# Patient Record
Sex: Female | Born: 1935
Health system: Southern US, Community
[De-identification: ages and names within clinical notes are randomized; demographics above are authoritative.]

## PROBLEM LIST (undated history)

## (undated) DIAGNOSIS — Z803 Family history of malignant neoplasm of breast: Secondary | ICD-10-CM

## (undated) DIAGNOSIS — R0600 Dyspnea, unspecified: Secondary | ICD-10-CM

## (undated) DIAGNOSIS — N182 Chronic kidney disease, stage 2 (mild): Secondary | ICD-10-CM

## (undated) DIAGNOSIS — I10 Essential (primary) hypertension: Secondary | ICD-10-CM

## (undated) DIAGNOSIS — M8580 Other specified disorders of bone density and structure, unspecified site: Secondary | ICD-10-CM

## (undated) DIAGNOSIS — I6529 Occlusion and stenosis of unspecified carotid artery: Secondary | ICD-10-CM

## (undated) DIAGNOSIS — Z8042 Family history of malignant neoplasm of prostate: Secondary | ICD-10-CM

## (undated) DIAGNOSIS — N318 Other neuromuscular dysfunction of bladder: Secondary | ICD-10-CM

## (undated) DIAGNOSIS — M19049 Primary osteoarthritis, unspecified hand: Secondary | ICD-10-CM

## (undated) DIAGNOSIS — I4892 Unspecified atrial flutter: Secondary | ICD-10-CM

## (undated) DIAGNOSIS — G4700 Insomnia, unspecified: Secondary | ICD-10-CM

## (undated) DIAGNOSIS — E7800 Pure hypercholesterolemia, unspecified: Secondary | ICD-10-CM

## (undated) DIAGNOSIS — N3281 Overactive bladder: Secondary | ICD-10-CM

## (undated) DIAGNOSIS — Z7901 Long term (current) use of anticoagulants: Secondary | ICD-10-CM

## (undated) DIAGNOSIS — K5730 Diverticulosis of large intestine without perforation or abscess without bleeding: Secondary | ICD-10-CM

## (undated) DIAGNOSIS — F419 Anxiety disorder, unspecified: Secondary | ICD-10-CM

## (undated) DIAGNOSIS — I340 Nonrheumatic mitral (valve) insufficiency: Secondary | ICD-10-CM

## (undated) HISTORY — DX: Occlusion and stenosis of unspecified carotid artery: I65.29

## (undated) HISTORY — DX: Nonrheumatic mitral (valve) insufficiency: I34.0

## (undated) HISTORY — DX: Long term (current) use of anticoagulants: Z79.01

## (undated) HISTORY — DX: Anxiety disorder, unspecified: F41.9

## (undated) HISTORY — DX: Family history of malignant neoplasm of breast: Z80.3

## (undated) HISTORY — PX: BREAST BIOPSY: SHX20

## (undated) HISTORY — DX: Essential (primary) hypertension: I10

## (undated) HISTORY — DX: Unspecified atrial flutter: I48.92

## (undated) HISTORY — DX: Overactive bladder: N32.81

## (undated) HISTORY — DX: Chronic kidney disease, stage 2 (mild): N18.2

## (undated) HISTORY — DX: Pure hypercholesterolemia, unspecified: E78.00

## (undated) HISTORY — DX: Other specified disorders of bone density and structure, unspecified site: M85.80

## (undated) HISTORY — PX: BREAST LUMPECTOMY: SHX2

## (undated) HISTORY — DX: Insomnia, unspecified: G47.00

## (undated) HISTORY — DX: Family history of malignant neoplasm of prostate: Z80.42

## (undated) HISTORY — DX: Primary osteoarthritis, unspecified hand: M19.049

## (undated) HISTORY — PX: CATARACT EXTRACTION W/ INTRAOCULAR LENS  IMPLANT, BILATERAL: SHX1307

## (undated) HISTORY — DX: Other neuromuscular dysfunction of bladder: N31.8

## (undated) HISTORY — DX: Diverticulosis of large intestine without perforation or abscess without bleeding: K57.30

---

## 1999-02-02 HISTORY — PX: COLONOSCOPY: SHX174

## 1999-05-07 ENCOUNTER — Encounter: Admission: RE | Admit: 1999-05-07 | Discharge: 1999-05-07 | Payer: Self-pay | Admitting: Family Medicine

## 1999-05-07 ENCOUNTER — Encounter: Payer: Self-pay | Admitting: Family Medicine

## 2000-05-12 ENCOUNTER — Encounter: Admission: RE | Admit: 2000-05-12 | Discharge: 2000-05-12 | Payer: Self-pay | Admitting: Family Medicine

## 2000-05-12 ENCOUNTER — Encounter: Payer: Self-pay | Admitting: Family Medicine

## 2000-08-11 ENCOUNTER — Ambulatory Visit (HOSPITAL_COMMUNITY): Admission: RE | Admit: 2000-08-11 | Discharge: 2000-08-11 | Payer: Self-pay | Admitting: Gastroenterology

## 2001-04-11 ENCOUNTER — Other Ambulatory Visit: Admission: RE | Admit: 2001-04-11 | Discharge: 2001-04-11 | Payer: Self-pay | Admitting: Family Medicine

## 2001-05-15 ENCOUNTER — Encounter: Admission: RE | Admit: 2001-05-15 | Discharge: 2001-05-15 | Payer: Self-pay | Admitting: Family Medicine

## 2001-05-15 ENCOUNTER — Encounter: Payer: Self-pay | Admitting: Family Medicine

## 2002-06-20 ENCOUNTER — Other Ambulatory Visit: Admission: RE | Admit: 2002-06-20 | Discharge: 2002-06-20 | Payer: Self-pay | Admitting: Family Medicine

## 2002-07-03 ENCOUNTER — Encounter: Payer: Self-pay | Admitting: Family Medicine

## 2002-07-03 ENCOUNTER — Encounter: Admission: RE | Admit: 2002-07-03 | Discharge: 2002-07-03 | Payer: Self-pay | Admitting: Family Medicine

## 2003-07-19 ENCOUNTER — Encounter: Admission: RE | Admit: 2003-07-19 | Discharge: 2003-07-19 | Payer: Self-pay | Admitting: Family Medicine

## 2004-08-13 ENCOUNTER — Encounter: Admission: RE | Admit: 2004-08-13 | Discharge: 2004-08-13 | Payer: Self-pay | Admitting: Family Medicine

## 2005-08-30 ENCOUNTER — Other Ambulatory Visit: Admission: RE | Admit: 2005-08-30 | Discharge: 2005-08-30 | Payer: Self-pay | Admitting: Family Medicine

## 2005-09-15 ENCOUNTER — Encounter: Admission: RE | Admit: 2005-09-15 | Discharge: 2005-09-15 | Payer: Self-pay | Admitting: Family Medicine

## 2006-09-21 ENCOUNTER — Encounter: Admission: RE | Admit: 2006-09-21 | Discharge: 2006-09-21 | Payer: Self-pay | Admitting: Family Medicine

## 2007-02-03 ENCOUNTER — Emergency Department (HOSPITAL_COMMUNITY): Admission: EM | Admit: 2007-02-03 | Discharge: 2007-02-03 | Payer: Self-pay | Admitting: Emergency Medicine

## 2007-10-04 ENCOUNTER — Encounter: Admission: RE | Admit: 2007-10-04 | Discharge: 2007-10-04 | Payer: Self-pay | Admitting: Family Medicine

## 2008-10-16 ENCOUNTER — Encounter: Admission: RE | Admit: 2008-10-16 | Discharge: 2008-10-16 | Payer: Self-pay | Admitting: Family Medicine

## 2009-10-30 ENCOUNTER — Encounter: Admission: RE | Admit: 2009-10-30 | Discharge: 2009-10-30 | Payer: Self-pay | Admitting: Family Medicine

## 2010-06-19 NOTE — Procedures (Signed)
[place]. [facility]  Patient:    Martha Edwards, Martha Edwards                         MRN: [id number] Proc. Date: 08/11/00 Adm. Date:  [id number] Attending:  Rich Brave CC:         Jethro Bastos, M.D.   Procedure Report  PROCEDURE PERFORMED:  Colonoscopy.  ENDOSCOPIST:  Florencia Reasons, M.D.  INDICATIONS FOR PROCEDURE:  The patient is a 77 year old female with transiently hemoccult positive stool.  FINDINGS:  Normal to the cecum except severe sigmoid diverticulosis.  DESCRIPTION OF PROCEDURE:  The nature, purpose and risks of the procedure had been discussed with the patient, who provided written consent.  The Olympus adjustable tension pediatric video colonoscope was advanced to the cecum without significant difficulty and pull-back was then performed.  The quality of the prep was very good and it is felt that all areas were well seen.  There was severe sigmoid diverticulosis and perhaps some scattered diverticular change elsewhere in the colon.  No polyps, cancer, colitis, vascular malformations were observed and retroflexion in the rectum was normal.  No biopsies were obtained.  The patient tolerated the procedure well and there were no apparent complications.  IMPRESSION:  Normal colonoscopy except for severe sigmoid diverticulosis.  No source of transient hemoccult positivity endoscopically evident.  Note that the patient had possibly been on Advil around the time of her home Hemoccults, one of which was positive.  PLAN:  No further evaluation needed.  Screening flexible sigmoidoscopy in five years. DD:  08/11/00 TD:  08/11/00 Job: [id number]

## 2010-10-21 ENCOUNTER — Other Ambulatory Visit: Payer: Self-pay | Admitting: Family Medicine

## 2010-10-21 DIAGNOSIS — Z1231 Encounter for screening mammogram for malignant neoplasm of breast: Secondary | ICD-10-CM

## 2010-11-12 ENCOUNTER — Ambulatory Visit
Admission: RE | Admit: 2010-11-12 | Discharge: 2010-11-12 | Disposition: A | Discharge status: Home / Self Care | Payer: Medicare Other | Source: Ambulatory Visit | Attending: Family Medicine | Admitting: Family Medicine

## 2010-11-12 DIAGNOSIS — Z1231 Encounter for screening mammogram for malignant neoplasm of breast: Secondary | ICD-10-CM

## 2011-02-15 DIAGNOSIS — E785 Hyperlipidemia, unspecified: Secondary | ICD-10-CM | POA: Diagnosis not present

## 2011-02-15 DIAGNOSIS — Z79899 Other long term (current) drug therapy: Secondary | ICD-10-CM | POA: Diagnosis not present

## 2011-02-15 DIAGNOSIS — N318 Other neuromuscular dysfunction of bladder: Secondary | ICD-10-CM | POA: Diagnosis not present

## 2011-02-15 DIAGNOSIS — Z0000 Encounter for general adult medical examination without abnormal findings: Secondary | ICD-10-CM | POA: Diagnosis not present

## 2011-02-15 DIAGNOSIS — E559 Vitamin D deficiency, unspecified: Secondary | ICD-10-CM | POA: Diagnosis not present

## 2011-02-15 DIAGNOSIS — B370 Candidal stomatitis: Secondary | ICD-10-CM | POA: Diagnosis not present

## 2011-02-15 DIAGNOSIS — M810 Age-related osteoporosis without current pathological fracture: Secondary | ICD-10-CM | POA: Diagnosis not present

## 2011-02-15 DIAGNOSIS — I10 Essential (primary) hypertension: Secondary | ICD-10-CM | POA: Diagnosis not present

## 2011-03-04 DIAGNOSIS — H40019 Open angle with borderline findings, low risk, unspecified eye: Secondary | ICD-10-CM | POA: Diagnosis not present

## 2011-03-18 DIAGNOSIS — M79609 Pain in unspecified limb: Secondary | ICD-10-CM | POA: Diagnosis not present

## 2011-03-18 DIAGNOSIS — IMO0001 Reserved for inherently not codable concepts without codable children: Secondary | ICD-10-CM | POA: Diagnosis not present

## 2011-03-19 ENCOUNTER — Ambulatory Visit
Admission: RE | Admit: 2011-03-19 | Discharge: 2011-03-19 | Disposition: A | Discharge status: Home / Self Care | Payer: Medicare Other | Source: Ambulatory Visit | Attending: Family Medicine | Admitting: Family Medicine

## 2011-03-19 ENCOUNTER — Other Ambulatory Visit: Payer: Self-pay | Admitting: Family Medicine

## 2011-03-19 DIAGNOSIS — M169 Osteoarthritis of hip, unspecified: Secondary | ICD-10-CM | POA: Diagnosis not present

## 2011-03-19 DIAGNOSIS — M79609 Pain in unspecified limb: Secondary | ICD-10-CM

## 2011-03-19 DIAGNOSIS — M25569 Pain in unspecified knee: Secondary | ICD-10-CM | POA: Diagnosis not present

## 2011-04-02 DIAGNOSIS — H5210 Myopia, unspecified eye: Secondary | ICD-10-CM | POA: Diagnosis not present

## 2011-04-02 DIAGNOSIS — H35349 Macular cyst, hole, or pseudohole, unspecified eye: Secondary | ICD-10-CM | POA: Diagnosis not present

## 2011-04-02 DIAGNOSIS — H40059 Ocular hypertension, unspecified eye: Secondary | ICD-10-CM | POA: Diagnosis not present

## 2011-04-02 DIAGNOSIS — H25019 Cortical age-related cataract, unspecified eye: Secondary | ICD-10-CM | POA: Diagnosis not present

## 2011-04-05 DIAGNOSIS — M129 Arthropathy, unspecified: Secondary | ICD-10-CM | POA: Diagnosis not present

## 2011-04-13 DIAGNOSIS — H2589 Other age-related cataract: Secondary | ICD-10-CM | POA: Diagnosis not present

## 2011-04-13 DIAGNOSIS — H25019 Cortical age-related cataract, unspecified eye: Secondary | ICD-10-CM | POA: Diagnosis not present

## 2011-04-13 DIAGNOSIS — H25049 Posterior subcapsular polar age-related cataract, unspecified eye: Secondary | ICD-10-CM | POA: Diagnosis not present

## 2011-04-13 DIAGNOSIS — H2510 Age-related nuclear cataract, unspecified eye: Secondary | ICD-10-CM | POA: Diagnosis not present

## 2011-06-01 DIAGNOSIS — Z1211 Encounter for screening for malignant neoplasm of colon: Secondary | ICD-10-CM | POA: Diagnosis not present

## 2011-06-01 DIAGNOSIS — D126 Benign neoplasm of colon, unspecified: Secondary | ICD-10-CM | POA: Diagnosis not present

## 2011-06-02 DIAGNOSIS — I4892 Unspecified atrial flutter: Secondary | ICD-10-CM | POA: Diagnosis not present

## 2011-06-03 DIAGNOSIS — I4892 Unspecified atrial flutter: Secondary | ICD-10-CM | POA: Diagnosis not present

## 2011-06-09 DIAGNOSIS — I10 Essential (primary) hypertension: Secondary | ICD-10-CM | POA: Diagnosis not present

## 2011-06-09 DIAGNOSIS — I4892 Unspecified atrial flutter: Secondary | ICD-10-CM | POA: Diagnosis not present

## 2011-06-09 DIAGNOSIS — E785 Hyperlipidemia, unspecified: Secondary | ICD-10-CM | POA: Diagnosis not present

## 2011-06-09 DIAGNOSIS — Z7901 Long term (current) use of anticoagulants: Secondary | ICD-10-CM | POA: Diagnosis not present

## 2011-06-10 DIAGNOSIS — I4892 Unspecified atrial flutter: Secondary | ICD-10-CM | POA: Diagnosis not present

## 2011-06-14 DIAGNOSIS — Z7901 Long term (current) use of anticoagulants: Secondary | ICD-10-CM | POA: Diagnosis not present

## 2011-06-14 DIAGNOSIS — I4892 Unspecified atrial flutter: Secondary | ICD-10-CM | POA: Diagnosis not present

## 2011-06-15 DIAGNOSIS — IMO0002 Reserved for concepts with insufficient information to code with codable children: Secondary | ICD-10-CM | POA: Diagnosis not present

## 2011-06-15 DIAGNOSIS — H2510 Age-related nuclear cataract, unspecified eye: Secondary | ICD-10-CM | POA: Diagnosis not present

## 2011-06-15 DIAGNOSIS — H25019 Cortical age-related cataract, unspecified eye: Secondary | ICD-10-CM | POA: Diagnosis not present

## 2011-06-18 DIAGNOSIS — Z7901 Long term (current) use of anticoagulants: Secondary | ICD-10-CM | POA: Diagnosis not present

## 2011-06-18 DIAGNOSIS — I4892 Unspecified atrial flutter: Secondary | ICD-10-CM | POA: Diagnosis not present

## 2011-06-22 DIAGNOSIS — I10 Essential (primary) hypertension: Secondary | ICD-10-CM | POA: Diagnosis not present

## 2011-06-22 DIAGNOSIS — E785 Hyperlipidemia, unspecified: Secondary | ICD-10-CM | POA: Diagnosis not present

## 2011-06-22 DIAGNOSIS — F411 Generalized anxiety disorder: Secondary | ICD-10-CM | POA: Diagnosis not present

## 2011-06-22 DIAGNOSIS — Z7901 Long term (current) use of anticoagulants: Secondary | ICD-10-CM | POA: Diagnosis not present

## 2011-06-22 DIAGNOSIS — I059 Rheumatic mitral valve disease, unspecified: Secondary | ICD-10-CM | POA: Diagnosis not present

## 2011-06-22 DIAGNOSIS — I4892 Unspecified atrial flutter: Secondary | ICD-10-CM | POA: Diagnosis not present

## 2011-06-30 DIAGNOSIS — I4892 Unspecified atrial flutter: Secondary | ICD-10-CM | POA: Diagnosis not present

## 2011-06-30 DIAGNOSIS — Z7901 Long term (current) use of anticoagulants: Secondary | ICD-10-CM | POA: Diagnosis not present

## 2011-07-15 DIAGNOSIS — Z7901 Long term (current) use of anticoagulants: Secondary | ICD-10-CM | POA: Diagnosis not present

## 2011-07-15 DIAGNOSIS — I4892 Unspecified atrial flutter: Secondary | ICD-10-CM | POA: Diagnosis not present

## 2011-08-11 DIAGNOSIS — Z7901 Long term (current) use of anticoagulants: Secondary | ICD-10-CM | POA: Diagnosis not present

## 2011-08-11 DIAGNOSIS — I4892 Unspecified atrial flutter: Secondary | ICD-10-CM | POA: Diagnosis not present

## 2011-08-16 DIAGNOSIS — E785 Hyperlipidemia, unspecified: Secondary | ICD-10-CM | POA: Diagnosis not present

## 2011-08-16 DIAGNOSIS — M19049 Primary osteoarthritis, unspecified hand: Secondary | ICD-10-CM | POA: Diagnosis not present

## 2011-08-16 DIAGNOSIS — I10 Essential (primary) hypertension: Secondary | ICD-10-CM | POA: Diagnosis not present

## 2011-09-15 DIAGNOSIS — I4892 Unspecified atrial flutter: Secondary | ICD-10-CM | POA: Diagnosis not present

## 2011-09-15 DIAGNOSIS — Z7901 Long term (current) use of anticoagulants: Secondary | ICD-10-CM | POA: Diagnosis not present

## 2011-09-29 DIAGNOSIS — Z7901 Long term (current) use of anticoagulants: Secondary | ICD-10-CM | POA: Diagnosis not present

## 2011-09-29 DIAGNOSIS — I4892 Unspecified atrial flutter: Secondary | ICD-10-CM | POA: Diagnosis not present

## 2011-10-13 DIAGNOSIS — E7800 Pure hypercholesterolemia, unspecified: Secondary | ICD-10-CM | POA: Diagnosis not present

## 2011-10-13 DIAGNOSIS — E785 Hyperlipidemia, unspecified: Secondary | ICD-10-CM | POA: Diagnosis not present

## 2011-10-13 DIAGNOSIS — Z79899 Other long term (current) drug therapy: Secondary | ICD-10-CM | POA: Diagnosis not present

## 2011-10-19 DIAGNOSIS — Z7901 Long term (current) use of anticoagulants: Secondary | ICD-10-CM | POA: Diagnosis not present

## 2011-10-19 DIAGNOSIS — I10 Essential (primary) hypertension: Secondary | ICD-10-CM | POA: Diagnosis not present

## 2011-10-19 DIAGNOSIS — I059 Rheumatic mitral valve disease, unspecified: Secondary | ICD-10-CM | POA: Diagnosis not present

## 2011-10-19 DIAGNOSIS — I495 Sick sinus syndrome: Secondary | ICD-10-CM | POA: Diagnosis not present

## 2011-10-19 DIAGNOSIS — I4892 Unspecified atrial flutter: Secondary | ICD-10-CM | POA: Diagnosis not present

## 2011-11-16 DIAGNOSIS — Z7901 Long term (current) use of anticoagulants: Secondary | ICD-10-CM | POA: Diagnosis not present

## 2011-11-16 DIAGNOSIS — I4892 Unspecified atrial flutter: Secondary | ICD-10-CM | POA: Diagnosis not present

## 2011-11-22 ENCOUNTER — Other Ambulatory Visit: Payer: Self-pay | Admitting: Family Medicine

## 2011-11-22 DIAGNOSIS — Z1231 Encounter for screening mammogram for malignant neoplasm of breast: Secondary | ICD-10-CM

## 2011-12-14 DIAGNOSIS — I4892 Unspecified atrial flutter: Secondary | ICD-10-CM | POA: Diagnosis not present

## 2011-12-14 DIAGNOSIS — Z7901 Long term (current) use of anticoagulants: Secondary | ICD-10-CM | POA: Diagnosis not present

## 2011-12-23 ENCOUNTER — Ambulatory Visit
Admission: RE | Admit: 2011-12-23 | Discharge: 2011-12-23 | Disposition: A | Discharge status: Home / Self Care | Payer: Medicare Other | Source: Ambulatory Visit | Attending: Family Medicine | Admitting: Family Medicine

## 2011-12-23 DIAGNOSIS — Z1231 Encounter for screening mammogram for malignant neoplasm of breast: Secondary | ICD-10-CM

## 2012-01-03 DIAGNOSIS — Z7901 Long term (current) use of anticoagulants: Secondary | ICD-10-CM | POA: Diagnosis not present

## 2012-01-03 DIAGNOSIS — N390 Urinary tract infection, site not specified: Secondary | ICD-10-CM | POA: Diagnosis not present

## 2012-01-03 DIAGNOSIS — R319 Hematuria, unspecified: Secondary | ICD-10-CM | POA: Diagnosis not present

## 2012-01-10 DIAGNOSIS — Z7901 Long term (current) use of anticoagulants: Secondary | ICD-10-CM | POA: Diagnosis not present

## 2012-01-24 DIAGNOSIS — Z7901 Long term (current) use of anticoagulants: Secondary | ICD-10-CM | POA: Diagnosis not present

## 2012-01-24 DIAGNOSIS — I4892 Unspecified atrial flutter: Secondary | ICD-10-CM | POA: Diagnosis not present

## 2012-02-21 DIAGNOSIS — M810 Age-related osteoporosis without current pathological fracture: Secondary | ICD-10-CM | POA: Diagnosis not present

## 2012-02-21 DIAGNOSIS — I4892 Unspecified atrial flutter: Secondary | ICD-10-CM | POA: Diagnosis not present

## 2012-02-21 DIAGNOSIS — N318 Other neuromuscular dysfunction of bladder: Secondary | ICD-10-CM | POA: Diagnosis not present

## 2012-02-21 DIAGNOSIS — Z7901 Long term (current) use of anticoagulants: Secondary | ICD-10-CM | POA: Diagnosis not present

## 2012-02-21 DIAGNOSIS — Z79899 Other long term (current) drug therapy: Secondary | ICD-10-CM | POA: Diagnosis not present

## 2012-02-21 DIAGNOSIS — Z0000 Encounter for general adult medical examination without abnormal findings: Secondary | ICD-10-CM | POA: Diagnosis not present

## 2012-02-21 DIAGNOSIS — I10 Essential (primary) hypertension: Secondary | ICD-10-CM | POA: Diagnosis not present

## 2012-02-21 DIAGNOSIS — E785 Hyperlipidemia, unspecified: Secondary | ICD-10-CM | POA: Diagnosis not present

## 2012-03-20 DIAGNOSIS — I4892 Unspecified atrial flutter: Secondary | ICD-10-CM | POA: Diagnosis not present

## 2012-03-20 DIAGNOSIS — Z7901 Long term (current) use of anticoagulants: Secondary | ICD-10-CM | POA: Diagnosis not present

## 2012-05-03 DIAGNOSIS — Z7901 Long term (current) use of anticoagulants: Secondary | ICD-10-CM | POA: Diagnosis not present

## 2012-05-03 DIAGNOSIS — I4892 Unspecified atrial flutter: Secondary | ICD-10-CM | POA: Diagnosis not present

## 2012-05-04 DIAGNOSIS — I059 Rheumatic mitral valve disease, unspecified: Secondary | ICD-10-CM | POA: Diagnosis not present

## 2012-05-04 DIAGNOSIS — I4892 Unspecified atrial flutter: Secondary | ICD-10-CM | POA: Diagnosis not present

## 2012-05-04 DIAGNOSIS — Z7901 Long term (current) use of anticoagulants: Secondary | ICD-10-CM | POA: Diagnosis not present

## 2012-05-31 DIAGNOSIS — Z7901 Long term (current) use of anticoagulants: Secondary | ICD-10-CM | POA: Diagnosis not present

## 2012-05-31 DIAGNOSIS — I4892 Unspecified atrial flutter: Secondary | ICD-10-CM | POA: Diagnosis not present

## 2012-06-28 DIAGNOSIS — I4892 Unspecified atrial flutter: Secondary | ICD-10-CM | POA: Diagnosis not present

## 2012-06-28 DIAGNOSIS — Z7901 Long term (current) use of anticoagulants: Secondary | ICD-10-CM | POA: Diagnosis not present

## 2012-07-14 DIAGNOSIS — H35349 Macular cyst, hole, or pseudohole, unspecified eye: Secondary | ICD-10-CM | POA: Diagnosis not present

## 2012-07-14 DIAGNOSIS — H40059 Ocular hypertension, unspecified eye: Secondary | ICD-10-CM | POA: Diagnosis not present

## 2012-07-14 DIAGNOSIS — Z961 Presence of intraocular lens: Secondary | ICD-10-CM | POA: Diagnosis not present

## 2012-07-20 DIAGNOSIS — R1013 Epigastric pain: Secondary | ICD-10-CM | POA: Diagnosis not present

## 2012-07-20 DIAGNOSIS — N390 Urinary tract infection, site not specified: Secondary | ICD-10-CM | POA: Diagnosis not present

## 2012-07-20 DIAGNOSIS — B370 Candidal stomatitis: Secondary | ICD-10-CM | POA: Diagnosis not present

## 2012-08-03 DIAGNOSIS — Z7901 Long term (current) use of anticoagulants: Secondary | ICD-10-CM | POA: Diagnosis not present

## 2012-08-03 DIAGNOSIS — I4892 Unspecified atrial flutter: Secondary | ICD-10-CM | POA: Diagnosis not present

## 2012-08-17 DIAGNOSIS — Z7901 Long term (current) use of anticoagulants: Secondary | ICD-10-CM | POA: Diagnosis not present

## 2012-08-17 DIAGNOSIS — I4892 Unspecified atrial flutter: Secondary | ICD-10-CM | POA: Diagnosis not present

## 2012-09-01 DIAGNOSIS — E785 Hyperlipidemia, unspecified: Secondary | ICD-10-CM | POA: Diagnosis not present

## 2012-09-01 DIAGNOSIS — M810 Age-related osteoporosis without current pathological fracture: Secondary | ICD-10-CM | POA: Diagnosis not present

## 2012-09-01 DIAGNOSIS — I10 Essential (primary) hypertension: Secondary | ICD-10-CM | POA: Diagnosis not present

## 2012-09-19 DIAGNOSIS — I4892 Unspecified atrial flutter: Secondary | ICD-10-CM | POA: Diagnosis not present

## 2012-09-19 DIAGNOSIS — Z7901 Long term (current) use of anticoagulants: Secondary | ICD-10-CM | POA: Diagnosis not present

## 2012-10-10 DIAGNOSIS — Z7901 Long term (current) use of anticoagulants: Secondary | ICD-10-CM | POA: Diagnosis not present

## 2012-10-10 DIAGNOSIS — I4892 Unspecified atrial flutter: Secondary | ICD-10-CM | POA: Diagnosis not present

## 2012-11-09 ENCOUNTER — Ambulatory Visit (INDEPENDENT_AMBULATORY_CARE_PROVIDER_SITE_OTHER): Payer: Medicare Other | Admitting: Pharmacist

## 2012-11-09 DIAGNOSIS — I4891 Unspecified atrial fibrillation: Secondary | ICD-10-CM | POA: Diagnosis not present

## 2012-11-23 ENCOUNTER — Other Ambulatory Visit: Payer: Self-pay

## 2012-11-23 DIAGNOSIS — Z1231 Encounter for screening mammogram for malignant neoplasm of breast: Secondary | ICD-10-CM

## 2012-12-21 ENCOUNTER — Ambulatory Visit (INDEPENDENT_AMBULATORY_CARE_PROVIDER_SITE_OTHER): Payer: Medicare Other | Admitting: Pharmacist

## 2012-12-21 DIAGNOSIS — I4891 Unspecified atrial fibrillation: Secondary | ICD-10-CM | POA: Diagnosis not present

## 2012-12-21 MED ORDER — WARFARIN SODIUM 4 MG PO TABS: ORAL_TABLET | ORAL | Status: DC

## 2012-12-25 ENCOUNTER — Ambulatory Visit
Admission: RE | Admit: 2012-12-25 | Discharge: 2012-12-25 | Disposition: A | Discharge status: Home / Self Care | Payer: Medicare Other | Source: Ambulatory Visit

## 2012-12-25 DIAGNOSIS — Z1231 Encounter for screening mammogram for malignant neoplasm of breast: Secondary | ICD-10-CM | POA: Diagnosis not present

## 2013-01-18 ENCOUNTER — Ambulatory Visit (INDEPENDENT_AMBULATORY_CARE_PROVIDER_SITE_OTHER): Payer: Medicare Other | Admitting: *Deleted

## 2013-01-18 DIAGNOSIS — I4891 Unspecified atrial fibrillation: Secondary | ICD-10-CM

## 2013-02-15 ENCOUNTER — Ambulatory Visit (INDEPENDENT_AMBULATORY_CARE_PROVIDER_SITE_OTHER): Payer: Medicare Other | Admitting: Pharmacist

## 2013-02-15 DIAGNOSIS — I4891 Unspecified atrial fibrillation: Secondary | ICD-10-CM

## 2013-02-15 LAB — POCT INR: INR: 2.9

## 2013-03-06 ENCOUNTER — Encounter: Payer: Self-pay | Admitting: Interventional Cardiology

## 2013-03-12 ENCOUNTER — Telehealth: Payer: Self-pay | Admitting: Pharmacist

## 2013-03-12 MED ORDER — WARFARIN SODIUM 4 MG PO TABS: ORAL_TABLET | ORAL | Status: DC

## 2013-03-12 NOTE — Telephone Encounter (Signed)
Refill for warfarin sent to [facility].

## 2013-03-22 ENCOUNTER — Encounter: Payer: Self-pay | Admitting: Interventional Cardiology

## 2013-04-02 DIAGNOSIS — Z79899 Other long term (current) drug therapy: Secondary | ICD-10-CM | POA: Diagnosis not present

## 2013-04-02 DIAGNOSIS — Z23 Encounter for immunization: Secondary | ICD-10-CM | POA: Diagnosis not present

## 2013-04-02 DIAGNOSIS — G479 Sleep disorder, unspecified: Secondary | ICD-10-CM | POA: Diagnosis not present

## 2013-04-02 DIAGNOSIS — Z0000 Encounter for general adult medical examination without abnormal findings: Secondary | ICD-10-CM | POA: Diagnosis not present

## 2013-04-02 DIAGNOSIS — I4892 Unspecified atrial flutter: Secondary | ICD-10-CM | POA: Diagnosis not present

## 2013-04-02 DIAGNOSIS — N182 Chronic kidney disease, stage 2 (mild): Secondary | ICD-10-CM | POA: Diagnosis not present

## 2013-04-02 DIAGNOSIS — N318 Other neuromuscular dysfunction of bladder: Secondary | ICD-10-CM | POA: Diagnosis not present

## 2013-04-02 DIAGNOSIS — E785 Hyperlipidemia, unspecified: Secondary | ICD-10-CM | POA: Diagnosis not present

## 2013-04-04 ENCOUNTER — Ambulatory Visit (INDEPENDENT_AMBULATORY_CARE_PROVIDER_SITE_OTHER): Payer: Medicare Other | Admitting: Pharmacist

## 2013-04-04 DIAGNOSIS — I4891 Unspecified atrial fibrillation: Secondary | ICD-10-CM

## 2013-04-04 LAB — POCT INR: INR: 2.2

## 2013-05-02 ENCOUNTER — Ambulatory Visit: Payer: Medicare Other | Admitting: Interventional Cardiology

## 2013-05-17 ENCOUNTER — Ambulatory Visit: Payer: Medicare Other | Admitting: Interventional Cardiology

## 2013-05-17 ENCOUNTER — Ambulatory Visit (INDEPENDENT_AMBULATORY_CARE_PROVIDER_SITE_OTHER): Payer: Medicare Other | Admitting: *Deleted

## 2013-05-17 DIAGNOSIS — I4891 Unspecified atrial fibrillation: Secondary | ICD-10-CM

## 2013-05-17 LAB — POCT INR: INR: 2.7

## 2013-06-28 ENCOUNTER — Ambulatory Visit (INDEPENDENT_AMBULATORY_CARE_PROVIDER_SITE_OTHER): Payer: Medicare Other | Admitting: Pharmacist Clinician (PhC)/ Clinical Pharmacy Specialist

## 2013-06-28 DIAGNOSIS — I4891 Unspecified atrial fibrillation: Secondary | ICD-10-CM

## 2013-06-28 LAB — POCT INR: INR: 2.9

## 2013-07-03 ENCOUNTER — Encounter: Payer: Self-pay | Admitting: Interventional Cardiology

## 2013-07-06 DIAGNOSIS — H35349 Macular cyst, hole, or pseudohole, unspecified eye: Secondary | ICD-10-CM | POA: Diagnosis not present

## 2013-07-06 DIAGNOSIS — Z961 Presence of intraocular lens: Secondary | ICD-10-CM | POA: Diagnosis not present

## 2013-07-19 ENCOUNTER — Ambulatory Visit: Payer: Medicare Other | Admitting: Interventional Cardiology

## 2013-08-02 DIAGNOSIS — N3000 Acute cystitis without hematuria: Secondary | ICD-10-CM | POA: Diagnosis not present

## 2013-08-02 DIAGNOSIS — R300 Dysuria: Secondary | ICD-10-CM | POA: Diagnosis not present

## 2013-08-09 ENCOUNTER — Ambulatory Visit (INDEPENDENT_AMBULATORY_CARE_PROVIDER_SITE_OTHER): Payer: Medicare Other | Admitting: Pharmacist

## 2013-08-09 DIAGNOSIS — I4891 Unspecified atrial fibrillation: Secondary | ICD-10-CM | POA: Diagnosis not present

## 2013-08-09 LAB — POCT INR: INR: 4.5

## 2013-08-20 ENCOUNTER — Ambulatory Visit (INDEPENDENT_AMBULATORY_CARE_PROVIDER_SITE_OTHER): Payer: Medicare Other

## 2013-08-20 DIAGNOSIS — I4891 Unspecified atrial fibrillation: Secondary | ICD-10-CM

## 2013-08-20 LAB — POCT INR: INR: 2.4

## 2013-08-28 ENCOUNTER — Encounter: Payer: Self-pay | Admitting: Interventional Cardiology

## 2013-08-28 ENCOUNTER — Ambulatory Visit (INDEPENDENT_AMBULATORY_CARE_PROVIDER_SITE_OTHER): Payer: Medicare Other | Admitting: Interventional Cardiology

## 2013-08-28 VITALS — BP 160/82 | HR 53 | Ht 67.0 in | Wt 151.0 lb

## 2013-08-28 DIAGNOSIS — Z7901 Long term (current) use of anticoagulants: Secondary | ICD-10-CM | POA: Diagnosis not present

## 2013-08-28 DIAGNOSIS — I10 Essential (primary) hypertension: Secondary | ICD-10-CM

## 2013-08-28 DIAGNOSIS — I4892 Unspecified atrial flutter: Secondary | ICD-10-CM

## 2013-08-28 NOTE — Progress Notes (Signed)
Patient ID: Martha Edwards, female   DOB: 06/10/1932, 80 y.o.   MRN: [id number]    [address] [place], [place]  [address] Phone: [phone] Fax:  [phone]  Date:  08/28/2013   ID:  Martha Edwards, DOB [birth year], MRN [id number]  PCP:  No primary provider on file.   ASSESSMENT:  1. Proximal atrial flutter, currently in sinus rhythm/sinus bradycardia 2. Chronic anticoagulation therapy without bleeding 3. Hypertension with borderline control  PLAN:  1. decrease salt in diet 2. Decrease weight 3. Clinical followup in 6-8 months  SUBJECTIVE: Martha Edwards is a 80 y.o. female who has no cardiovascular complaints. She has not had bleeding on Coumadin. No falls or head trauma. No transient neurological complaints. She denies dyspnea chest pain.   Wt Readings from Last 3 Encounters:  08/28/13 151 lb (68.493 kg)     Past Medical History  Diagnosis Date  . Anxiety   . Hypercholesterolemia   . Diverticula, colon   . Overactive bladder   . Osteopenia     > osteoporosis  . Hypertension     with LVH on echo 06/03/11  . Carotid artery plaque     mild bilateral carotid plaque without significant stenosis (<20% in left ICA), 04/2010  . Atrial flutter     with LVEF 60%, LVH, LAE and holter with rate control and Ave HR 60 bpm   . Hypertonicity of bladder   . Insomnia   . Anticoagulated   . Mitral regurgitation   . Arthritis of hand   . CKD (chronic kidney disease) stage 2, GFR 60-89 ml/min     Current Outpatient Prescriptions  Medication Sig Dispense Refill  . acetaminophen (TYLENOL) 325 MG tablet Take 650 mg by mouth as directed. Tablet 2 tablets as needed      . atorvastatin (LIPITOR) 40 MG tablet Take 40 mg by mouth daily.      . Cholecalciferol (VITAMIN D PO) Take by mouth. 800 UNIT Capsule 1 capsule Once a day      . METOPROLOL SUCCINATE ER PO Take by mouth. Metoprolol Succinate 25 MG Tablet Extended Release 24 Hour 1/2 tablet Once a day      .  sulfamethoxazole-trimethoprim (BACTRIM DS) 800-160 MG per tablet       . warfarin (COUMADIN) 4 MG tablet Take 1 and 1/2 tablets by mouth all days of the week or as directed.  135 tablet  1  . Zolpidem Tartrate (AMBIEN PO) Take by mouth. 10 MG Tablet 1/2-1 tablet at bedtime/ as needed       No current facility-administered medications for this visit.    Allergies:   No Known Allergies  Social History:  The patient  reports that she has quit smoking. She does not have any smokeless tobacco history on file.   ROS:  Please see the history of present illness.   No edema. No palpitations. No chest pain.   All other systems reviewed and negative.   OBJECTIVE: VS:  BP 160/82  Pulse 53  Ht 5\' 7"  (1.702 m)  Wt 151 lb (68.493 kg)  BMI 23.64 kg/m2 Well nourished, well developed, in no acute distress, younger than stated age 85: normal Neck: JVD flat. Carotid bruit absent  Cardiac:  normal S1, S2; RRR; no murmur Lungs:  clear to auscultation bilaterally, no wheezing, rhonchi or rales Abd: soft, nontender, no hepatomegaly Ext: Edema none. Pulses 2+ Skin: warm and dry Neuro:  CNs 2-12 intact, no focal  abnormalities noted  EKG:  Normal sinus rhythm/sinus bradycardia with left atrial       Signed, Illene Labrador III, MD 08/28/2013 11:28 AM

## 2013-08-28 NOTE — Patient Instructions (Signed)
Your physician recommends that you continue on your current medications as directed. Please refer to the Current Medication list given to you today.  Your physician wants you to follow-up in: 6-8 months with Dr.Smith You will receive a reminder letter in the mail two months in advance. If you don't receive a letter, please call our office to schedule the follow-up appointment.

## 2013-08-30 DIAGNOSIS — I4892 Unspecified atrial flutter: Secondary | ICD-10-CM | POA: Insufficient documentation

## 2013-08-30 DIAGNOSIS — I10 Essential (primary) hypertension: Secondary | ICD-10-CM | POA: Insufficient documentation

## 2013-08-30 DIAGNOSIS — Z7901 Long term (current) use of anticoagulants: Secondary | ICD-10-CM | POA: Insufficient documentation

## 2013-09-10 ENCOUNTER — Ambulatory Visit (INDEPENDENT_AMBULATORY_CARE_PROVIDER_SITE_OTHER): Payer: Medicare Other | Admitting: Pharmacist

## 2013-09-10 DIAGNOSIS — I4891 Unspecified atrial fibrillation: Secondary | ICD-10-CM

## 2013-09-10 LAB — POCT INR: INR: 2.8

## 2013-09-23 ENCOUNTER — Other Ambulatory Visit: Payer: Self-pay | Admitting: Interventional Cardiology

## 2013-09-24 ENCOUNTER — Other Ambulatory Visit: Payer: Self-pay | Admitting: Pharmacist

## 2013-09-24 MED ORDER — WARFARIN SODIUM 4 MG PO TABS: ORAL_TABLET | ORAL | Status: DC

## 2013-10-09 ENCOUNTER — Ambulatory Visit (INDEPENDENT_AMBULATORY_CARE_PROVIDER_SITE_OTHER): Payer: Medicare Other | Admitting: *Deleted

## 2013-10-09 DIAGNOSIS — I4891 Unspecified atrial fibrillation: Secondary | ICD-10-CM | POA: Diagnosis not present

## 2013-10-09 LAB — POCT INR: INR: 2.7

## 2013-11-06 ENCOUNTER — Ambulatory Visit (INDEPENDENT_AMBULATORY_CARE_PROVIDER_SITE_OTHER): Payer: Medicare Other | Admitting: *Deleted

## 2013-11-06 DIAGNOSIS — I4891 Unspecified atrial fibrillation: Secondary | ICD-10-CM

## 2013-11-06 LAB — POCT INR: INR: 2.3

## 2013-11-20 ENCOUNTER — Other Ambulatory Visit: Payer: Self-pay

## 2013-11-20 DIAGNOSIS — Z1231 Encounter for screening mammogram for malignant neoplasm of breast: Secondary | ICD-10-CM

## 2013-12-18 ENCOUNTER — Ambulatory Visit (INDEPENDENT_AMBULATORY_CARE_PROVIDER_SITE_OTHER): Payer: Medicare Other

## 2013-12-18 DIAGNOSIS — I4891 Unspecified atrial fibrillation: Secondary | ICD-10-CM | POA: Diagnosis not present

## 2013-12-18 LAB — POCT INR: INR: 2.6

## 2013-12-31 ENCOUNTER — Ambulatory Visit
Admission: RE | Admit: 2013-12-31 | Discharge: 2013-12-31 | Disposition: A | Discharge status: Home / Self Care | Payer: Medicare Other | Source: Ambulatory Visit

## 2013-12-31 ENCOUNTER — Encounter (INDEPENDENT_AMBULATORY_CARE_PROVIDER_SITE_OTHER): Payer: Self-pay

## 2013-12-31 DIAGNOSIS — Z1231 Encounter for screening mammogram for malignant neoplasm of breast: Secondary | ICD-10-CM | POA: Diagnosis not present

## 2014-01-02 ENCOUNTER — Other Ambulatory Visit: Payer: Self-pay | Admitting: Family Medicine

## 2014-01-02 DIAGNOSIS — R928 Other abnormal and inconclusive findings on diagnostic imaging of breast: Secondary | ICD-10-CM

## 2014-01-16 ENCOUNTER — Ambulatory Visit
Admission: RE | Admit: 2014-01-16 | Discharge: 2014-01-16 | Disposition: A | Discharge status: Home / Self Care | Payer: Medicare Other | Source: Ambulatory Visit | Attending: Family Medicine | Admitting: Family Medicine

## 2014-01-16 DIAGNOSIS — R928 Other abnormal and inconclusive findings on diagnostic imaging of breast: Secondary | ICD-10-CM

## 2014-01-16 DIAGNOSIS — R922 Inconclusive mammogram: Secondary | ICD-10-CM | POA: Diagnosis not present

## 2014-01-29 ENCOUNTER — Ambulatory Visit (INDEPENDENT_AMBULATORY_CARE_PROVIDER_SITE_OTHER): Payer: Medicare Other | Admitting: *Deleted

## 2014-01-29 DIAGNOSIS — I4891 Unspecified atrial fibrillation: Secondary | ICD-10-CM | POA: Diagnosis not present

## 2014-01-29 LAB — POCT INR: INR: 3.3

## 2014-01-30 ENCOUNTER — Other Ambulatory Visit: Payer: Self-pay | Admitting: Interventional Cardiology

## 2014-02-26 ENCOUNTER — Ambulatory Visit: Payer: Medicare Other | Admitting: Interventional Cardiology

## 2014-03-06 ENCOUNTER — Ambulatory Visit (INDEPENDENT_AMBULATORY_CARE_PROVIDER_SITE_OTHER): Payer: Medicare Other | Admitting: *Deleted

## 2014-03-06 ENCOUNTER — Encounter: Payer: Self-pay | Admitting: Physician Assistant

## 2014-03-06 ENCOUNTER — Ambulatory Visit (INDEPENDENT_AMBULATORY_CARE_PROVIDER_SITE_OTHER): Payer: Medicare Other | Admitting: Physician Assistant

## 2014-03-06 VITALS — BP 170/92 | HR 43 | Ht 67.0 in | Wt 152.0 lb

## 2014-03-06 DIAGNOSIS — I4891 Unspecified atrial fibrillation: Secondary | ICD-10-CM | POA: Diagnosis not present

## 2014-03-06 DIAGNOSIS — I10 Essential (primary) hypertension: Secondary | ICD-10-CM

## 2014-03-06 DIAGNOSIS — I4892 Unspecified atrial flutter: Secondary | ICD-10-CM | POA: Diagnosis not present

## 2014-03-06 DIAGNOSIS — N189 Chronic kidney disease, unspecified: Secondary | ICD-10-CM

## 2014-03-06 LAB — POCT INR: INR: 2.4

## 2014-03-06 MED ORDER — AMLODIPINE BESYLATE 2.5 MG PO TABS: 2.5000 mg | ORAL_TABLET | Freq: Every day | ORAL | Status: DC

## 2014-03-06 NOTE — Patient Instructions (Addendum)
Your physician has recommended you make the following change in your medication:  Start Norvasc (Amlodipine) 2.5 mg once daily.  Schedule follow up with Richardson Dopp, PA-C ON 04/04/14 @ 12:10  YOUR COUMADIN APPT IS NOW 04/04/14 @ 1:30  Schedule follow up with Dr. Daneen Schick in 6 months.

## 2014-03-06 NOTE — Progress Notes (Signed)
Cardiology Office Note   Date:  03/06/2014   ID:  Martha Edwards, DOB [birth year], MRN [id number]  PCP:  Lilian Coma, MD  Cardiologist:  Dr. Daneen Schick     Chief Complaint  Patient presents with  . Hypertension  . Atrial Flutter     History of Present Illness: Martha Edwards is a 81 y.o. female with a hx of paroxysmal atrial flutter, HTN, HL, minimal carotid plaque by previous Doppler, CKD, mitral regurgitation.  Records indicate EF 60% by echo in 2013.  I do not have the report to review.  Last seen by Dr. Daneen Schick 08/2013.    She returns for follow-up. She gets short of breath with more extreme exertion. She is NYHA 2-2b. She denies chest pain, orthopnea, PND, edema, syncope.   Past Medical History  Diagnosis Date  . Anxiety   . Hypercholesterolemia   . Diverticula, colon   . Overactive bladder   . Osteopenia     > osteoporosis  . Hypertension     with LVH on echo 06/03/11  . Carotid artery plaque     mild bilateral carotid plaque without significant stenosis (<20% in left ICA), 04/2010  . Atrial flutter     with LVEF 60%, LVH, LAE and holter with rate control and Ave HR 60 bpm   . Hypertonicity of bladder   . Insomnia   . Anticoagulated   . Mitral regurgitation   . Arthritis of hand   . CKD (chronic kidney disease) stage 2, GFR 60-89 ml/min     Past Surgical History  Procedure Laterality Date  . Colonoscopy  2001    2007 sigmoidoscopy     Current Outpatient Prescriptions  Medication Sig Dispense Refill  . acetaminophen (TYLENOL) 325 MG tablet Take 650 mg by mouth as directed. Tablet 2 tablets as needed    . atorvastatin (LIPITOR) 40 MG tablet Take 40 mg by mouth daily.    . Cholecalciferol (VITAMIN D PO) Take by mouth. 800 UNIT Capsule 1 capsule Once a day    . METOPROLOL SUCCINATE ER PO Take by mouth. Metoprolol Succinate 25 MG Tablet Extended Release 24 Hour 1/2 tablet Once a day    . sulfamethoxazole-trimethoprim (BACTRIM DS) 800-160 MG per tablet       . warfarin (COUMADIN) 4 MG tablet TAKE 1 AND 1/2 TABLETS BY MOUTH ALL DAYS OF THE WEEK OR AS DIRECTED. 45 tablet 3  . Zolpidem Tartrate (AMBIEN PO) Take by mouth. 10 MG Tablet 1/2-1 tablet at bedtime/ as needed     No current facility-administered medications for this visit.    Allergies:   Review of patient's allergies indicates no known allergies.    Social History:  The patient  reports that she has quit smoking. She does not have any smokeless tobacco history on file.   Family History:  The patient's family history includes Breast cancer in her sister; Diabetes in her mother; Liver cancer in her mother; Prostate cancer in her brother. There is no history of Heart attack or Stroke.    ROS:  Please see the history of present illness.   Otherwise, review of systems are positive for cold intolerance.   All other systems are reviewed and negative.    PHYSICAL EXAM: VS:  BP 170/92 mmHg  Pulse 43  Ht 5\' 7"  (1.702 m)  Wt 152 lb (68.947 kg)  BMI 23.80 kg/m2    Wt Readings from Last 3 Encounters:  08/28/13 151 lb (68.493 kg)  GEN: Well nourished, well developed, in no acute distress HEENT: normal Neck: no JVD, no carotid bruits, no masses Cardiac:  Normal S1/S2, RRR; no murmur, no rubs or gallops, no edema  Respiratory:  clear to auscultation bilaterally, no wheezing, rhonchi or rales. GI: soft, nontender, nondistended, + BS MS: no deformity or atrophy Skin: warm and dry  Neuro:  CNs II-XII intact, Strength and sensation are intact Psych: Normal affect   EKG:  EKG is ordered today.  It demonstrates:    Sinus bradycardia, HR 43, normal axis, first-degree AV block (PR interval 230 ms), QTc 400 ms, no change from prior tracing    Recent Labs: No results found for requested labs within last 365 days.    Lipid Panel No results found for: CHOL, TRIG, HDL, CHOLHDL, VLDL, LDLCALC, LDLDIRECT    ASSESSMENT AND PLAN:  1.  Paroxysmal Atrial Flutter:  Maintaining NSR.  She is  asymptomatic with her bradycardia.  She is tolerating Coumadin. Continue current Rx.  2.  Hypertension:  BP is too high.  She admits to dietary indiscretion with salt.      -  Add Amlodipine 2.5 mg daily.    -  Limit dietary salt. 3.  Chronic Kidney Disease:  Request last set of labs from PCP.    Current medicines are reviewed at length with the patient today.  The patient does not have concerns regarding medicines.  The following changes have been made:  As above.   Labs/ tests ordered today include:   Orders Placed This Encounter  Procedures  . EKG 12-Lead    Disposition:   FU with me in 1 month and Dr. Daneen Schick in 6 mos.   Signed, Versie Starks, MHS 03/06/2014 2:10 PM    [facility] [place], [place], [place]  [address] Phone: [phone]; Fax: [phone]

## 2014-04-04 ENCOUNTER — Ambulatory Visit (INDEPENDENT_AMBULATORY_CARE_PROVIDER_SITE_OTHER): Payer: Medicare Other | Admitting: *Deleted

## 2014-04-04 ENCOUNTER — Ambulatory Visit (INDEPENDENT_AMBULATORY_CARE_PROVIDER_SITE_OTHER): Payer: Medicare Other | Admitting: Physician Assistant

## 2014-04-04 ENCOUNTER — Encounter: Payer: Self-pay | Admitting: Physician Assistant

## 2014-04-04 VITALS — BP 140/66 | HR 44 | Ht 67.0 in | Wt 149.0 lb

## 2014-04-04 DIAGNOSIS — I10 Essential (primary) hypertension: Secondary | ICD-10-CM

## 2014-04-04 DIAGNOSIS — I4891 Unspecified atrial fibrillation: Secondary | ICD-10-CM | POA: Diagnosis not present

## 2014-04-04 DIAGNOSIS — I4892 Unspecified atrial flutter: Secondary | ICD-10-CM

## 2014-04-04 LAB — POCT INR: INR: 3.1

## 2014-04-04 NOTE — Patient Instructions (Signed)
Your physician recommends that you continue on your current medications as directed. Please refer to the Current Medication list given to you today.  Your physician wants you to follow-up in: 6 months with Dr.Smith You will receive a reminder letter in the mail two months in advance. If you don't receive a letter, please call our office to schedule the follow-up appointment.  

## 2014-04-04 NOTE — Progress Notes (Signed)
Cardiology Office Note   Date:  04/04/2014   ID:  Martha Edwards, DOB 10-28-1932, MRN [id number]  PCP:  Lilian Coma, MD  Cardiologist:  Dr. Daneen Schick     Chief Complaint  Patient presents with  . Hypertension    follow up     History of Present Illness: Martha Edwards is a 81 y.o. female with a hx of paroxysmal atrial flutter, HTN, HL, minimal carotid plaque by previous Doppler, CKD, mitral regurgitation.  Records indicate EF 60% by echo in 2013.  I do not have the report to review.    I saw her last month.  Her BP was too high and I added Amlodipine.  She returns for FU.  She is doing well.  The patient denies chest pain, shortness of breath, syncope, orthopnea, PND or significant pedal edema.  She is tolerating her medications.   Past Medical History  Diagnosis Date  . Anxiety   . Hypercholesterolemia   . Diverticula, colon   . Overactive bladder   . Osteopenia     > osteoporosis  . Hypertension     with LVH on echo 06/03/11  . Carotid artery plaque     mild bilateral carotid plaque without significant stenosis (<20% in left ICA), 04/2010  . Atrial flutter     with LVEF 60%, LVH, LAE and holter with rate control and Ave HR 60 bpm   . Hypertonicity of bladder   . Insomnia   . Anticoagulated   . Mitral regurgitation   . Arthritis of hand   . CKD (chronic kidney disease) stage 2, GFR 60-89 ml/min     Past Surgical History  Procedure Laterality Date  . Colonoscopy  2001    2007 sigmoidoscopy     Current Outpatient Prescriptions  Medication Sig Dispense Refill  . acetaminophen (TYLENOL) 325 MG tablet Take 650 mg by mouth as directed. Tablet 2 tablets as needed    . amLODipine (NORVASC) 2.5 MG tablet Take 1 tablet (2.5 mg total) by mouth daily. 30 tablet 11  . atorvastatin (LIPITOR) 40 MG tablet Take 40 mg by mouth daily.    . Cholecalciferol (VITAMIN D PO) Take by mouth. 800 UNIT Capsule 1 capsule Once a day    . METOPROLOL SUCCINATE ER PO Take by mouth.  Metoprolol Succinate 25 MG Tablet Extended Release 24 Hour 1/2 tablet Once a day    . warfarin (COUMADIN) 4 MG tablet TAKE 1 AND 1/2 TABLETS BY MOUTH ALL DAYS OF THE WEEK OR AS DIRECTED. 45 tablet 3  . Zolpidem Tartrate (AMBIEN PO) Take by mouth. 10 MG Tablet 1/2-1 tablet at bedtime/ as needed     No current facility-administered medications for this visit.    Allergies:   Review of patient's allergies indicates no known allergies.    Social History:  The patient  reports that she has quit smoking. She does not have any smokeless tobacco history on file.   Family History:  The patient's family history includes Breast cancer in her sister; Diabetes in her mother; Liver cancer in her mother; Prostate cancer in her brother. There is no history of Heart attack or Stroke.    ROS:  Please see the history of present illness.    ROS   PHYSICAL EXAM: VS:  BP 140/66 mmHg  Pulse 44  Ht 5\' 7"  (1.702 m)  Wt 149 lb (67.586 kg)  BMI 23.33 kg/m2  SpO2 97%    Wt Readings from Last 3 Encounters:  04/04/14 149 lb (67.586 kg)  03/06/14 152 lb (68.947 kg)  08/28/13 151 lb (68.493 kg)     GEN: Well nourished, well developed, in no acute distress HEENT: normal Neck: no JVD, no carotid bruits, no masses Cardiac:  Normal S1/S2, RRR; no murmur, no rubs or gallops, no edema  Respiratory:  clear to auscultation bilaterally, no wheezing, rhonchi or rales. GI: soft, nontender, nondistended, + BS MS: no deformity or atrophy Skin: warm and dry  Neuro:  CNs II-XII intact, Strength and sensation are intact Psych: Normal affect   EKG:  EKG is ordered today.  It demonstrates:    Sinus brady, HR 44, normal axis, no change from prior tracing.    Recent Labs: No results found for requested labs within last 365 days.    Lipid Panel No results found for: CHOL, TRIG, HDL, CHOLHDL, VLDL, LDLCALC, LDLDIRECT    ASSESSMENT AND PLAN:  1.  Paroxysmal Atrial Flutter:  Maintaining NSR.  She is asymptomatic  with her bradycardia.  She is tolerating Coumadin. Continue current Rx.  2.  Hypertension: BP is better controlled.  She is now limiting her NaCl.  Continue current Rx.    Current medicines are reviewed at length with the patient today.  The patient does not have concerns regarding medicines.  The following changes have been made:  No change.   Labs/ tests ordered today include:   Orders Placed This Encounter  Procedures  . EKG 12-Lead    Disposition:   FU with Dr. Daneen Schick in 6 mos.   Signed, Versie Starks, MHS 04/04/2014 12:46 PM    [facility] [place], [place], [place]  [address] Phone: [phone]; Fax: [phone]

## 2014-04-12 DIAGNOSIS — I10 Essential (primary) hypertension: Secondary | ICD-10-CM | POA: Diagnosis not present

## 2014-04-12 DIAGNOSIS — Z23 Encounter for immunization: Secondary | ICD-10-CM | POA: Diagnosis not present

## 2014-04-12 DIAGNOSIS — E785 Hyperlipidemia, unspecified: Secondary | ICD-10-CM | POA: Diagnosis not present

## 2014-04-12 DIAGNOSIS — Z0000 Encounter for general adult medical examination without abnormal findings: Secondary | ICD-10-CM | POA: Diagnosis not present

## 2014-04-12 DIAGNOSIS — G479 Sleep disorder, unspecified: Secondary | ICD-10-CM | POA: Diagnosis not present

## 2014-04-12 DIAGNOSIS — M810 Age-related osteoporosis without current pathological fracture: Secondary | ICD-10-CM | POA: Diagnosis not present

## 2014-04-12 DIAGNOSIS — Z79899 Other long term (current) drug therapy: Secondary | ICD-10-CM | POA: Diagnosis not present

## 2014-04-18 ENCOUNTER — Ambulatory Visit (INDEPENDENT_AMBULATORY_CARE_PROVIDER_SITE_OTHER): Payer: Medicare Other | Admitting: *Deleted

## 2014-04-18 DIAGNOSIS — I4891 Unspecified atrial fibrillation: Secondary | ICD-10-CM

## 2014-04-18 LAB — POCT INR: INR: 3.1

## 2014-05-02 ENCOUNTER — Ambulatory Visit (INDEPENDENT_AMBULATORY_CARE_PROVIDER_SITE_OTHER): Payer: Medicare Other | Admitting: *Deleted

## 2014-05-02 DIAGNOSIS — I4891 Unspecified atrial fibrillation: Secondary | ICD-10-CM

## 2014-05-02 LAB — POCT INR: INR: 2.1

## 2014-05-13 DIAGNOSIS — M810 Age-related osteoporosis without current pathological fracture: Secondary | ICD-10-CM | POA: Diagnosis not present

## 2014-05-23 ENCOUNTER — Telehealth: Payer: Self-pay | Admitting: *Deleted

## 2014-05-23 ENCOUNTER — Ambulatory Visit (INDEPENDENT_AMBULATORY_CARE_PROVIDER_SITE_OTHER): Payer: Medicare Other | Admitting: *Deleted

## 2014-05-23 DIAGNOSIS — I4891 Unspecified atrial fibrillation: Secondary | ICD-10-CM

## 2014-05-23 LAB — POCT INR: INR: 2

## 2014-05-23 NOTE — Telephone Encounter (Signed)
Pt called to give dose strength of Fosamax

## 2014-06-05 ENCOUNTER — Other Ambulatory Visit: Payer: Self-pay | Admitting: Interventional Cardiology

## 2014-06-20 ENCOUNTER — Ambulatory Visit (INDEPENDENT_AMBULATORY_CARE_PROVIDER_SITE_OTHER): Payer: Medicare Other

## 2014-06-20 DIAGNOSIS — I4891 Unspecified atrial fibrillation: Secondary | ICD-10-CM

## 2014-06-20 LAB — POCT INR: INR: 2.5

## 2014-07-08 DIAGNOSIS — H35371 Puckering of macula, right eye: Secondary | ICD-10-CM | POA: Diagnosis not present

## 2014-07-08 DIAGNOSIS — H40053 Ocular hypertension, bilateral: Secondary | ICD-10-CM | POA: Diagnosis not present

## 2014-07-08 DIAGNOSIS — Z961 Presence of intraocular lens: Secondary | ICD-10-CM | POA: Diagnosis not present

## 2014-08-01 ENCOUNTER — Ambulatory Visit (INDEPENDENT_AMBULATORY_CARE_PROVIDER_SITE_OTHER): Payer: Medicare Other | Admitting: *Deleted

## 2014-08-01 DIAGNOSIS — I4891 Unspecified atrial fibrillation: Secondary | ICD-10-CM | POA: Diagnosis not present

## 2014-08-01 LAB — POCT INR: INR: 2.4

## 2014-09-12 ENCOUNTER — Ambulatory Visit (INDEPENDENT_AMBULATORY_CARE_PROVIDER_SITE_OTHER): Payer: Medicare Other | Admitting: *Deleted

## 2014-09-12 DIAGNOSIS — I4891 Unspecified atrial fibrillation: Secondary | ICD-10-CM

## 2014-09-12 LAB — POCT INR: INR: 2.4

## 2014-09-30 ENCOUNTER — Other Ambulatory Visit: Payer: Self-pay | Admitting: Interventional Cardiology

## 2014-10-09 DIAGNOSIS — B370 Candidal stomatitis: Secondary | ICD-10-CM | POA: Diagnosis not present

## 2014-10-17 ENCOUNTER — Ambulatory Visit (INDEPENDENT_AMBULATORY_CARE_PROVIDER_SITE_OTHER): Payer: Medicare Other | Admitting: Interventional Cardiology

## 2014-10-17 ENCOUNTER — Encounter: Payer: Self-pay | Admitting: Interventional Cardiology

## 2014-10-17 ENCOUNTER — Ambulatory Visit (INDEPENDENT_AMBULATORY_CARE_PROVIDER_SITE_OTHER): Payer: Medicare Other | Admitting: *Deleted

## 2014-10-17 VITALS — BP 136/68 | HR 57 | Ht 67.5 in | Wt 140.8 lb

## 2014-10-17 DIAGNOSIS — I495 Sick sinus syndrome: Secondary | ICD-10-CM | POA: Insufficient documentation

## 2014-10-17 DIAGNOSIS — I10 Essential (primary) hypertension: Secondary | ICD-10-CM | POA: Diagnosis not present

## 2014-10-17 DIAGNOSIS — Z7901 Long term (current) use of anticoagulants: Secondary | ICD-10-CM | POA: Diagnosis not present

## 2014-10-17 DIAGNOSIS — I4891 Unspecified atrial fibrillation: Secondary | ICD-10-CM

## 2014-10-17 DIAGNOSIS — I4892 Unspecified atrial flutter: Secondary | ICD-10-CM

## 2014-10-17 LAB — POCT INR: INR: 2.8

## 2014-10-17 NOTE — Progress Notes (Signed)
Cardiology Office Note   Date:  10/17/2014   ID:  Martha Edwards, DOB 24-Apr-1932, MRN [id number]  PCP:  Lilian Coma, MD  Cardiologist:  Sinclair Grooms, MD   Chief Complaint  Patient presents with  . Atrial Fibrillation      History of Present Illness: Martha Edwards is a 81 y.o. female who presents for asymptomatic tachycardia-bradycardia syndrome, history of atrial flutter, essential hypertension, chronic anticoagulation therapy.  She is exercising by walking at the [facility] several times per week. No change in his assertion of tolerance. She denies chest discomfort. There is no orthopnea, PND, dizziness, fainting, or edema. Overall she feels well. Appetite is been stable. She is resting well.  No transient neurological complaints. She denies claudication.    Past Medical History  Diagnosis Date  . Anxiety   . Hypercholesterolemia   . Diverticula, colon   . Overactive bladder   . Osteopenia     > osteoporosis  . Hypertension     with LVH on echo 06/03/11  . Carotid artery plaque     mild bilateral carotid plaque without significant stenosis (<20% in left ICA), 04/2010  . Atrial flutter     with LVEF 60%, LVH, LAE and holter with rate control and Ave HR 60 bpm   . Hypertonicity of bladder   . Insomnia   . Anticoagulated   . Mitral regurgitation   . Arthritis of hand   . CKD (chronic kidney disease) stage 2, GFR 60-89 ml/min     Past Surgical History  Procedure Laterality Date  . Colonoscopy  2001    2007 sigmoidoscopy      Current Outpatient Prescriptions  Medication Sig Dispense Refill  . acetaminophen (TYLENOL) 325 MG tablet Take 650 mg by mouth every 6 (six) hours as needed for mild pain.    Marland Kitchen alendronate (FOSAMAX) 70 MG tablet Take 70 mg by mouth once a week. Take with a full glass of water on an empty stomach.    Marland Kitchen amLODipine (NORVASC) 2.5 MG tablet Take 1 tablet (2.5 mg total) by mouth daily. 30 tablet 11  . atorvastatin (LIPITOR) 40 MG  tablet Take 40 mg by mouth daily.    . Cholecalciferol (VITAMIN D3 PO) Take 1 tablet by mouth daily.    . metoprolol succinate (TOPROL-XL) 25 MG 24 hr tablet Take 12.5 mg by mouth daily.    Marland Kitchen nystatin (MYCOSTATIN) 100000 UNIT/ML suspension SWISH AND SWALLOW 1 TO 2 TEASPOONFULS TWICE A DAY FOR 14 DAYS  0  . warfarin (COUMADIN) 4 MG tablet TAKE 1 AND 1/2 TABLETS BY MOUTH ALL DAYS OF THE WEEK OR AS DIRECTED. 45 tablet 3  . zolpidem (AMBIEN) 10 MG tablet Take 5 mg by mouth at bedtime as needed for sleep.     No current facility-administered medications for this visit.    Allergies:   Review of patient's allergies indicates no known allergies.    Social History:  The patient  reports that she has quit smoking. She has never used smokeless tobacco.   Family History:  The patient's family history includes Breast cancer in her sister; Diabetes in her mother; Liver cancer in her mother; Prostate cancer in her brother. There is no history of Heart attack or Stroke.    ROS:  Please see the history of present illness.   Otherwise, review of systems are positive for none.   All other systems are reviewed and negative.    PHYSICAL EXAM: VS:  BP 136/68 mmHg  Pulse 57  Ht 5' 7.5" (1.715 m)  Wt 63.866 kg (140 lb 12.8 oz)  BMI 21.71 kg/m2  SpO2 96% , BMI Body mass index is 21.71 kg/(m^2). GEN: Well nourished, well developed, in no acute distress HEENT: normal Neck: no JVD, carotid bruits, or masses Cardiac: RRR.  There is no murmur, rub, or gallop. There is no edema. Respiratory:  clear to auscultation bilaterally, normal work of breathing. GI: soft, nontender, nondistended, + BS MS: no deformity or atrophy Skin: warm and dry, no rash Neuro:  Strength and sensation are intact Psych: euthymic mood, full affect   EKG:  EKG is not ordered today.    Recent Labs: No results found for requested labs within last 365 days.    Lipid Panel No results found for: CHOL, TRIG, HDL, CHOLHDL, VLDL,  LDLCALC, LDLDIRECT    Wt Readings from Last 3 Encounters:  10/17/14 63.866 kg (140 lb 12.8 oz)  04/04/14 67.586 kg (149 lb)  03/06/14 68.947 kg (152 lb)      Other studies Reviewed: Additional studies/ records that were reviewed today include: None. .    ASSESSMENT AND PLAN:  1. Paroxysmal atrial flutter Asymptomatic  2. Essential hypertension Controlled  3. Chronic anticoagulation No bleeding complications  4. Asymptomatic tachybradycardia syndrome Consider 24-hour Holter on the next visit  Current medicines are reviewed at length with the patient today.  The patient has the following concerns regarding medicines: None.  The following changes/actions have been instituted:    Continue aerobic activity  Call if near syncope, chest discomfort, dyspnea, or chest pain  Check stools and urine for any evidence of bleeding  Labs/ tests ordered today include:  No orders of the defined types were placed in this encounter.     Disposition:   FU with HS in 1 year  Signed, Sinclair Grooms, MD  10/17/2014 11:34 AM    [facility], [place], [place]  [address] Phone: [phone]; Fax: [phone]

## 2014-10-17 NOTE — Patient Instructions (Signed)
Medication Instructions:  Your physician recommends that you continue on your current medications as directed. Please refer to the Current Medication list given to you today.   Labwork: None ordered  Testing/Procedures: None orderd  Follow-Up: Your physician wants you to follow-up in: 1 year with Dr.Smith You will receive a reminder letter in the mail two months in advance. If you don't receive a letter, please call our office to schedule the follow-up appointment.   Any Other Special Instructions Will Be Listed Below (If Applicable).

## 2014-11-21 DIAGNOSIS — R432 Parageusia: Secondary | ICD-10-CM | POA: Diagnosis not present

## 2014-11-21 DIAGNOSIS — K140 Glossitis: Secondary | ICD-10-CM | POA: Diagnosis not present

## 2014-11-21 DIAGNOSIS — J3489 Other specified disorders of nose and nasal sinuses: Secondary | ICD-10-CM | POA: Diagnosis not present

## 2014-11-28 ENCOUNTER — Ambulatory Visit (INDEPENDENT_AMBULATORY_CARE_PROVIDER_SITE_OTHER): Payer: Medicare Other | Admitting: *Deleted

## 2014-11-28 DIAGNOSIS — I4891 Unspecified atrial fibrillation: Secondary | ICD-10-CM | POA: Diagnosis not present

## 2014-11-28 LAB — POCT INR: INR: 2.7

## 2014-12-11 ENCOUNTER — Other Ambulatory Visit: Payer: Self-pay

## 2014-12-11 DIAGNOSIS — Z1231 Encounter for screening mammogram for malignant neoplasm of breast: Secondary | ICD-10-CM

## 2015-01-09 ENCOUNTER — Ambulatory Visit (INDEPENDENT_AMBULATORY_CARE_PROVIDER_SITE_OTHER): Payer: Medicare Other

## 2015-01-09 DIAGNOSIS — I4891 Unspecified atrial fibrillation: Secondary | ICD-10-CM | POA: Diagnosis not present

## 2015-01-09 LAB — POCT INR: INR: 2.6

## 2015-01-20 ENCOUNTER — Ambulatory Visit
Admission: RE | Admit: 2015-01-20 | Discharge: 2015-01-20 | Disposition: A | Discharge status: Home / Self Care | Payer: Medicare Other | Source: Ambulatory Visit

## 2015-01-20 DIAGNOSIS — Z1231 Encounter for screening mammogram for malignant neoplasm of breast: Secondary | ICD-10-CM | POA: Diagnosis not present

## 2015-01-21 ENCOUNTER — Other Ambulatory Visit: Payer: Self-pay | Admitting: Family Medicine

## 2015-01-21 DIAGNOSIS — R928 Other abnormal and inconclusive findings on diagnostic imaging of breast: Secondary | ICD-10-CM

## 2015-02-02 DIAGNOSIS — C50919 Malignant neoplasm of unspecified site of unspecified female breast: Secondary | ICD-10-CM

## 2015-02-02 HISTORY — DX: Malignant neoplasm of unspecified site of unspecified female breast: C50.919

## 2015-02-09 ENCOUNTER — Other Ambulatory Visit: Payer: Self-pay | Admitting: Interventional Cardiology

## 2015-02-10 ENCOUNTER — Other Ambulatory Visit: Payer: Medicare Other

## 2015-02-19 ENCOUNTER — Other Ambulatory Visit: Payer: Self-pay | Admitting: Family Medicine

## 2015-02-19 ENCOUNTER — Ambulatory Visit
Admission: RE | Admit: 2015-02-19 | Discharge: 2015-02-19 | Disposition: A | Discharge status: Home / Self Care | Payer: Medicare Other | Source: Ambulatory Visit | Attending: Family Medicine | Admitting: Family Medicine

## 2015-02-19 ENCOUNTER — Other Ambulatory Visit: Payer: Medicare Other

## 2015-02-19 DIAGNOSIS — R928 Other abnormal and inconclusive findings on diagnostic imaging of breast: Secondary | ICD-10-CM

## 2015-02-19 DIAGNOSIS — N63 Unspecified lump in breast: Secondary | ICD-10-CM | POA: Diagnosis not present

## 2015-02-19 DIAGNOSIS — N6310 Unspecified lump in the right breast, unspecified quadrant: Secondary | ICD-10-CM

## 2015-02-20 ENCOUNTER — Ambulatory Visit (INDEPENDENT_AMBULATORY_CARE_PROVIDER_SITE_OTHER): Payer: Medicare Other | Admitting: *Deleted

## 2015-02-20 DIAGNOSIS — I4891 Unspecified atrial fibrillation: Secondary | ICD-10-CM

## 2015-02-20 LAB — POCT INR: INR: 2.7

## 2015-02-21 ENCOUNTER — Other Ambulatory Visit: Payer: Self-pay | Admitting: Family Medicine

## 2015-02-21 DIAGNOSIS — N6310 Unspecified lump in the right breast, unspecified quadrant: Secondary | ICD-10-CM

## 2015-02-22 ENCOUNTER — Other Ambulatory Visit: Payer: Self-pay | Admitting: Physician Assistant

## 2015-02-27 ENCOUNTER — Other Ambulatory Visit: Payer: Self-pay | Admitting: Family Medicine

## 2015-02-27 ENCOUNTER — Ambulatory Visit
Admission: RE | Admit: 2015-02-27 | Discharge: 2015-02-27 | Disposition: A | Discharge status: Home / Self Care | Payer: Medicare Other | Source: Ambulatory Visit | Attending: Family Medicine | Admitting: Family Medicine

## 2015-02-27 DIAGNOSIS — N63 Unspecified lump in breast: Secondary | ICD-10-CM | POA: Diagnosis not present

## 2015-02-27 DIAGNOSIS — D0511 Intraductal carcinoma in situ of right breast: Secondary | ICD-10-CM | POA: Diagnosis not present

## 2015-02-27 DIAGNOSIS — N6310 Unspecified lump in the right breast, unspecified quadrant: Secondary | ICD-10-CM

## 2015-02-27 DIAGNOSIS — C50211 Malignant neoplasm of upper-inner quadrant of right female breast: Secondary | ICD-10-CM | POA: Diagnosis not present

## 2015-03-06 ENCOUNTER — Other Ambulatory Visit: Payer: Self-pay | Admitting: General Surgery

## 2015-03-06 DIAGNOSIS — C50911 Malignant neoplasm of unspecified site of right female breast: Secondary | ICD-10-CM

## 2015-03-18 ENCOUNTER — Telehealth: Payer: Self-pay | Admitting: Interventional Cardiology

## 2015-03-18 NOTE — Telephone Encounter (Signed)
New message  Request for surgical clearance:  1. What type of surgery is being performed? Right Breast lumpectomy   2. When is this surgery scheduled? Not scheduled yet. Waiting to hear from cardiologist   3. Are there any medications that need to be held prior to surgery and how long? Hold the coumadin   4. Name of physician performing surgery? Dr Excell Seltzer with [facility]  5. What is your office phone and fax number?  Phone [phone] fax# [phone]  Comments: their office states that they faxed the clearance on the 2nd of February. Please call back to discuss.

## 2015-03-18 NOTE — Telephone Encounter (Signed)
Cleared for the upcoming surgical procedure. Okay to hold coumadin 5 days prior to the procedure.

## 2015-03-18 NOTE — Telephone Encounter (Signed)
Telephoned pt and made her aware to call us when her lumpectomy is scheduled so we can give instructions on holding her Coumadin for 5 days per Dr Tamala Julian. She verbalized understanding to call us with date.

## 2015-03-18 NOTE — Telephone Encounter (Signed)
Clearance placed in MR nurse fax box to be faxed to [facility] attn: Dr.Hoxworth fax # [phone].  Update fwd to [facility]

## 2015-03-18 NOTE — Telephone Encounter (Signed)
Fwd to Dr.Smith to advise 

## 2015-03-21 ENCOUNTER — Other Ambulatory Visit: Payer: Self-pay | Admitting: General Surgery

## 2015-03-21 DIAGNOSIS — C50911 Malignant neoplasm of unspecified site of right female breast: Secondary | ICD-10-CM

## 2015-03-24 ENCOUNTER — Telehealth: Payer: Self-pay

## 2015-03-24 NOTE — Telephone Encounter (Signed)
Error

## 2015-03-27 ENCOUNTER — Telehealth: Payer: Self-pay | Admitting: *Deleted

## 2015-03-27 NOTE — Telephone Encounter (Signed)
Patient called and states her lumpectomy is on 04/07/2015 but she is having a "seed" implanted in that breast on 04/02/2015, should she hold her coumadin for that procedure. Spoke with Gay Filler the pharmacist and was instructed to have patient call Dr Sharee Pimple office to see if she should hold her coumadin for that procedure. I have called and informed patient of this. We do have clearance from Dr Tamala Julian to hold coumadin 5 days prior to lumpectomy, see previous note.

## 2015-03-27 NOTE — Telephone Encounter (Signed)
Per Dr Excell Seltzer patient does not need to hold coumadin for the seed implant in breast on 04/02/2015 (this is prior to lumpectomy 04/07/2015). Instructed patient to take last dose of coumadin on 04/01/2015 (note to hold coumadin 5 days prior to surgery), instructed her that her surgeon will instruct her when to start her coumadin back but its usually day of surgery unless there is some bleeding. She states she understands instructions.

## 2015-04-01 ENCOUNTER — Encounter (HOSPITAL_BASED_OUTPATIENT_CLINIC_OR_DEPARTMENT_OTHER): Payer: Self-pay | Admitting: *Deleted

## 2015-04-01 ENCOUNTER — Encounter (HOSPITAL_BASED_OUTPATIENT_CLINIC_OR_DEPARTMENT_OTHER)
Admission: RE | Admit: 2015-04-01 | Discharge: 2015-04-01 | Disposition: A | Discharge status: Home / Self Care | Payer: Medicare Other | Source: Ambulatory Visit | Attending: General Surgery | Admitting: General Surgery

## 2015-04-01 DIAGNOSIS — I4891 Unspecified atrial fibrillation: Secondary | ICD-10-CM | POA: Insufficient documentation

## 2015-04-01 DIAGNOSIS — Z01810 Encounter for preprocedural cardiovascular examination: Secondary | ICD-10-CM | POA: Insufficient documentation

## 2015-04-01 DIAGNOSIS — I10 Essential (primary) hypertension: Secondary | ICD-10-CM | POA: Diagnosis not present

## 2015-04-01 NOTE — Progress Notes (Signed)
Patient's last dose of Coumadin 04-01-15. No PT/INR ordered per Dr Excell Seltzer. Asked Dr Al Corpus if anesthesia needed coags, stated no not if patient off x 5days prior to surgery.

## 2015-04-02 ENCOUNTER — Ambulatory Visit
Admission: RE | Admit: 2015-04-02 | Discharge: 2015-04-02 | Disposition: A | Discharge status: Home / Self Care | Payer: Medicare Other | Source: Ambulatory Visit | Attending: General Surgery | Admitting: General Surgery

## 2015-04-02 DIAGNOSIS — C50911 Malignant neoplasm of unspecified site of right female breast: Secondary | ICD-10-CM | POA: Diagnosis not present

## 2015-04-07 ENCOUNTER — Encounter (HOSPITAL_BASED_OUTPATIENT_CLINIC_OR_DEPARTMENT_OTHER): Payer: Self-pay | Admitting: Anesthesiology

## 2015-04-07 ENCOUNTER — Ambulatory Visit (HOSPITAL_BASED_OUTPATIENT_CLINIC_OR_DEPARTMENT_OTHER): Payer: Medicare Other | Admitting: Anesthesiology

## 2015-04-07 ENCOUNTER — Ambulatory Visit (HOSPITAL_BASED_OUTPATIENT_CLINIC_OR_DEPARTMENT_OTHER)
Admission: RE | Admit: 2015-04-07 | Discharge: 2015-04-07 | Disposition: A | Discharge status: Home / Self Care | Payer: Medicare Other | Source: Ambulatory Visit | Attending: General Surgery | Admitting: General Surgery

## 2015-04-07 ENCOUNTER — Ambulatory Visit
Admission: RE | Admit: 2015-04-07 | Discharge: 2015-04-07 | Disposition: A | Discharge status: Home / Self Care | Payer: Medicare Other | Source: Ambulatory Visit | Attending: General Surgery | Admitting: General Surgery

## 2015-04-07 ENCOUNTER — Encounter (HOSPITAL_BASED_OUTPATIENT_CLINIC_OR_DEPARTMENT_OTHER)
Admission: RE | Disposition: A | Discharge status: Home / Self Care | Payer: Self-pay | Source: Ambulatory Visit | Attending: General Surgery

## 2015-04-07 DIAGNOSIS — Z79899 Other long term (current) drug therapy: Secondary | ICD-10-CM | POA: Diagnosis not present

## 2015-04-07 DIAGNOSIS — I10 Essential (primary) hypertension: Secondary | ICD-10-CM | POA: Insufficient documentation

## 2015-04-07 DIAGNOSIS — E7800 Pure hypercholesterolemia, unspecified: Secondary | ICD-10-CM | POA: Diagnosis not present

## 2015-04-07 DIAGNOSIS — Z7901 Long term (current) use of anticoagulants: Secondary | ICD-10-CM | POA: Diagnosis not present

## 2015-04-07 DIAGNOSIS — I129 Hypertensive chronic kidney disease with stage 1 through stage 4 chronic kidney disease, or unspecified chronic kidney disease: Secondary | ICD-10-CM | POA: Diagnosis not present

## 2015-04-07 DIAGNOSIS — C50411 Malignant neoplasm of upper-outer quadrant of right female breast: Secondary | ICD-10-CM | POA: Diagnosis not present

## 2015-04-07 DIAGNOSIS — I739 Peripheral vascular disease, unspecified: Secondary | ICD-10-CM | POA: Insufficient documentation

## 2015-04-07 DIAGNOSIS — C50211 Malignant neoplasm of upper-inner quadrant of right female breast: Secondary | ICD-10-CM | POA: Diagnosis not present

## 2015-04-07 DIAGNOSIS — Z170 Estrogen receptor positive status [ER+]: Secondary | ICD-10-CM | POA: Insufficient documentation

## 2015-04-07 DIAGNOSIS — C50911 Malignant neoplasm of unspecified site of right female breast: Secondary | ICD-10-CM

## 2015-04-07 DIAGNOSIS — Z87891 Personal history of nicotine dependence: Secondary | ICD-10-CM | POA: Diagnosis not present

## 2015-04-07 DIAGNOSIS — I4891 Unspecified atrial fibrillation: Secondary | ICD-10-CM | POA: Diagnosis not present

## 2015-04-07 DIAGNOSIS — R928 Other abnormal and inconclusive findings on diagnostic imaging of breast: Secondary | ICD-10-CM | POA: Diagnosis not present

## 2015-04-07 DIAGNOSIS — N189 Chronic kidney disease, unspecified: Secondary | ICD-10-CM | POA: Diagnosis not present

## 2015-04-07 HISTORY — PX: BREAST LUMPECTOMY WITH RADIOACTIVE SEED LOCALIZATION: SHX6424

## 2015-04-07 SURGERY — BREAST LUMPECTOMY WITH RADIOACTIVE SEED LOCALIZATION
Anesthesia: General | Site: Breast | Laterality: Right

## 2015-04-07 MED ORDER — EPHEDRINE SULFATE 50 MG/ML IJ SOLN
INTRAMUSCULAR | Status: AC
  Filled 2015-04-07: qty 1

## 2015-04-07 MED ORDER — BUPIVACAINE-EPINEPHRINE (PF) 0.5% -1:200000 IJ SOLN
INTRAMUSCULAR | Status: AC
  Filled 2015-04-07: qty 30

## 2015-04-07 MED ORDER — GLYCOPYRROLATE 0.2 MG/ML IJ SOLN
INTRAMUSCULAR | Status: AC
  Filled 2015-04-07: qty 1

## 2015-04-07 MED ORDER — FENTANYL CITRATE (PF) 100 MCG/2ML IJ SOLN: 25.0000 ug | INTRAMUSCULAR | Status: DC | PRN

## 2015-04-07 MED ORDER — BUPIVACAINE-EPINEPHRINE (PF) 0.25% -1:200000 IJ SOLN
INTRAMUSCULAR | Status: DC | PRN
  Administered 2015-04-07: 16 mL

## 2015-04-07 MED ORDER — LIDOCAINE HCL (CARDIAC) 20 MG/ML IV SOLN
INTRAVENOUS | Status: DC | PRN
  Administered 2015-04-07: 50 mg via INTRAVENOUS

## 2015-04-07 MED ORDER — SUCCINYLCHOLINE CHLORIDE 20 MG/ML IJ SOLN
INTRAMUSCULAR | Status: AC
  Filled 2015-04-07: qty 1

## 2015-04-07 MED ORDER — SCOPOLAMINE 1 MG/3DAYS TD PT72: 1.0000 | MEDICATED_PATCH | Freq: Once | TRANSDERMAL | Status: DC | PRN

## 2015-04-07 MED ORDER — ONDANSETRON HCL 4 MG/2ML IJ SOLN
INTRAMUSCULAR | Status: DC | PRN
  Administered 2015-04-07: 4 mg via INTRAVENOUS

## 2015-04-07 MED ORDER — FENTANYL CITRATE (PF) 100 MCG/2ML IJ SOLN
50.0000 ug | INTRAMUSCULAR | Status: DC | PRN
  Administered 2015-04-07: 100 ug via INTRAVENOUS

## 2015-04-07 MED ORDER — LACTATED RINGERS IV SOLN
INTRAVENOUS | Status: DC
  Administered 2015-04-07: 12:00:00 via INTRAVENOUS

## 2015-04-07 MED ORDER — CEFAZOLIN SODIUM-DEXTROSE 2-3 GM-% IV SOLR
INTRAVENOUS | Status: AC
  Filled 2015-04-07: qty 50

## 2015-04-07 MED ORDER — EPHEDRINE SULFATE 50 MG/ML IJ SOLN
INTRAMUSCULAR | Status: DC | PRN
  Administered 2015-04-07: 15 mg via INTRAVENOUS

## 2015-04-07 MED ORDER — DEXAMETHASONE SODIUM PHOSPHATE 4 MG/ML IJ SOLN
INTRAMUSCULAR | Status: DC | PRN
  Administered 2015-04-07: 8 mg via INTRAVENOUS

## 2015-04-07 MED ORDER — GLYCOPYRROLATE 0.2 MG/ML IJ SOLN
0.2000 mg | Freq: Once | INTRAMUSCULAR | Status: AC | PRN
  Administered 2015-04-07: 0.2 mg via INTRAVENOUS

## 2015-04-07 MED ORDER — BUPIVACAINE-EPINEPHRINE (PF) 0.25% -1:200000 IJ SOLN
INTRAMUSCULAR | Status: AC
  Filled 2015-04-07: qty 30

## 2015-04-07 MED ORDER — ATROPINE SULFATE 0.4 MG/ML IJ SOLN
INTRAMUSCULAR | Status: AC
  Filled 2015-04-07: qty 1

## 2015-04-07 MED ORDER — PROPOFOL 500 MG/50ML IV EMUL
INTRAVENOUS | Status: AC
  Filled 2015-04-07: qty 50

## 2015-04-07 MED ORDER — HYDROCODONE-ACETAMINOPHEN 5-325 MG PO TABS: 1.0000 | ORAL_TABLET | ORAL | Status: DC | PRN

## 2015-04-07 MED ORDER — DEXAMETHASONE SODIUM PHOSPHATE 10 MG/ML IJ SOLN
INTRAMUSCULAR | Status: AC
  Filled 2015-04-07: qty 1

## 2015-04-07 MED ORDER — FENTANYL CITRATE (PF) 100 MCG/2ML IJ SOLN
INTRAMUSCULAR | Status: AC
  Filled 2015-04-07: qty 2

## 2015-04-07 MED ORDER — PHENYLEPHRINE 40 MCG/ML (10ML) SYRINGE FOR IV PUSH (FOR BLOOD PRESSURE SUPPORT)
PREFILLED_SYRINGE | INTRAVENOUS | Status: AC
  Filled 2015-04-07: qty 10

## 2015-04-07 MED ORDER — CEFAZOLIN SODIUM-DEXTROSE 2-3 GM-% IV SOLR
2.0000 g | INTRAVENOUS | Status: AC
  Administered 2015-04-07: 2 g via INTRAVENOUS

## 2015-04-07 MED ORDER — ONDANSETRON HCL 4 MG/2ML IJ SOLN
INTRAMUSCULAR | Status: AC
  Filled 2015-04-07: qty 2

## 2015-04-07 MED ORDER — MIDAZOLAM HCL 2 MG/2ML IJ SOLN: 1.0000 mg | INTRAMUSCULAR | Status: DC | PRN

## 2015-04-07 MED ORDER — LIDOCAINE HCL (CARDIAC) 20 MG/ML IV SOLN
INTRAVENOUS | Status: AC
  Filled 2015-04-07: qty 5

## 2015-04-07 SURGICAL SUPPLY — 49 items
APPLIER CLIP 9.375 MED OPEN (MISCELLANEOUS)
BINDER BREAST LRG (GAUZE/BANDAGES/DRESSINGS) IMPLANT
BINDER BREAST MEDIUM (GAUZE/BANDAGES/DRESSINGS) ×3 IMPLANT
BINDER BREAST XLRG (GAUZE/BANDAGES/DRESSINGS) IMPLANT
BINDER BREAST XXLRG (GAUZE/BANDAGES/DRESSINGS) IMPLANT
BLADE SURG 15 STRL LF DISP TIS (BLADE) ×1 IMPLANT
BLADE SURG 15 STRL SS (BLADE) ×2
CANISTER SUC SOCK COL 7IN (MISCELLANEOUS) IMPLANT
CANISTER SUCT 1200ML W/VALVE (MISCELLANEOUS) IMPLANT
CHLORAPREP W/TINT 26ML (MISCELLANEOUS) ×3 IMPLANT
CLIP APPLIE 9.375 MED OPEN (MISCELLANEOUS) IMPLANT
CLIP TI WIDE RED SMALL 6 (CLIP) IMPLANT
COVER BACK TABLE 60X90IN (DRAPES) ×3 IMPLANT
COVER MAYO STAND STRL (DRAPES) ×3 IMPLANT
COVER PROBE W GEL 5X96 (DRAPES) ×3 IMPLANT
DECANTER SPIKE VIAL GLASS SM (MISCELLANEOUS) IMPLANT
DEVICE DUBIN W/COMP PLATE 8390 (MISCELLANEOUS) ×3 IMPLANT
DRAPE LAPAROSCOPIC ABDOMINAL (DRAPES) ×3 IMPLANT
DRAPE UTILITY XL STRL (DRAPES) ×3 IMPLANT
ELECT COATED BLADE 2.86 ST (ELECTRODE) ×3 IMPLANT
ELECT REM PT RETURN 9FT ADLT (ELECTROSURGICAL) ×3
ELECTRODE REM PT RTRN 9FT ADLT (ELECTROSURGICAL) ×1 IMPLANT
GLOVE BIOGEL PI IND STRL 7.0 (GLOVE) ×2 IMPLANT
GLOVE BIOGEL PI IND STRL 8 (GLOVE) ×1 IMPLANT
GLOVE BIOGEL PI INDICATOR 7.0 (GLOVE) ×4
GLOVE BIOGEL PI INDICATOR 8 (GLOVE) ×2
GLOVE ECLIPSE 6.5 STRL STRAW (GLOVE) ×3 IMPLANT
GLOVE ECLIPSE 7.5 STRL STRAW (GLOVE) ×3 IMPLANT
GOWN STRL REUS W/ TWL LRG LVL3 (GOWN DISPOSABLE) ×1 IMPLANT
GOWN STRL REUS W/ TWL XL LVL3 (GOWN DISPOSABLE) ×1 IMPLANT
GOWN STRL REUS W/TWL LRG LVL3 (GOWN DISPOSABLE) ×2
GOWN STRL REUS W/TWL XL LVL3 (GOWN DISPOSABLE) ×2
ILLUMINATOR WAVEGUIDE N/F (MISCELLANEOUS) IMPLANT
KIT MARKER MARGIN INK (KITS) ×3 IMPLANT
LIQUID BAND (GAUZE/BANDAGES/DRESSINGS) ×3 IMPLANT
NEEDLE HYPO 25X1 1.5 SAFETY (NEEDLE) ×3 IMPLANT
NS IRRIG 1000ML POUR BTL (IV SOLUTION) IMPLANT
PACK BASIN DAY SURGERY FS (CUSTOM PROCEDURE TRAY) ×3 IMPLANT
PENCIL BUTTON HOLSTER BLD 10FT (ELECTRODE) ×3 IMPLANT
SLEEVE SCD COMPRESS KNEE MED (MISCELLANEOUS) ×3 IMPLANT
SPONGE LAP 4X18 X RAY DECT (DISPOSABLE) ×3 IMPLANT
SUT MON AB 5-0 PS2 18 (SUTURE) ×3 IMPLANT
SUT VICRYL 3-0 CR8 SH (SUTURE) ×3 IMPLANT
SYR CONTROL 10ML LL (SYRINGE) ×3 IMPLANT
TOWEL OR 17X24 6PK STRL BLUE (TOWEL DISPOSABLE) ×3 IMPLANT
TOWEL OR NON WOVEN STRL DISP B (DISPOSABLE) ×3 IMPLANT
TUBE CONNECTING 20'X1/4 (TUBING)
TUBE CONNECTING 20X1/4 (TUBING) IMPLANT
YANKAUER SUCT BULB TIP NO VENT (SUCTIONS) IMPLANT

## 2015-04-07 NOTE — Anesthesia Procedure Notes (Signed)
Procedure Name: LMA Insertion Date/Time: 04/07/2015 12:46 PM Performed by: Melynda Ripple D Pre-anesthesia Checklist: Patient identified, Emergency Drugs available, Suction available and Patient being monitored Patient Re-evaluated:Patient Re-evaluated prior to inductionOxygen Delivery Method: Circle System Utilized Preoxygenation: Pre-oxygenation with 100% oxygen Intubation Type: IV induction Ventilation: Mask ventilation without difficulty LMA: LMA inserted LMA Size: 4.0 Number of attempts: 1 Airway Equipment and Method: Bite block Placement Confirmation: positive ETCO2 Tube secured with: Tape Dental Injury: Teeth and Oropharynx as per pre-operative assessment

## 2015-04-07 NOTE — Discharge Instructions (Signed)
° °Post Anesthesia Home Care Instructions ° °Activity: °Get plenty of rest for the remainder of the day. A responsible adult should stay with you for 24 hours following the procedure.  °For the next 24 hours, DO NOT: °-Drive a car °-Operate machinery °-Drink alcoholic beverages °-Take any medication unless instructed by your physician °-Make any legal decisions or sign important papers. ° °Meals: °Start with liquid foods such as gelatin or soup. Progress to regular foods as tolerated. Avoid greasy, spicy, heavy foods. If nausea and/or vomiting occur, drink only clear liquids until the nausea and/or vomiting subsides. Call your physician if vomiting continues. ° °Special Instructions/Symptoms: °Your throat may feel dry or sore from the anesthesia or the breathing tube placed in your throat during surgery. If this causes discomfort, gargle with warm salt water. The discomfort should disappear within 24 hours. ° °If you had a scopolamine patch placed behind your ear for the management of post- operative nausea and/or vomiting: ° °1. The medication in the patch is effective for 72 hours, after which it should be removed.  Wrap patch in a tissue and discard in the trash. Wash hands thoroughly with soap and water. °2. You may remove the patch earlier than 72 hours if you experience unpleasant side effects which may include dry mouth, dizziness or visual disturbances. °3. Avoid touching the patch. Wash your hands with soap and water after contact with the patch. °  ° ° °[facility] °Office Phone Number [phone] ° °BREAST BIOPSY/ PARTIAL MASTECTOMY: POST OP INSTRUCTIONS ° °Always review your discharge instruction sheet given to you by the facility where your surgery was performed. ° °IF YOU HAVE DISABILITY OR FAMILY LEAVE FORMS, YOU MUST BRING THEM TO THE OFFICE FOR PROCESSING.  DO NOT GIVE THEM TO YOUR DOCTOR. ° °1. A prescription for pain medication may be given to you upon discharge.  Take your pain  medication as prescribed, if needed.  If narcotic pain medicine is not needed, then you may take acetaminophen (Tylenol) or ibuprofen (Advil) as needed. °2. Take your usually prescribed medications unless otherwise directed °3. If you need a refill on your pain medication, please contact your pharmacy.  They will contact our office to request authorization.  Prescriptions will not be filled after 5pm or on week-ends. °4. You should eat very light the first 24 hours after surgery, such as soup, crackers, pudding, etc.  Resume your normal diet the day after surgery. °5. Most patients will experience some swelling and bruising in the breast.  Ice packs and a good support bra will help.  Swelling and bruising can take several days to resolve.  °6. It is common to experience some constipation if taking pain medication after surgery.  Increasing fluid intake and taking a stool softener will usually help or prevent this problem from occurring.  A mild laxative (Milk of Magnesia or Miralax) should be taken according to package directions if there are no bowel movements after 48 hours. °7. Unless discharge instructions indicate otherwise, you may remove your bandages 24-48 hours after surgery, and you may shower at that time.  You may have steri-strips (small skin tapes) in place directly over the incision.  These strips should be left on the skin for 7-10 days.  If your surgeon used skin glue on the incision, you may shower in 24 hours.  The glue will flake off over the next 2-3 weeks.  Any sutures or staples will be removed at the office during your follow-up visit. °8.   ACTIVITIES:  You may resume regular daily activities (gradually increasing) beginning the next day.  Wearing a good support bra or sports bra minimizes pain and swelling.  You may have sexual intercourse when it is comfortable. °a. You may drive when you no longer are taking prescription pain medication, you can comfortably wear a seatbelt, and you can  safely maneuver your car and apply brakes. °b. RETURN TO WORK:  ______________________________________________________________________________________ °9. You should see your doctor in the office for a follow-up appointment approximately two weeks after your surgery.  Your doctor’s nurse will typically make your follow-up appointment when she calls you with your pathology report.  Expect your pathology report 2-3 business days after your surgery.  You may call to check if you do not hear from us after three days. °10. OTHER INSTRUCTIONS: _______________________________________________________________________________________________ _____________________________________________________________________________________________________________________________________ °_____________________________________________________________________________________________________________________________________ °_____________________________________________________________________________________________________________________________________ ° °WHEN TO CALL YOUR DOCTOR: °1. Fever over 101.0 °2. Nausea and/or vomiting. °3. Extreme swelling or bruising. °4. Continued bleeding from incision. °5. Increased pain, redness, or drainage from the incision. ° °The clinic staff is available to answer your questions during regular business hours.  Please don’t hesitate to call and ask to speak to one of the nurses for clinical concerns.  If you have a medical emergency, go to the nearest emergency room or call 911.  A surgeon from [facility] is always on call at the hospital. ° °For further questions, please visit [email or web address]  °

## 2015-04-07 NOTE — Transfer of Care (Signed)
Immediate Anesthesia Transfer of Care Note  Patient: Martha Edwards  Procedure(s) Performed: Procedure(s): RIGHT BREAST LUMPECTOMY WITH RADIOACTIVE SEED LOCALIZATION (Right)  Patient Location: PACU  Anesthesia Type:General  Level of Consciousness: awake, alert  and oriented  Airway & Oxygen Therapy: Patient Spontanous Breathing and Patient connected to face mask oxygen  Post-op Assessment: Report given to RN and Post -op Vital signs reviewed and stable  Post vital signs: Reviewed and stable  Last Vitals: There were no vitals filed for this visit.  Complications: No apparent anesthesia complications

## 2015-04-07 NOTE — Anesthesia Postprocedure Evaluation (Signed)
Anesthesia Post Note  Patient: Martha Edwards  Procedure(s) Performed: Procedure(s) (LRB): RIGHT BREAST LUMPECTOMY WITH RADIOACTIVE SEED LOCALIZATION (Right)  Patient location during evaluation: PACU Anesthesia Type: General Level of consciousness: awake and alert Pain management: pain level controlled Vital Signs Assessment: post-procedure vital signs reviewed and stable Respiratory status: spontaneous breathing, nonlabored ventilation and respiratory function stable Cardiovascular status: blood pressure returned to baseline and stable Postop Assessment: no signs of nausea or vomiting Anesthetic complications: no    Last Vitals:  Filed Vitals:   04/07/15 1443 04/07/15 1445  BP:  131/74  Pulse: 58 51  Temp:    Resp: 14 14    Last Pain:  Filed Vitals:   04/07/15 1458  PainSc: 0-No pain                 Mitsue Peery A

## 2015-04-07 NOTE — Op Note (Signed)
Preoperative Diagnosis: cancer right breast  Postoprative Diagnosis: cancer right breast  Procedure: Procedure(s): RIGHT BREAST LUMPECTOMY WITH RADIOACTIVE SEED LOCALIZATION   Surgeon: Excell Seltzer T   Assistants: None  Anesthesia:  General LMA anesthesia  Indications: Patient is an 82 year old female with a recent screening detected invasive ductal cancer of the right breast, stage I clinically, T1b with a 1.8 cm low grade ER/PR positive invasive ductal carcinoma. After preoperative workup and discussion detailed elsewhere we have elected to proceed with radioactive seed localized right breast lumpectomy as initial surgical therapy.   Procedure Detail: Patient had previously undergone Accurate placement of a radioactive seed at the tumor and marking clip site in the upper right breast with the seed lying about 1/2 cm medial to the marking clip. Seed placement was confirmed with the neoprobe in the holding area. She was brought to the operating room, placed in the supine position on the operating table, and laryngeal mask general anesthesia induced. The right breast was widely sterilely prepped and draped. She received preoperative IV antibiotics. PAS were in place. Patient timeout was performed and correct procedure verified. The neoprobe was used to localize the seed in the upper right breast and it appeared quite close to the skin. A curvilinear elliptical incision was used just off the areolar border and I excised a portion of skin overlying the area of high counts. Short skin and subcutaneous flaps were raised and the dissection was deepened around the seed using the appropriate guides and carried down toward the chest wall excising a generous specimen of breast tissue around the seed. The specimen was inked for margins. Specimen x-ray showed the marking clip and seed centrally located within the specimen. Examination of the specimen had shown the seed quite anterior and even though I had  taken some skin I did biopsy the subcutaneous tissue widely just superior to the skin margin due to high counts in this area and this was sent as a separate specimen. The wound was inspected for hemostasis which was complete. The breast was mobilized superiorly and inferiorly off the chest wall and somewhat away from the skin for closure. The lumpectomy cavity was marked with clips. The deep breast and subcutaneous tissue was closed with interrupted 3-0 Vicryl. The skin was closed with a running subcuticular 5-0 Monocryl and Dermabond. Sponge needle and instrument counts were correct.    Findings: As above  Estimated Blood Loss:  Minimal         Drains: none  Blood Given: none          Specimens: #1 right breast lumpectomy    #2 biopsy of superior margin        Complications:  * No complications entered in OR log *         Disposition: PACU - hemodynamically stable.         Condition: stable

## 2015-04-07 NOTE — Interval H&P Note (Signed)
History and Physical Interval Note:  04/07/2015 12:24 PM  Martha Edwards  has presented today for surgery, with the diagnosis of cancer right breast  The various methods of treatment have been discussed with the patient and family. After consideration of risks, benefits and other options for treatment, the patient has consented to  Procedure(s): RIGHT BREAST LUMPECTOMY WITH RADIOACTIVE SEED LOCALIZATION (Right) as a surgical intervention .  The patient's history has been reviewed, patient examined, no change in status, stable for surgery.  I have reviewed the patient's chart and labs.  Questions were answered to the patient's satisfaction.     [name]

## 2015-04-07 NOTE — Anesthesia Preprocedure Evaluation (Addendum)
Anesthesia Evaluation  Patient identified by MRN, date of birth, ID band Patient awake    Reviewed: Allergy & Precautions, H&P , NPO status , Patient's Chart, lab work & pertinent test results, reviewed documented beta blocker date and time   Airway Mallampati: II  TM Distance: >3 FB Neck ROM: Full    Dental no notable dental hx. (+) Upper Dentures, Dental Advisory Given   Pulmonary neg pulmonary ROS, former smoker,    Pulmonary exam normal breath sounds clear to auscultation       Cardiovascular hypertension, Pt. on medications and Pt. on home beta blockers + Peripheral Vascular Disease  + dysrhythmias Atrial Fibrillation  Rhythm:Regular Rate:Normal     Neuro/Psych Anxiety negative neurological ROS     GI/Hepatic negative GI ROS, Neg liver ROS,   Endo/Other  negative endocrine ROS  Renal/GU Renal disease  negative genitourinary   Musculoskeletal  (+) Arthritis , Osteoarthritis,    Abdominal   Peds  Hematology negative hematology ROS (+)   Anesthesia Other Findings   Reproductive/Obstetrics negative OB ROS                           Anesthesia Physical Anesthesia Plan  ASA: III  Anesthesia Plan: General   Post-op Pain Management:    Induction: Intravenous  Airway Management Planned: LMA  Additional Equipment:   Intra-op Plan:   Post-operative Plan: Extubation in OR  Informed Consent: I have reviewed the patients History and Physical, chart, labs and discussed the procedure including the risks, benefits and alternatives for the proposed anesthesia with the patient or authorized representative who has indicated his/her understanding and acceptance.   Dental advisory given  Plan Discussed with: CRNA  Anesthesia Plan Comments:         Anesthesia Quick Evaluation

## 2015-04-07 NOTE — H&P (Signed)
History of Present Illness  The patient is a 82 year old female who presents with breast cancer. She is a post menopausal female referred by Dr. Ammie Ferrier for evaluation of recently diagnosed carcinoma of the right breast. She recently presented for a screening mamogram revealing a possible mass in the upper right breast. Subsequent imaging included diagnostic mamogram showing a persistent 1.3 cm irregular mass in the upper right breast at the 12:30 position in the anterior one third and ultrasound showing a 1.3 cm irregular mass. An ultrasound guided breast biopsy was performed on February 27, 2015 with pathology revealing invasive ductal carcinoma of the breast. She is seen now in the office for initial treatment planning. She has experienced no breast symptoms, specifically lump or pain, skin changes or nipple discharge. She does not have a personal history of any previous breast problems.  Findings at that time were the following: Tumor size: 1.3 cm Tumor grade: 1, Ki-67 10% Estrogen Receptor: Positive Progesterone Receptor: Positive Her-2 neu: Negative Lymph node status: Negative    Other Problems  Atrial Fibrillation High blood pressure Hypercholesterolemia  Past Surgical History  No pertinent past surgical history  Diagnostic Studies History  Colonoscopy 1-5 years ago Mammogram within last year  Allergies  No Known Drug Allergies  Medication History  Zolpidem Tartrate (10MG Tablet, Oral) Active. Warfarin Sodium (4MG Tablet, Oral) Active. Nystatin (100000 UNIT/ML Suspension, Mouth/Throat) Active. Metoprolol Succinate ER (25MG Tablet ER 24HR, Oral) Active. Atorvastatin Calcium (40MG Tablet, Oral) Active. AmLODIPine Besylate (2.5MG Tablet, Oral) Active. Alendronate Sodium (70MG Tablet, Oral) Active. Vitamin D3 Active. Tylenol (325MG Tablet, Oral) Active. Medications Reconciled  Social History  Alcohol use Recently quit alcohol use. Caffeine  use Carbonated beverages, Coffee. No drug use Tobacco use Former smoker.  Family History  Heart Disease Father, Mother. Prostate Cancer Brother.  Pregnancy / Birth History  Age at menarche 69 years. Gravida 3 Maternal age 35-25 Para 3    Review of Systems  Skin Not Present- Change in Wart/Mole, Dryness, Hives, Jaundice, New Lesions, Non-Healing Wounds, Rash and Ulcer. HEENT Present- Wears glasses/contact lenses. Not Present- Earache, Hearing Loss, Hoarseness, Nose Bleed, Oral Ulcers, Ringing in the Ears, Seasonal Allergies, Sinus Pain, Sore Throat, Visual Disturbances and Yellow Eyes. Cardiovascular Present- Shortness of Breath. Not Present- Chest Pain, Difficulty Breathing Lying Down, Leg Cramps, Palpitations, Rapid Heart Rate and Swelling of Extremities. Gastrointestinal Not Present- Abdominal Pain, Bloating, Bloody Stool, Change in Bowel Habits, Chronic diarrhea, Constipation, Difficulty Swallowing, Excessive gas, Gets full quickly at meals, Hemorrhoids, Indigestion, Nausea, Rectal Pain and Vomiting. Female Genitourinary Present- Frequency. Not Present- Nocturia, Painful Urination, Pelvic Pain and Urgency. Musculoskeletal Not Present- Back Pain, Joint Pain, Joint Stiffness, Muscle Pain, Muscle Weakness and Swelling of Extremities. Neurological Not Present- Decreased Memory, Fainting, Headaches, Numbness, Seizures, Tingling, Tremor, Trouble walking and Weakness. Psychiatric Not Present- Anxiety, Bipolar, Change in Sleep Pattern, Depression, Fearful and Frequent crying.  Vitals   Weight: 141 lb Height: 68in Body Surface Area: 1.76 m Body Mass Index: 21.44 kg/m  Temp.: 97.28F(Temporal)  Pulse: 73 (Regular)  BP: 124/68 (Sitting, Left Arm, Standard)       Physical Exam The physical exam findings are as follows: Note:General: Alert, well-developed and well nourished elderly African-American female, in no distress Skin: Warm and dry without rash or  infection. HEENT: No palpable masses or thyromegaly. Sclera nonicteric. Pupils equal round and reactive. Lymph nodes: No cervical, supraclavicular, or inguinal nodes palpable. Breasts: Some thickening upper right breast which could represent postbiopsy change. No other palpable  abnormalities in either breast. No skin changes. No nipple crusting or inversion. No palpable axillary adenopathy. Lungs: Breath sounds clear and equal. No wheezing or increased work of breathing. Cardiovascular: Regular rate and rhythm without murmer. No JVD or edema. Peripheral pulses intact. Abdomen: Nondistended. Soft and nontender. No masses palpable. No organomegaly. Extremities: No edema or joint swelling or deformity. No chronic venous stasis changes. Neurologic: Alert and fully oriented. Gait normal. No focal weakness. Psychiatric: Normal mood and affect. Thought content appropriate with normal judgement and insight    Assessment & Plan  CANCER OF RIGHT BREAST, STAGE 1 (C50.911) Impression: 82 year old female with a new diagnosis of cancer of the right breast, upper inner quadrant. Clinical stage 1A, ER positive, PR positive, HER-2 negative. I discussed with the patient and her daughter today initial surgical treatment options. We discussed options of breast conservation with lumpectomy or total mastectomy and sentinal lymph node biopsy/dissection. After discussion they have elected to proceed with radioactive seed localized right breast lumpectomy. With her age and low-grade cancer I would not recommend a lymph node biopsy and this was confirmed by medical oncology at breast cancer conference. We discussed the indications and nature of the procedure, and expected recovery, in detail. Surgical risks including anesthetic complications, cardiorespiratory complications, bleeding, infection, wound healing complications, blood clots, lymphedema, local and distant recurrence and possible need for further surgery based on the  final pathology was discussed and understood. Chemotherapy, hormonal therapy and radiation therapy have been discussed. They have been provided with literature regarding the treatment of breast cancer. All questions were answered. They understand and agree to proceed and we will go ahead with scheduling. We will obtain preoperative cardiac clearance from Dr. Tamala Julian and plan to stop her Coumadin 5 days in advance of surgery. Current Plans Pt Education - [facility] Breast Cancer Information Given - [employer] "Breast Journey" Package Radioactive seed localized right breast lumpectomy under general anesthesia as an outpatient

## 2015-04-08 ENCOUNTER — Encounter (HOSPITAL_BASED_OUTPATIENT_CLINIC_OR_DEPARTMENT_OTHER): Payer: Self-pay | Admitting: General Surgery

## 2015-04-17 ENCOUNTER — Ambulatory Visit (INDEPENDENT_AMBULATORY_CARE_PROVIDER_SITE_OTHER): Payer: Medicare Other | Admitting: Pharmacist

## 2015-04-17 DIAGNOSIS — I4891 Unspecified atrial fibrillation: Secondary | ICD-10-CM | POA: Diagnosis not present

## 2015-04-17 LAB — POCT INR: INR: 1.9

## 2015-04-22 ENCOUNTER — Encounter: Payer: Self-pay | Admitting: Interventional Cardiology

## 2015-04-22 DIAGNOSIS — I4891 Unspecified atrial fibrillation: Secondary | ICD-10-CM | POA: Diagnosis not present

## 2015-04-22 DIAGNOSIS — E785 Hyperlipidemia, unspecified: Secondary | ICD-10-CM | POA: Diagnosis not present

## 2015-04-22 DIAGNOSIS — M810 Age-related osteoporosis without current pathological fracture: Secondary | ICD-10-CM | POA: Diagnosis not present

## 2015-04-22 DIAGNOSIS — Z0000 Encounter for general adult medical examination without abnormal findings: Secondary | ICD-10-CM | POA: Diagnosis not present

## 2015-04-22 DIAGNOSIS — G479 Sleep disorder, unspecified: Secondary | ICD-10-CM | POA: Diagnosis not present

## 2015-04-22 DIAGNOSIS — Z79899 Other long term (current) drug therapy: Secondary | ICD-10-CM | POA: Diagnosis not present

## 2015-04-22 DIAGNOSIS — I10 Essential (primary) hypertension: Secondary | ICD-10-CM | POA: Diagnosis not present

## 2015-05-09 NOTE — Progress Notes (Signed)
Location of Breast Cancer: Right Breast  Histology per Pathology Report: 02/27/15 Diagnosis Breast, right, needle core biopsy, 12:30 o'clock - INVASIVE DUCTAL CARCINOMA. - DUCTAL CARCINOMA IN SITU. 04/07/15 Diagnosis 1. Breast, lumpectomy, right - INVASIVE DUCTAL CARCINOMA WITH CALCIFICATIONS, GRADE I/III, SPANNING 1.3 CM. - DUCTAL CARCINOMA IN SITU WITH CALCIFICATIONS, LOW GRADE. - THE SURGICAL RESECTION MARGINS ARE NEGATIVE FOR CARCINOMA. - SEE ONCOLOGY TABLE BELOW. 2. Breast, excision, right superior margin - BENIGN BREAST PARENCHYMA. - THERE IS NO EVIDENCE OF MALIGNANCY.  Receptor Status: ER(100%), PR (90%), Her2-neu (NEG), Ki-(10%)  Did patient present with symptoms or was this found on screening mammography?: It was found on a screening mammogram.   Past/Anticipated interventions by surgeon, if any: 04/07/15 RIGHT BREAST LUMPECTOMY WITH RADIOACTIVE SEED LOCALIZATION  By Dr. Excell Seltzer  She had a follow up with Dr. Excell Seltzer last week, and he reported that she is healing well.  Past/Anticipated interventions by medical oncology, if any:  She has an appointment with Dr. Jana Hakim today at 12:30  Lymphedema issues, if any:  No  Pain issues, if any:  No  SAFETY ISSUES:  Prior radiation? No  Pacemaker/ICD? No  Possible current pregnancy? No  Is the patient on methotrexate? No  Current Complaints / other details:    BP 153/65 mmHg  Pulse 49  Temp(Src) 97.9 F (36.6 C)  Ht 5' 7.5" (1.715 m)  Wt 144 lb 1.6 oz (65.363 kg)  BMI 22.22 kg/m2    Charlei Ramsaran, Stephani Police, RN 05/09/2015,3:45 PM

## 2015-05-14 ENCOUNTER — Encounter: Payer: Self-pay | Admitting: Oncology

## 2015-05-14 ENCOUNTER — Other Ambulatory Visit: Payer: Self-pay | Admitting: Oncology

## 2015-05-14 ENCOUNTER — Telehealth: Payer: Self-pay | Admitting: Oncology

## 2015-05-14 NOTE — Telephone Encounter (Signed)
Scheduled new patient appointment for 4/14 with GM @ 12 pm to arrive 11:30 am. Left message for patient to call me re appointment and also sent confirmation back to [facility] re appointment and to contact patient re appointment with GM.

## 2015-05-15 ENCOUNTER — Other Ambulatory Visit: Payer: Self-pay | Admitting: *Deleted

## 2015-05-15 ENCOUNTER — Telehealth: Payer: Self-pay | Admitting: *Deleted

## 2015-05-15 DIAGNOSIS — Z961 Presence of intraocular lens: Secondary | ICD-10-CM | POA: Diagnosis not present

## 2015-05-15 DIAGNOSIS — H26493 Other secondary cataract, bilateral: Secondary | ICD-10-CM | POA: Diagnosis not present

## 2015-05-15 DIAGNOSIS — C50919 Malignant neoplasm of unspecified site of unspecified female breast: Secondary | ICD-10-CM

## 2015-05-15 DIAGNOSIS — Z0100 Encounter for examination of eyes and vision without abnormal findings: Secondary | ICD-10-CM | POA: Diagnosis not present

## 2015-05-15 DIAGNOSIS — H35373 Puckering of macula, bilateral: Secondary | ICD-10-CM | POA: Diagnosis not present

## 2015-05-15 NOTE — Telephone Encounter (Signed)
Spoke with patient this morning re new patient appointment with GM 4/14/207 at 12 pm to arrive 11:30 am. Patient also has a consult in radonc at 8 am and is aware of both appointments. Demographic and insurance information confirmed.

## 2015-05-15 NOTE — Telephone Encounter (Signed)
Received appt date/time from Melissa.  Placed a note for an intake form to be given to the pt at time of check in. 

## 2015-05-16 ENCOUNTER — Ambulatory Visit
Admission: RE | Admit: 2015-05-16 | Discharge: 2015-05-16 | Disposition: A | Discharge status: Home / Self Care | Payer: Medicare Other | Source: Ambulatory Visit | Attending: Radiation Oncology | Admitting: Radiation Oncology

## 2015-05-16 ENCOUNTER — Ambulatory Visit: Payer: Medicare Other | Admitting: Oncology

## 2015-05-16 ENCOUNTER — Telehealth: Payer: Self-pay | Admitting: Oncology

## 2015-05-16 ENCOUNTER — Other Ambulatory Visit (HOSPITAL_BASED_OUTPATIENT_CLINIC_OR_DEPARTMENT_OTHER): Payer: Medicare Other

## 2015-05-16 ENCOUNTER — Encounter: Payer: Self-pay | Admitting: Radiation Oncology

## 2015-05-16 VITALS — BP 154/65 | HR 57 | Temp 97.5°F | Resp 18 | Ht 67.5 in | Wt 143.1 lb

## 2015-05-16 VITALS — BP 153/65 | HR 49 | Temp 97.9°F | Ht 67.5 in | Wt 144.1 lb

## 2015-05-16 DIAGNOSIS — E7800 Pure hypercholesterolemia, unspecified: Secondary | ICD-10-CM | POA: Diagnosis not present

## 2015-05-16 DIAGNOSIS — I129 Hypertensive chronic kidney disease with stage 1 through stage 4 chronic kidney disease, or unspecified chronic kidney disease: Secondary | ICD-10-CM | POA: Insufficient documentation

## 2015-05-16 DIAGNOSIS — C50211 Malignant neoplasm of upper-inner quadrant of right female breast: Secondary | ICD-10-CM | POA: Diagnosis present

## 2015-05-16 DIAGNOSIS — F419 Anxiety disorder, unspecified: Secondary | ICD-10-CM | POA: Insufficient documentation

## 2015-05-16 DIAGNOSIS — Z87891 Personal history of nicotine dependence: Secondary | ICD-10-CM | POA: Insufficient documentation

## 2015-05-16 DIAGNOSIS — M8580 Other specified disorders of bone density and structure, unspecified site: Secondary | ICD-10-CM | POA: Diagnosis not present

## 2015-05-16 DIAGNOSIS — N3281 Overactive bladder: Secondary | ICD-10-CM | POA: Insufficient documentation

## 2015-05-16 DIAGNOSIS — C50919 Malignant neoplasm of unspecified site of unspecified female breast: Secondary | ICD-10-CM

## 2015-05-16 DIAGNOSIS — I4892 Unspecified atrial flutter: Secondary | ICD-10-CM | POA: Diagnosis not present

## 2015-05-16 DIAGNOSIS — I340 Nonrheumatic mitral (valve) insufficiency: Secondary | ICD-10-CM | POA: Diagnosis not present

## 2015-05-16 LAB — COMPREHENSIVE METABOLIC PANEL
ALBUMIN: 4 g/dL (ref 3.5–5.0)
ALK PHOS: 48 U/L (ref 40–150)
ALT: 12 U/L (ref 0–55)
AST: 19 U/L (ref 5–34)
Anion Gap: 9 mEq/L (ref 3–11)
BILIRUBIN TOTAL: 0.49 mg/dL (ref 0.20–1.20)
BUN: 13.2 mg/dL (ref 7.0–26.0)
CO2: 25 mEq/L (ref 22–29)
CREATININE: 1.1 mg/dL (ref 0.6–1.1)
Calcium: 9.8 mg/dL (ref 8.4–10.4)
Chloride: 107 mEq/L (ref 98–109)
EGFR: 58 mL/min/{1.73_m2} — AB (ref >90)
GLUCOSE: 87 mg/dL (ref 70–140)
Potassium: 4.1 mEq/L (ref 3.5–5.1)
SODIUM: 141 meq/L (ref 136–145)
TOTAL PROTEIN: 7.4 g/dL (ref 6.4–8.3)

## 2015-05-16 LAB — CBC WITH DIFFERENTIAL/PLATELET
BASO%: 0.3 % (ref 0.0–2.0)
Basophils Absolute: 0 10*3/uL (ref 0.0–0.1)
EOS ABS: 0.1 10*3/uL (ref 0.0–0.5)
EOS%: 1.9 % (ref 0.0–7.0)
HCT: 40.8 % (ref 34.8–46.6)
HEMOGLOBIN: 13.2 g/dL (ref 11.6–15.9)
LYMPH%: 27.1 % (ref 14.0–49.7)
MCH: 28.4 pg (ref 25.1–34.0)
MCHC: 32.4 g/dL (ref 31.5–36.0)
MCV: 87.9 fL (ref 79.5–101.0)
MONO#: 0.5 10*3/uL (ref 0.1–0.9)
MONO%: 7.2 % (ref 0.0–14.0)
NEUT%: 63.5 % (ref 38.4–76.8)
NEUTROS ABS: 4.6 10*3/uL (ref 1.5–6.5)
PLATELETS: 190 10*3/uL (ref 145–400)
RBC: 4.64 10*6/uL (ref 3.70–5.45)
RDW: 15 % — AB (ref 11.2–14.5)
WBC: 7.2 10*3/uL (ref 3.9–10.3)
lymph#: 2 10*3/uL (ref 0.9–3.3)

## 2015-05-16 NOTE — Telephone Encounter (Signed)
appt made and avs printed °

## 2015-05-16 NOTE — Addendum Note (Signed)
Encounter addended by: Ernst Spell, RN on: 05/16/2015 11:16 AM<BR>     Documentation filed: Charges VN

## 2015-05-16 NOTE — Progress Notes (Signed)
Radiation Oncology         [phone] ________________________________  Initial outpatient Consultation  Name: Martha Edwards MRN: [id number]  Date: 05/16/2015  DOB: 1932/04/29  UM:PNTIRWE,RXVQMG A, MD  Excell Seltzer, MD   REFERRING PHYSICIAN: Excell Seltzer, MD   DIAGNOSIS:    ICD-9-CM ICD-10-CM   1. Carcinoma of upper-inner quadrant of right female breast ([place]) 174.2 C50.211    Stage I pT1c cN0M0 Right Breast  Invasive Ductal Carcinoma, ER 100% / PR 90% / Her2 negative, Grade 1  HISTORY OF PRESENT ILLNESS::Martha Edwards is a 82 y.o. female who presented for screening mammogram on 01/20/15 and was found to have suspicious mass in the right breast. [place] on 02/19/15 confirmed a 1.3 cm mass in the 12:30 o'clock position of the right breast. The patient proceeded with right breast lumpectomy on 04/07/15 with Dr.Hoxworth. Surgical pathology indicated a stage I, T1b, 1.3 cm low grade ER/PR positive invasive ductal carcinoma. Surgical margins were negative.   Retired 22 years ago, lives in [place], used to work in post office. Lives by herself and is able to drive. Quit smoking decades ago.  PREVIOUS RADIATION THERAPY: No  PAST MEDICAL HISTORY:  has a past medical history of Anxiety; Hypercholesterolemia; Diverticula, colon; Overactive bladder; Osteopenia; Hypertension; Carotid artery plaque; Atrial flutter ([place]); Hypertonicity of bladder; Insomnia; Anticoagulated; Mitral regurgitation; Arthritis of hand; and CKD (chronic kidney disease) stage 2, GFR 60-89 ml/min.     PAST SURGICAL HISTORY: Past Surgical History  Procedure Laterality Date  . Colonoscopy  2001    2007 sigmoidoscopy   . Breast lumpectomy with radioactive seed localization Right 04/07/2015    Procedure: RIGHT BREAST LUMPECTOMY WITH RADIOACTIVE SEED LOCALIZATION;  Surgeon: Excell Seltzer, MD;  Location: [facility];  Service: General;  Laterality: Right;    FAMILY HISTORY: family history includes Breast  cancer in her sister; Diabetes in her mother; Liver cancer in her mother; Prostate cancer in her brother. There is no history of Heart attack or Stroke.  SOCIAL HISTORY:  reports that she quit smoking about 31 years ago. She has never used smokeless tobacco.  ALLERGIES: Review of patient's allergies indicates no known allergies.  MEDICATIONS:  Current Outpatient Prescriptions  Medication Sig Dispense Refill  . acetaminophen (TYLENOL) 325 MG tablet Take 650 mg by mouth every 6 (six) hours as needed for mild pain.    Marland Kitchen alendronate (FOSAMAX) 70 MG tablet Take 70 mg by mouth once a week. Take with a full glass of water on an empty stomach.    Marland Kitchen amLODipine (NORVASC) 2.5 MG tablet TAKE 1 TABLET BY MOUTH DAILY. 30 tablet 9  . atorvastatin (LIPITOR) 40 MG tablet Take 40 mg by mouth daily.    . Cholecalciferol (VITAMIN D3 PO) Take 1 tablet by mouth daily.    . metoprolol succinate (TOPROL-XL) 25 MG 24 hr tablet Take 12.5 mg by mouth daily.    Marland Kitchen warfarin (COUMADIN) 4 MG tablet TAKE 1 AND 1/2 TABLETS BY MOUTH ALL DAYS OF THE WEEK OR AS DIRECTED. 45 tablet 3  . zolpidem (AMBIEN) 10 MG tablet Take 5 mg by mouth at bedtime as needed for sleep.    Marland Kitchen HYDROcodone-acetaminophen (NORCO) 5-325 MG tablet Take 1-2 tablets by mouth every 4 (four) hours as needed. (Patient not taking: Reported on 05/16/2015) 30 tablet 0   No current facility-administered medications for this encounter.    REVIEW OF SYSTEMS:  Notable for that above.   PHYSICAL EXAM:  height is 5' 7.5" (  1.715 m) and weight is 144 lb 1.6 oz (65.363 kg). Her temperature is 97.9 F (36.6 C). Her blood pressure is 153/65 and her pulse is 49.   General: Alert and oriented, in no acute distress HEENT: Head is normocephalic. Extraocular movements are intact. Oropharynx is clear. Neck: Neck is supple, no palpable cervical or supraclavicular lymphadenopathy. Heart: Regular in rate and rhythm with no murmurs, rubs, or gallops. Chest: Clear to auscultation  bilaterally, with no rhonchi, wheezes, or rales. Abdomen: Soft, nontender, nondistended, with no rigidity or guarding. Extremities: No cyanosis or edema. Lymphatics: see Neck Exam Skin: No concerning lesions. Musculoskeletal: symmetric strength and muscle tone throughout. Neurologic: Cranial nerves II through XII are grossly intact. No obvious focalities. Speech is fluent. Coordination is intact. Psychiatric: Judgment and insight are intact. Affect is appropriate. Breasts: Central right lumpectomy scar has healed well. No other palpable masses appreciated in the breasts or axillae .  ECOG = 0  0 - Asymptomatic (Fully active, able to carry on all predisease activities without restriction)  1 - Symptomatic but completely ambulatory (Restricted in physically strenuous activity but ambulatory and able to carry out work of a light or sedentary nature. For example, light housework, office work)  2 - Symptomatic, <50% in bed during the day (Ambulatory and capable of all self care but unable to carry out any work activities. Up and about more than 50% of waking hours)  3 - Symptomatic, >50% in bed, but not bedbound (Capable of only limited self-care, confined to bed or chair 50% or more of waking hours)  4 - Bedbound (Completely disabled. Cannot carry on any self-care. Totally confined to bed or chair)  5 - Death   Eustace Pen MM, Creech RH, Tormey DC, et al. [phone]). "Toxicity and response criteria of the [facility]". [place] Oncol. 5 (6): 649-55  LABORATORY DATA:  No results found for: WBC, HGB, HCT, MCV, PLT CMP  No results found for: NA, K, CL, CO2, GLUCOSE, BUN, CREATININE, CALCIUM, PROT, ALBUMIN, AST, ALT, ALKPHOS, BILITOT, GFRNONAA, GFRAA    RADIOGRAPHY: No results found.    IMPRESSION/PLAN: Stage 1 Right Breast  Invasive Ductal Carcinoma  We discussed the options regarding adjuvant radiation therapy versus anti-estrogen therapy. We spoke about 3.5 weeks of  right breast radiation therapy. We discussed the small benefit in local control by adding radiation therapy to anti-estrogen therapy. She understands that if she takes anti estrogen therapy, proceeding with RT as well will not improve her life expectancy. She meets with Dr.Magrinat today to discuss anti-estrogen therapy. She will consider her options. She has my contact info and will contact me next week when she has made a decision. I won't schedule anything until I hear from her. I think her prognosis is excellent if she takes EITHER adjuvant modality.  It was a pleasure meeting the patient today. We discussed the risks, benefits, and side effects of radiotherapy. We discussed that radiation would take approximately 3.5 weeks to complete and that I would give the patient a few weeks to heal following surgery before starting treatment planning. We spoke about acute effects including skin irritation and fatigue as well as much less common late effects including internal organ injury or irritation. We spoke about the latest technology that is used to minimize the risk of late effects for patients undergoing radiotherapy to the breast or chest wall. No guarantees of treatment were given. Consent signed today.  I gave her my card; she'll call with her decision  next week.   __________________________________________   Eppie Gibson, MD    This document serves as a record of services personally performed by Eppie Gibson, MD. It was created on her behalf by Derek Mound, a trained medical scribe. The creation of this record is based on the scribe's personal observations and the provider's statements to them. This document has been checked and approved by the attending provider.

## 2015-05-16 NOTE — Progress Notes (Signed)
No show

## 2015-05-19 ENCOUNTER — Telehealth: Payer: Self-pay | Admitting: *Deleted

## 2015-05-19 DIAGNOSIS — C50211 Malignant neoplasm of upper-inner quadrant of right female breast: Secondary | ICD-10-CM

## 2015-05-19 MED ORDER — ANASTROZOLE 1 MG PO TABS: 1.0000 mg | ORAL_TABLET | Freq: Every day | ORAL | Status: DC

## 2015-05-19 NOTE — Telephone Encounter (Signed)
Called pt to give introduction and to discuss navigation resources. Pt relate doing well and without needs. Relate she discussed options with daughter and would like to take "the pill" instead of radiation. Informed pt will send in anastrozole to [facility]. Informed physician team of pt decision.  Denies further needs or concerns at this time.Encourage pt to call with questions. Contact information given.

## 2015-05-19 NOTE — Telephone Encounter (Signed)
Called both home and mobile numbers. Unable to leave msg. Will call again to give navigation resources as well as discuss plan.

## 2015-05-21 ENCOUNTER — Other Ambulatory Visit: Payer: Self-pay | Admitting: Oncology

## 2015-05-21 ENCOUNTER — Encounter: Payer: Self-pay | Admitting: Nurse Practitioner

## 2015-05-21 DIAGNOSIS — C50211 Malignant neoplasm of upper-inner quadrant of right female breast: Secondary | ICD-10-CM

## 2015-05-21 NOTE — Progress Notes (Signed)
The Survivorship Care Plan was mailed to Martha Edwards as she reported not being able to come in to the Survivorship Clinic for an in-person visit at this time. A letter was mailed to her outlining the purpose of the content of the care plan, as well as encouraging her to reach out to me with any questions or concerns.  My business card was included in the correspondence to the patient as well.  A copy of the care plan was also routed/faxed/mailed to Martha Coma, MD, the patient's PCP.  I will not be placing any follow-up appointments to the Survivorship Clinic for Martha Edwards, but I am happy to see her at any time in the future for any survivorship concerns that may arise. Thank you for allowing me to participate in her care!  Kenn File, [place] [phone]

## 2015-05-22 ENCOUNTER — Ambulatory Visit (INDEPENDENT_AMBULATORY_CARE_PROVIDER_SITE_OTHER): Payer: Medicare Other | Admitting: *Deleted

## 2015-05-22 DIAGNOSIS — I4891 Unspecified atrial fibrillation: Secondary | ICD-10-CM | POA: Diagnosis not present

## 2015-05-22 LAB — POCT INR: INR: 2.5

## 2015-05-27 DIAGNOSIS — H26492 Other secondary cataract, left eye: Secondary | ICD-10-CM | POA: Diagnosis not present

## 2015-06-04 ENCOUNTER — Other Ambulatory Visit: Payer: Self-pay | Admitting: Interventional Cardiology

## 2015-07-03 ENCOUNTER — Ambulatory Visit (INDEPENDENT_AMBULATORY_CARE_PROVIDER_SITE_OTHER): Payer: Medicare Other | Admitting: *Deleted

## 2015-07-03 DIAGNOSIS — I4891 Unspecified atrial fibrillation: Secondary | ICD-10-CM

## 2015-07-03 LAB — POCT INR: INR: 2.8

## 2015-08-12 ENCOUNTER — Telehealth: Payer: Self-pay | Admitting: Oncology

## 2015-08-12 ENCOUNTER — Ambulatory Visit (INDEPENDENT_AMBULATORY_CARE_PROVIDER_SITE_OTHER): Payer: Medicare Other

## 2015-08-12 ENCOUNTER — Ambulatory Visit (HOSPITAL_BASED_OUTPATIENT_CLINIC_OR_DEPARTMENT_OTHER): Payer: Medicare Other | Admitting: Oncology

## 2015-08-12 VITALS — BP 140/69 | HR 50 | Temp 98.3°F | Resp 18 | Ht 67.5 in | Wt 141.5 lb

## 2015-08-12 DIAGNOSIS — Z808 Family history of malignant neoplasm of other organs or systems: Secondary | ICD-10-CM

## 2015-08-12 DIAGNOSIS — C50211 Malignant neoplasm of upper-inner quadrant of right female breast: Secondary | ICD-10-CM

## 2015-08-12 DIAGNOSIS — Z803 Family history of malignant neoplasm of breast: Secondary | ICD-10-CM

## 2015-08-12 DIAGNOSIS — Z87891 Personal history of nicotine dependence: Secondary | ICD-10-CM | POA: Diagnosis not present

## 2015-08-12 DIAGNOSIS — I4891 Unspecified atrial fibrillation: Secondary | ICD-10-CM | POA: Diagnosis not present

## 2015-08-12 DIAGNOSIS — Z8042 Family history of malignant neoplasm of prostate: Secondary | ICD-10-CM

## 2015-08-12 DIAGNOSIS — M8580 Other specified disorders of bone density and structure, unspecified site: Secondary | ICD-10-CM

## 2015-08-12 LAB — POCT INR: INR: 2.3

## 2015-08-12 NOTE — Progress Notes (Signed)
[facility]  Telephone:([phone] [phone]) [phone]     ID: Crystalann Korf Grover DOB: December 02, 1976  MR#: [id number]  PPJ#:[id number]  Patient Care Team: [name], MD as PCP - General (Family Medicine) Excell Seltzer, MD as Consulting Physician (General Surgery) Chauncey Cruel, MD as Consulting Physician (Oncology) Eppie Gibson, MD as Attending Physician (Radiation Oncology) Sylvan Cheese, NP as Nurse Practitioner (Hematology and Oncology) PCP: Lilian Coma, MD Chauncey Cruel, MD GYN: SU:  OTHER MD:  CHIEF COMPLAINT: Estrogen receptor positive breast cancer  CURRENT TREATMENT: Anastrozole   BREAST CANCER HISTORY: The patient had routine screening mammography at [facility] 01/20/2015 showing a possible mass in the right breast. On 02/19/2015 she returned for right diagnostic mammography with tomosynthesis and right breast ultrasonography. The breast density was category B. In the upper inner quadrant of the right breast there was a 1.3 cm irregular mass which was palpable by exam. Ultrasonography measured this at 1.3 cm maximally. Ultrasound of the right axilla was unremarkable.  Biopsy of this mass 02/27/2015 showed ([id number]) an invasive ductal carcinoma, grade 1 or 2, 100% estrogen receptor positive, with strong staining intensity; 90% progesterone receptor positive with moderate staining intensity, with an MIB-1 of 10%, and no HER-2 implication, the signals ratio 1.15 and the number per cell 1.95.  The case was discussed at breast cancer conference and it was decided sentinel lymph node sampling and Oncotype would not be needed. The patient proceeded to right lumpectomy 04/07/2015, with the final pathology ([id number]) showing an invasive ductal carcinoma, grade 1, measuring 1.3 cm. Margins were negative. Repeat HER-2 was again negative, with a signals ratio of 1.43, the number per cell being 2.00.  The patient met with radiation  oncology 05/16/2015. At that time the possibility of adjuvant radiation versus anti-estrogens only was discussed. The patient was supposed to have seen me after that visit but she did not show. She subsequently called and said that she would like "pill" rather than radiation and was started on anastrozole April 2017.  Her subsequent history is as detailed below  INTERVAL HISTORY: This is my first meeting with Ms.Buller. She is establishing herself in my practice today  She was started on anastrozole April 2017.   Hot flashes and vaginal dryness are not a major issue. She never developed the arthralgias or myalgias that many patients can experience on this medication. She obtains it at a good price.  REVIEW OF SYSTEMS: There were no specific symptoms leading to the original mammogram, which was routinely scheduled. The patient denies unusual headaches, visual changes, nausea, vomiting, stiff neck, dizziness, or gait imbalance. There has been no cough, phlegm production, or pleurisy, no chest pain or pressure, and no change in bowel or bladder habits. The patient denies fever, rash, bleeding, unexplained fatigue or unexplained weight loss. The patient takes walk at least 2 days a week and [facility]. A detailed review of systems was otherwise entirely negative.  PAST MEDICAL HISTORY: Past Medical History  Diagnosis Date  . Anxiety   . Hypercholesterolemia   . Diverticula, colon   . Overactive bladder   . Osteopenia     > osteoporosis  . Hypertension     with LVH on echo 06/03/11  . Carotid artery plaque     mild bilateral carotid plaque without significant stenosis (<20% in left ICA), 04/2010  . Atrial flutter ([place])     with LVEF 60%, LVH, LAE and holter with  rate control and Ave HR 60 bpm   . Hypertonicity of bladder   . Insomnia   . Anticoagulated   . Mitral regurgitation   . Arthritis of hand   . CKD (chronic kidney disease) stage 2, GFR 60-89 ml/min     PAST SURGICAL  HISTORY: Past Surgical History  Procedure Laterality Date  . Colonoscopy  2001    2007 sigmoidoscopy   . Breast lumpectomy with radioactive seed localization Right 04/07/2015    Procedure: RIGHT BREAST LUMPECTOMY WITH RADIOACTIVE SEED LOCALIZATION;  Surgeon: Excell Seltzer, MD;  Location: [facility];  Service: General;  Laterality: Right;    FAMILY HISTORY Family History  Problem Relation Age of Onset  . Diabetes Mother   . Liver cancer Mother   . Breast cancer Sister   . Prostate cancer Brother   . Heart attack Neg Hx   . Stroke Neg Hx   Patient's father died in his 48s from causes unknown to the patient. The patient's mother died at the age of 45 with what may have been breast cancer. The patient had 2 brothers one sister. One sister was diagnosed with breast cancer in her early 40s.  GYNECOLOGIC HISTORY:  No LMP recorded. Patient is postmenopausal. Menarche age 82, first live birth age 82, the patient is [place] P3. She does not recall when she went through menopause. She never took hormone replacement or oral contraceptives  SOCIAL HISTORY:  The patient worked at [employer] for many years. She is now retired. She is widowed, lives by herself, with no pets. Her daughter [name] is an Optometrist for the [employer] system. Son [name] lives in [place] and is disabled. Son [name] lives in [place] and works for [employer]. The patient has 4 grandchildren and 2 great-grandchildren.    ADVANCED DIRECTIVES: The patient tells me her daughter Olin Hauser is her healthcare part of attorney. She can be reached at [place]: Social History  Substance Use Topics  . Smoking status: Former Smoker    Quit date: 02/02/1984  . Smokeless tobacco: Never Used  . Alcohol Use: Not on file     Colonoscopy:  PAP:  Bone density:   No Known Allergies  Current Outpatient Prescriptions  Medication Sig Dispense Refill  . acetaminophen  (TYLENOL) 325 MG tablet Take 650 mg by mouth every 6 (six) hours as needed for mild pain.    Marland Kitchen alendronate (FOSAMAX) 70 MG tablet Take 70 mg by mouth once a week. Take with a full glass of water on an empty stomach.    Marland Kitchen amLODipine (NORVASC) 2.5 MG tablet TAKE 1 TABLET BY MOUTH DAILY. 30 tablet 9  . anastrozole (ARIMIDEX) 1 MG tablet Take 1 tablet (1 mg total) by mouth daily. 30 tablet 12  . atorvastatin (LIPITOR) 40 MG tablet Take 40 mg by mouth daily.    . Cholecalciferol (VITAMIN D3 PO) Take 1 tablet by mouth daily.    Marland Kitchen HYDROcodone-acetaminophen (NORCO) 5-325 MG tablet Take 1-2 tablets by mouth every 4 (four) hours as needed. (Patient not taking: Reported on 05/16/2015) 30 tablet 0  . metoprolol succinate (TOPROL-XL) 25 MG 24 hr tablet Take 12.5 mg by mouth daily.    Marland Kitchen warfarin (COUMADIN) 4 MG tablet Take as directed by [facility] 45 tablet 3  . zolpidem (AMBIEN) 10 MG tablet Take 5 mg by mouth at bedtime as needed for sleep.     No current facility-administered medications for this visit.  OBJECTIVE: Older African-American woman who appears well Filed Vitals:   08/12/15 1142  BP: 140/69  Pulse: 50  Temp: 98.3 F (36.8 C)  Resp: 18     Body mass index is 21.82 kg/(m^2).    ECOG FS:0 - Asymptomatic  Ocular: Sclerae unicteric, EOMs intact Ear-nose-throat: Oropharynx clear and moist Lymphatic: No cervical or supraclavicular adenopathy Lungs no rales or rhonchi, good excursion bilaterally Heart regular rate and rhythm, no murmur appreciated Abd soft, nontender, positive bowel sounds MSK no focal spinal tenderness, no joint edema Neuro: non-focal, well-oriented, appropriate affect Breasts: The right breast is status post lumpectomy. There is no evidence of local recurrence. The cosmetic result is excellent. The right axilla is benign. The left breast is unremarkable.   LAB RESULTS:  CMP     Component Value Date/Time   NA 141 05/16/2015 1150   K 4.1 05/16/2015 1150   CO2  25 05/16/2015 1150   GLUCOSE 87 05/16/2015 1150   BUN 13.2 05/16/2015 1150   CREATININE 1.1 05/16/2015 1150   CALCIUM 9.8 05/16/2015 1150   PROT 7.4 05/16/2015 1150   ALBUMIN 4.0 05/16/2015 1150   AST 19 05/16/2015 1150   ALT 12 05/16/2015 1150   ALKPHOS 48 05/16/2015 1150   BILITOT 0.49 05/16/2015 1150    INo results found for: SPEP, UPEP  Lab Results  Component Value Date   WBC 7.2 05/16/2015   NEUTROABS 4.6 05/16/2015   HGB 13.2 05/16/2015   HCT 40.8 05/16/2015   MCV 87.9 05/16/2015   PLT 190 05/16/2015      Chemistry      Component Value Date/Time   NA 141 05/16/2015 1150   K 4.1 05/16/2015 1150   CO2 25 05/16/2015 1150   BUN 13.2 05/16/2015 1150   CREATININE 1.1 05/16/2015 1150      Component Value Date/Time   CALCIUM 9.8 05/16/2015 1150   ALKPHOS 48 05/16/2015 1150   AST 19 05/16/2015 1150   ALT 12 05/16/2015 1150   BILITOT 0.49 05/16/2015 1150       No results found for: LABCA2  No components found for: LABCA125   Recent Labs Lab 08/12/15 1042  INR 2.3    Urinalysis No results found for: COLORURINE, APPEARANCEUR, LABSPEC, PHURINE, GLUCOSEU, HGBUR, BILIRUBINUR, KETONESUR, PROTEINUR, UROBILINOGEN, NITRITE, LEUKOCYTESUR   STUDIES: No results found.  ELIGIBLE FOR AVAILABLE RESEARCH PROTOCOL: no  ASSESSMENT: 82 y.o. [place] woman status post right breast upper inner quadrant lumpectomy 04/07/2015 for a pT1c cN0, stage IA invasive ductal carcinoma, grade 1, estrogen and progesterone receptor positive, HER-2 nonamplified, with an MIB-1 of 10%.  (1) the patient opted against adjuvant radiation   (2) started anastrozole April 2017   (a) Bone density scheduled for January 2018  (3) may qualify for genetics counseling   PLAN: The patient was supposed to have met me in April to discuss anti-estrogens, but was started on anastrozole then and did not show for her appointment, so she never had a full discussion of this medication. That was done  today over approximately 50 minutes.Marland Kitchen  Specifically, Shanikia is not a good candidate for tamoxifen because of her cardiovascular history. She is a good candidate for anastrozole and the one concern we have, since she appears to be tolerating it so well, is a bone density issues.  She exercises regularly chiefly by walking and this is very encouraging. She is also on vitamin D supplementation. I set her up for a bone density in January when she has her next  mammography   Today we also discussed  the biology of breast cancer in general, and the specifics of the patient's tumor in particular. She understands she had a small, nonaggressive, slow-growing invasive ductal carcinoma, and that her risk of recurrence with no treatment beyond surgery would likely be low. By taking anti-estrogens she is getting better already low risk in half. She was encouraged by this discussion.  The plan then is for anastrozole for 5 years. If we need to be concern regarding osteopenia or osteoporosis based on the upcoming bone density we will consider Prolia.  Ms Gindlesperger has a good understanding of the overall plan. She agrees with it. She knows the goal of treatment in her case is cure. She will call with any problems that may develop before her next visit here.  Chauncey Cruel, MD   08/12/2015 12:06 PM Medical Oncology and Hematology [facility] [address] [place], [place] [address] Tel. [phone]    Fax. [phone]

## 2015-08-12 NOTE — Telephone Encounter (Signed)
appt made and avs print. Staff message sent to [facility] to correct mammo order to schedule appt

## 2015-08-14 ENCOUNTER — Other Ambulatory Visit: Payer: Self-pay | Admitting: Oncology

## 2015-08-14 DIAGNOSIS — C50211 Malignant neoplasm of upper-inner quadrant of right female breast: Secondary | ICD-10-CM

## 2015-09-23 ENCOUNTER — Ambulatory Visit (INDEPENDENT_AMBULATORY_CARE_PROVIDER_SITE_OTHER): Payer: Medicare Other | Admitting: Pharmacist

## 2015-09-23 DIAGNOSIS — I4891 Unspecified atrial fibrillation: Secondary | ICD-10-CM

## 2015-09-23 LAB — POCT INR: INR: 2.6

## 2015-10-30 ENCOUNTER — Other Ambulatory Visit: Payer: Self-pay | Admitting: Interventional Cardiology

## 2015-11-04 ENCOUNTER — Encounter: Payer: Self-pay | Admitting: Interventional Cardiology

## 2015-11-04 ENCOUNTER — Ambulatory Visit (INDEPENDENT_AMBULATORY_CARE_PROVIDER_SITE_OTHER): Payer: Medicare Other | Admitting: *Deleted

## 2015-11-04 ENCOUNTER — Ambulatory Visit (INDEPENDENT_AMBULATORY_CARE_PROVIDER_SITE_OTHER): Payer: Medicare Other | Admitting: Interventional Cardiology

## 2015-11-04 VITALS — BP 150/72 | HR 50 | Ht 68.0 in | Wt 142.8 lb

## 2015-11-04 DIAGNOSIS — I4891 Unspecified atrial fibrillation: Secondary | ICD-10-CM

## 2015-11-04 DIAGNOSIS — I10 Essential (primary) hypertension: Secondary | ICD-10-CM

## 2015-11-04 DIAGNOSIS — I4892 Unspecified atrial flutter: Secondary | ICD-10-CM

## 2015-11-04 DIAGNOSIS — I495 Sick sinus syndrome: Secondary | ICD-10-CM | POA: Diagnosis not present

## 2015-11-04 DIAGNOSIS — Z7901 Long term (current) use of anticoagulants: Secondary | ICD-10-CM | POA: Diagnosis not present

## 2015-11-04 LAB — POCT INR: INR: 2.8

## 2015-11-04 NOTE — Progress Notes (Signed)
Cardiology Office Note    Date:  11/04/2015   ID:  Martha Edwards, DOB 12-08-1932, MRN [id number]  PCP:  Lilian Coma, MD  Cardiologist: Sinclair Grooms, MD   Chief Complaint  Patient presents with  . Atrial Fibrillation    History of Present Illness:  Martha Edwards is a 82 y.o. female with the tachybradycardia syndrome, PAF, hypertension, and chronic anticoagulation therapy.  She is asymptomatic. No bleeding on Coumadin. No neurological complaints. She denies syncope. She denies palpitations.  Past Medical History:  Diagnosis Date  . Anticoagulated   . Anxiety   . Arthritis of hand   . Atrial flutter ([place])    with LVEF 60%, LVH, LAE and holter with rate control and Ave HR 60 bpm   . Carotid artery plaque    mild bilateral carotid plaque without significant stenosis (<20% in left ICA), 04/2010  . CKD (chronic kidney disease) stage 2, GFR 60-89 ml/min   . Diverticula, colon   . Hypercholesterolemia   . Hypertension    with LVH on echo 06/03/11  . Hypertonicity of bladder   . Insomnia   . Mitral regurgitation   . Osteopenia    > osteoporosis  . Overactive bladder     Past Surgical History:  Procedure Laterality Date  . BREAST LUMPECTOMY WITH RADIOACTIVE SEED LOCALIZATION Right 04/07/2015   Procedure: RIGHT BREAST LUMPECTOMY WITH RADIOACTIVE SEED LOCALIZATION;  Surgeon: Excell Seltzer, MD;  Location: [facility];  Service: General;  Laterality: Right;  . COLONOSCOPY  2001   2007 sigmoidoscopy     Current Medications: Outpatient Medications Prior to Visit  Medication Sig Dispense Refill  . acetaminophen (TYLENOL) 325 MG tablet Take 650 mg by mouth every 6 (six) hours as needed for mild pain.    Marland Kitchen alendronate (FOSAMAX) 70 MG tablet Take 70 mg by mouth once a week. Take with a full glass of water on an empty stomach.    Marland Kitchen amLODipine (NORVASC) 2.5 MG tablet TAKE 1 TABLET BY MOUTH DAILY. 30 tablet 9  . anastrozole (ARIMIDEX) 1 MG tablet Take 1 tablet (1  mg total) by mouth daily. 30 tablet 12  . atorvastatin (LIPITOR) 40 MG tablet Take 40 mg by mouth daily.    . Cholecalciferol (VITAMIN D3 PO) Take 1 tablet by mouth daily.    . metoprolol succinate (TOPROL-XL) 25 MG 24 hr tablet Take 12.5 mg by mouth daily.    Marland Kitchen warfarin (COUMADIN) 4 MG tablet TAKE AS DIRECTED BY COUMADIN CLINIC 45 tablet 3  . zolpidem (AMBIEN) 10 MG tablet Take 5 mg by mouth at bedtime as needed for sleep.     No facility-administered medications prior to visit.      Allergies:   Review of patient's allergies indicates no known allergies.   Social History   Social History  . Marital status: Single    Spouse name: N/A  . Number of children: N/A  . Years of education: N/A   Social History Main Topics  . Smoking status: Former Smoker    Quit date: 02/02/1984  . Smokeless tobacco: Never Used  . Alcohol use None  . Drug use: Unknown  . Sexual activity: No   Other Topics Concern  . None   Social History Narrative  . None     Family History:  The patient's family history includes Breast cancer in her sister; Diabetes in her mother; Liver cancer in her mother; Prostate cancer in her brother.   ROS:  Please see the history of present illness.    Recent diagnosis of breast cancer. Recent chills. Otherwise no complaints.  All other systems reviewed and are negative.   PHYSICAL EXAM:   VS:  BP (!) 150/72   Pulse (!) 50   Ht 5\' 8"  (1.727 m)   Wt 142 lb 12.8 oz (64.8 kg)   BMI 21.71 kg/m    GEN: Well nourished, well developed, in no acute distress  HEENT: normal  Neck: no JVD, carotid bruits, or masses Cardiac: RRR; no murmurs, rubs, or gallops,no edema  Respiratory:  clear to auscultation bilaterally, normal work of breathing GI: soft, nontender, nondistended, + BS MS: no deformity or atrophy  Skin: warm and dry, no rash Neuro:  Alert and Oriented x 3, Strength and sensation are intact Psych: euthymic mood, full affect  Wt Readings from Last 3  Encounters:  11/04/15 142 lb 12.8 oz (64.8 kg)  08/12/15 141 lb 8 oz (64.2 kg)  05/16/15 144 lb 1.6 oz (65.4 kg)      Studies/Labs Reviewed:   EKG:  EKG  Sinus bradycardia and febrile were with prominent voltage. Otherwise unremarkable.  Recent Labs: 05/16/2015: ALT 12; BUN 13.2; Creatinine 1.1; HGB 13.2; Platelets 190; Potassium 4.1; Sodium 141   Lipid Panel No results found for: CHOL, TRIG, HDL, CHOLHDL, VLDL, LDLCALC, LDLDIRECT  Additional studies/ records that were reviewed today include:  No new data    ASSESSMENT:    1. Paroxysmal atrial flutter ([place])   2. Tachy-brady syndrome ([place])   3. Essential hypertension   4. Chronic anticoagulation      PLAN:  In order of problems listed above:  1. No atrial flutter or atrial fibrillation documented recently. 2. Staple diagnosis without excessive bradycardia 3. 132/68 mmHg. Excellent control on the current regimen of low-dose amlodipine and metoprolol. 4. No bleeding on Coumadin. Continue follow-up as directed by the Coumadin clinic.    Medication Adjustments/Labs and Tests Ordered: Current medicines are reviewed at length with the patient today.  Concerns regarding medicines are outlined above.  Medication changes, Labs and Tests ordered today are listed in the Patient Instructions below. There are no Patient Instructions on file for this visit.   Signed, Sinclair Grooms, MD  11/04/2015 9:10 AM    [facility] [place], [place],   [address] Phone: [phone]; Fax: [phone]

## 2015-11-04 NOTE — Patient Instructions (Signed)

## 2015-12-04 DIAGNOSIS — C50911 Malignant neoplasm of unspecified site of right female breast: Secondary | ICD-10-CM | POA: Diagnosis not present

## 2015-12-16 ENCOUNTER — Ambulatory Visit (INDEPENDENT_AMBULATORY_CARE_PROVIDER_SITE_OTHER): Payer: Medicare Other | Admitting: Pharmacist

## 2015-12-16 DIAGNOSIS — I4891 Unspecified atrial fibrillation: Secondary | ICD-10-CM | POA: Diagnosis not present

## 2015-12-16 LAB — POCT INR: INR: 2.3

## 2015-12-31 ENCOUNTER — Other Ambulatory Visit: Payer: Self-pay | Admitting: Physician Assistant

## 2016-01-20 ENCOUNTER — Ambulatory Visit
Admission: RE | Admit: 2016-01-20 | Discharge: 2016-01-20 | Disposition: A | Discharge status: Home / Self Care | Payer: Medicare Other | Source: Ambulatory Visit | Attending: Oncology | Admitting: Oncology

## 2016-01-20 DIAGNOSIS — C50211 Malignant neoplasm of upper-inner quadrant of right female breast: Secondary | ICD-10-CM

## 2016-01-20 DIAGNOSIS — Z780 Asymptomatic menopausal state: Secondary | ICD-10-CM | POA: Diagnosis not present

## 2016-01-20 DIAGNOSIS — M8589 Other specified disorders of bone density and structure, multiple sites: Secondary | ICD-10-CM | POA: Diagnosis not present

## 2016-01-20 DIAGNOSIS — M8580 Other specified disorders of bone density and structure, unspecified site: Secondary | ICD-10-CM

## 2016-01-20 DIAGNOSIS — R928 Other abnormal and inconclusive findings on diagnostic imaging of breast: Secondary | ICD-10-CM | POA: Diagnosis not present

## 2016-01-29 ENCOUNTER — Ambulatory Visit (INDEPENDENT_AMBULATORY_CARE_PROVIDER_SITE_OTHER): Payer: Medicare Other

## 2016-01-29 DIAGNOSIS — I4891 Unspecified atrial fibrillation: Secondary | ICD-10-CM

## 2016-01-29 LAB — POCT INR: INR: 2.5

## 2016-03-01 ENCOUNTER — Other Ambulatory Visit: Payer: Self-pay | Admitting: Interventional Cardiology

## 2016-03-11 ENCOUNTER — Ambulatory Visit (INDEPENDENT_AMBULATORY_CARE_PROVIDER_SITE_OTHER): Payer: Medicare Other | Admitting: *Deleted

## 2016-03-11 DIAGNOSIS — I4891 Unspecified atrial fibrillation: Secondary | ICD-10-CM

## 2016-03-11 LAB — POCT INR: INR: 3.3

## 2016-04-05 ENCOUNTER — Other Ambulatory Visit: Payer: Self-pay | Admitting: Adult Health

## 2016-04-05 DIAGNOSIS — C50211 Malignant neoplasm of upper-inner quadrant of right female breast: Secondary | ICD-10-CM

## 2016-04-05 DIAGNOSIS — Z170 Estrogen receptor positive status [ER+]: Principal | ICD-10-CM

## 2016-04-06 ENCOUNTER — Other Ambulatory Visit (HOSPITAL_BASED_OUTPATIENT_CLINIC_OR_DEPARTMENT_OTHER): Payer: Medicare Other

## 2016-04-06 DIAGNOSIS — Z170 Estrogen receptor positive status [ER+]: Principal | ICD-10-CM

## 2016-04-06 DIAGNOSIS — C50211 Malignant neoplasm of upper-inner quadrant of right female breast: Secondary | ICD-10-CM

## 2016-04-06 LAB — COMPREHENSIVE METABOLIC PANEL
ALT: 10 U/L (ref 0–55)
AST: 18 U/L (ref 5–34)
Albumin: 4.1 g/dL (ref 3.5–5.0)
Alkaline Phosphatase: 56 U/L (ref 40–150)
Anion Gap: 9 mEq/L (ref 3–11)
BUN: 15.2 mg/dL (ref 7.0–26.0)
CHLORIDE: 108 meq/L (ref 98–109)
CO2: 25 meq/L (ref 22–29)
CREATININE: 1 mg/dL (ref 0.6–1.1)
Calcium: 9.9 mg/dL (ref 8.4–10.4)
EGFR: 61 mL/min/{1.73_m2} — ABNORMAL LOW (ref >90)
Glucose: 88 mg/dl (ref 70–140)
POTASSIUM: 3.9 meq/L (ref 3.5–5.1)
Sodium: 142 mEq/L (ref 136–145)
Total Bilirubin: 0.72 mg/dL (ref 0.20–1.20)
Total Protein: 7.3 g/dL (ref 6.4–8.3)

## 2016-04-06 LAB — CBC WITH DIFFERENTIAL/PLATELET
BASO%: 0.4 % (ref 0.0–2.0)
Basophils Absolute: 0 10*3/uL (ref 0.0–0.1)
EOS%: 2.1 % (ref 0.0–7.0)
Eosinophils Absolute: 0.1 10*3/uL (ref 0.0–0.5)
HCT: 40 % (ref 34.8–46.6)
HGB: 13 g/dL (ref 11.6–15.9)
LYMPH%: 22.4 % (ref 14.0–49.7)
MCH: 28.1 pg (ref 25.1–34.0)
MCHC: 32.6 g/dL (ref 31.5–36.0)
MCV: 86.1 fL (ref 79.5–101.0)
MONO#: 0.4 10*3/uL (ref 0.1–0.9)
MONO%: 7 % (ref 0.0–14.0)
NEUT#: 4 10*3/uL (ref 1.5–6.5)
NEUT%: 68.1 % (ref 38.4–76.8)
Platelets: 203 10*3/uL (ref 145–400)
RBC: 4.64 10*6/uL (ref 3.70–5.45)
RDW: 16 % — ABNORMAL HIGH (ref 11.2–14.5)
WBC: 5.9 10*3/uL (ref 3.9–10.3)
lymph#: 1.3 10*3/uL (ref 0.9–3.3)

## 2016-04-08 ENCOUNTER — Ambulatory Visit (INDEPENDENT_AMBULATORY_CARE_PROVIDER_SITE_OTHER): Payer: Medicare Other | Admitting: *Deleted

## 2016-04-08 DIAGNOSIS — I4891 Unspecified atrial fibrillation: Secondary | ICD-10-CM

## 2016-04-08 LAB — POCT INR: INR: 3.4

## 2016-04-12 ENCOUNTER — Telehealth: Payer: Self-pay | Admitting: Oncology

## 2016-04-12 NOTE — Telephone Encounter (Signed)
Patient called in to r/s appt from 3/13 to 3/20 due to weather. Patient is aware of new time and date.

## 2016-04-13 ENCOUNTER — Ambulatory Visit: Payer: Medicare Other | Admitting: Oncology

## 2016-04-20 ENCOUNTER — Ambulatory Visit (HOSPITAL_BASED_OUTPATIENT_CLINIC_OR_DEPARTMENT_OTHER): Payer: Medicare Other | Admitting: Oncology

## 2016-04-20 VITALS — BP 157/67 | HR 63 | Temp 97.7°F | Resp 18 | Ht 68.0 in | Wt 137.1 lb

## 2016-04-20 DIAGNOSIS — G4700 Insomnia, unspecified: Secondary | ICD-10-CM

## 2016-04-20 DIAGNOSIS — Z170 Estrogen receptor positive status [ER+]: Secondary | ICD-10-CM | POA: Diagnosis not present

## 2016-04-20 DIAGNOSIS — C50211 Malignant neoplasm of upper-inner quadrant of right female breast: Secondary | ICD-10-CM

## 2016-04-20 DIAGNOSIS — M8580 Other specified disorders of bone density and structure, unspecified site: Secondary | ICD-10-CM | POA: Diagnosis not present

## 2016-04-20 NOTE — Progress Notes (Signed)
[facility]  [phone] [phone]     ID: Joleena Weisenburger Bunning DOB: [birth year]  MR#: [id number]  MBT#:[id number]  Patient Care Team: [name], MD as PCP - General (Family Medicine) Excell Seltzer, MD as Consulting Physician (General Surgery) Chauncey Cruel, MD as Consulting Physician (Oncology) Eppie Gibson, MD as Attending Physician (Radiation Oncology) PCP: Lilian Coma, MD Chauncey Cruel, MD GYN: SU:  OTHER MD:  CHIEF COMPLAINT: Estrogen receptor positive breast cancer  CURRENT TREATMENT: Anastrozole   BREAST CANCER HISTORY: From the original intake note:  The patient had routine screening mammography at [facility] 01/20/2015 showing a possible mass in the right breast. On 02/19/2015 she returned for right diagnostic mammography with tomosynthesis and right breast ultrasonography. The breast density was category B. In the upper inner quadrant of the right breast there was a 1.3 cm irregular mass which was palpable by exam. Ultrasonography measured this at 1.3 cm maximally. Ultrasound of the right axilla was unremarkable.  Biopsy of this mass 02/27/2015 showed ([id number]) an invasive ductal carcinoma, grade 1 or 2, 100% estrogen receptor positive, with strong staining intensity; 90% progesterone receptor positive with moderate staining intensity, with an MIB-1 of 10%, and no HER-2 implication, the signals ratio 1.15 and the number per cell 1.95.  The case was discussed at breast cancer conference and it was decided sentinel lymph node sampling and Oncotype would not be needed. The patient proceeded to right lumpectomy 04/07/2015, with the final pathology ([id number]) showing an invasive ductal carcinoma, grade 1, measuring 1.3 cm. Margins were negative. Repeat HER-2 was again negative, with a signals ratio of 1.43, the number per cell being 2.00.  The patient met with radiation oncology 05/16/2015. At that time the possibility  of adjuvant radiation versus anti-estrogens only was discussed. The patient was supposed to have seen me after that visit but she did not show. She subsequently called and said that she would like "pill" rather than radiation and was started on anastrozole April 2017.  Her subsequent history is as detailed below  INTERVAL HISTORY: Shaneece returns today for follow-up of her estrogen receptor positive breast cancer. She continues on anastrozole, with good tolerance.  Hot flashes and vaginal dryness are not a major issue. She never developed the arthralgias or myalgias that many patients can experience on this medication. She obtains it at a good price.  REVIEW OF SYSTEMS:  she has a birthday coming up which she is looking forward to. She tells me her appetite remains poor. She has lost approximately 4 pounds in the last several months she also has problems with insomnia. She has problems with lactose intolerance. Bowel movements are normal however. [facility] and walks at least a mile at the time with a friendly group A detailed review of systems today was otherwise stable    PAST MEDICAL HISTORY: Past Medical History:  Diagnosis Date  . Anticoagulated   . Anxiety   . Arthritis of hand   . Atrial flutter ([place])    with LVEF 60%, LVH, LAE and holter with rate control and Ave HR 60 bpm   . Carotid artery plaque    mild bilateral carotid plaque without significant stenosis (<20% in left ICA), 04/2010  . CKD (chronic kidney disease) stage 2, GFR 60-89 ml/min   . Diverticula, colon   . Hypercholesterolemia   . Hypertension    with LVH on echo 06/03/11  . Hypertonicity of bladder   .  Insomnia   . Mitral regurgitation   . Osteopenia    > osteoporosis  . Overactive bladder     PAST SURGICAL HISTORY: Past Surgical History:  Procedure Laterality Date  . BREAST LUMPECTOMY WITH RADIOACTIVE SEED LOCALIZATION Right 04/07/2015   Procedure: RIGHT BREAST LUMPECTOMY WITH  RADIOACTIVE SEED LOCALIZATION;  Surgeon: Excell Seltzer, MD;  Location: [facility];  Service: General;  Laterality: Right;  . COLONOSCOPY  2001   2007 sigmoidoscopy     FAMILY HISTORY Family History  Problem Relation Age of Onset  . Diabetes Mother   . Liver cancer Mother   . Breast cancer Sister   . Prostate cancer Brother   . Heart attack Neg Hx   . Stroke Neg Hx   Patient's father died in his 27s from causes unknown to the patient. The patient's mother died at the age of 69 with what may have been breast cancer. The patient had 2 brothers one sister. One sister was diagnosed with breast cancer in her early 82s.  GYNECOLOGIC HISTORY:  No LMP recorded. Patient is postmenopausal. Menarche age 44, first live birth age 70, the patient is [place] P3. She does not recall when she went through menopause. She never took hormone replacement or oral contraceptives  SOCIAL HISTORY:  The patient worked at [employer] for many years. She is now retired. She is widowed, lives by herself, with no pets. Her daughter [name] is an Optometrist for the [employer] system. Son [name] lives in [place] and is disabled. Son [name] lives in [place] and works for [employer]. The patient has 4 grandchildren and 2 great-grandchildren.    ADVANCED DIRECTIVES: The patient tells me her daughter Olin Hauser is her healthcare part of attorney. She can be reached at [place]: Social History  Substance Use Topics  . Smoking status: Former Smoker    Quit date: 02/02/1984  . Smokeless tobacco: Never Used  . Alcohol use Not on file     Colonoscopy:  PAP:  Bone density:   No Known Allergies  Current Outpatient Prescriptions  Medication Sig Dispense Refill  . acetaminophen (TYLENOL) 325 MG tablet Take 650 mg by mouth every 6 (six) hours as needed for mild pain.    Marland Kitchen alendronate (FOSAMAX) 70 MG tablet Take 70 mg by mouth once a week. Take with a full  glass of water on an empty stomach.    Marland Kitchen amLODipine (NORVASC) 2.5 MG tablet TAKE 1 TABLET BY MOUTH DAILY. 30 tablet 10  . anastrozole (ARIMIDEX) 1 MG tablet Take 1 tablet (1 mg total) by mouth daily. 30 tablet 12  . atorvastatin (LIPITOR) 40 MG tablet Take 40 mg by mouth daily.    . Cholecalciferol (VITAMIN D3 PO) Take 1 tablet by mouth daily.    . metoprolol succinate (TOPROL-XL) 25 MG 24 hr tablet Take 12.5 mg by mouth daily.    Marland Kitchen warfarin (COUMADIN) 4 MG tablet TAKE AS DIRECTED BY COUMADIN CLINIC 45 tablet 3  . zolpidem (AMBIEN) 10 MG tablet Take 5 mg by mouth at bedtime as needed for sleep.     No current facility-administered medications for this visit.     OBJECTIVE: Older African-American woman In no acute distress   Vitals:   04/20/16 1339  BP: (!) 157/67  Pulse: 63  Resp: 18  Temp: 97.7 F (36.5 C)     Body mass index is 20.85 kg/m.    ECOG FS:0 - Asymptomatic  Sclerae unicteric,  pupils round and equal Oropharynx clear and moist-- no thrush or other lesions No cervical or supraclavicular adenopathy Lungs no rales or rhonchi Heart regular rate and rhythm Abd soft, nontender, positive bowel sounds MSK no focal spinal tenderness, no upper extremity lymphedema Neuro: nonfocal, well oriented, appropriate affect Breasts: The right breast has undergone lumpectomy with no evidence of local recurrence. The left breast is benign. Both axillae are benign.  LAB RESULTS:  CMP     Component Value Date/Time   NA 142 04/06/2016 1131   K 3.9 04/06/2016 1131   CO2 25 04/06/2016 1131   GLUCOSE 88 04/06/2016 1131   BUN 15.2 04/06/2016 1131   CREATININE 1.0 04/06/2016 1131   CALCIUM 9.9 04/06/2016 1131   PROT 7.3 04/06/2016 1131   ALBUMIN 4.1 04/06/2016 1131   AST 18 04/06/2016 1131   ALT 10 04/06/2016 1131   ALKPHOS 56 04/06/2016 1131   BILITOT 0.72 04/06/2016 1131    INo results found for: SPEP, UPEP  Lab Results  Component Value Date   WBC 5.9 04/06/2016   NEUTROABS  4.0 04/06/2016   HGB 13.0 04/06/2016   HCT 40.0 04/06/2016   MCV 86.1 04/06/2016   PLT 203 04/06/2016      Chemistry      Component Value Date/Time   NA 142 04/06/2016 1131   K 3.9 04/06/2016 1131   CO2 25 04/06/2016 1131   BUN 15.2 04/06/2016 1131   CREATININE 1.0 04/06/2016 1131      Component Value Date/Time   CALCIUM 9.9 04/06/2016 1131   ALKPHOS 56 04/06/2016 1131   AST 18 04/06/2016 1131   ALT 10 04/06/2016 1131   BILITOT 0.72 04/06/2016 1131       No results found for: LABCA2  No components found for: LABCA125  No results for input(s): INR in the last 168 hours.  Urinalysis No results found for: COLORURINE, APPEARANCEUR, LABSPEC, PHURINE, GLUCOSEU, HGBUR, BILIRUBINUR, KETONESUR, PROTEINUR, UROBILINOGEN, NITRITE, LEUKOCYTESUR   STUDIES: No results found.  ELIGIBLE FOR AVAILABLE RESEARCH PROTOCOL: no  ASSESSMENT: 83 y.o. [place] woman status post right breast upper inner quadrant lumpectomy 04/07/2015 for a pT1c cN0, stage IA invasive ductal carcinoma, grade 1, estrogen and progesterone receptor positive, HER-2 nonamplified, with an MIB-1 of 10%.  (1) the patient opted against adjuvant radiation   (2) started anastrozole April 2017   (a) Bone density December 2017 showed a T score of -2.4  (3) may qualify for genetics counseling   PLAN: Haylei is now a year out from definitive surgery for her estrogen receptor positive breast cancer, with no evidence of disease recurrence. This is very favorable.  She is tolerating anastrozole well. The plan is to continue that for 5 years total.  The one concern is her bone density, which shows near osteoporosis. I think she would be a good candidate for denosumab/Xgeva, and today we discussed the possible toxicities, side effects and complications of that agent, including the very rare instances of osteonecrosis of the jaw. Syvanna tells me her teeth are under good repair and no extractions or implants are  planned  Accordingly she will be scheduled for denosumab/Xgeva 05/06/2016. She will have her second dose 6 months later. She will see me a year from now, or little short of that, to coincide with her third denosumab treatment. We will repeat a bone density 2 years from the last one, which will be December 2019  She knows to call for any problems that may develop before her next visit  here.   Chauncey Cruel, MD   04/20/2016 1:51 PM [facility] [address] [place], [place] [address] Tel. [phone]    Fax. [phone]

## 2016-04-23 ENCOUNTER — Ambulatory Visit (INDEPENDENT_AMBULATORY_CARE_PROVIDER_SITE_OTHER): Payer: Medicare Other

## 2016-04-23 DIAGNOSIS — Z0000 Encounter for general adult medical examination without abnormal findings: Secondary | ICD-10-CM | POA: Diagnosis not present

## 2016-04-23 DIAGNOSIS — G479 Sleep disorder, unspecified: Secondary | ICD-10-CM | POA: Diagnosis not present

## 2016-04-23 DIAGNOSIS — I059 Rheumatic mitral valve disease, unspecified: Secondary | ICD-10-CM | POA: Diagnosis not present

## 2016-04-23 DIAGNOSIS — I4891 Unspecified atrial fibrillation: Secondary | ICD-10-CM | POA: Diagnosis not present

## 2016-04-23 DIAGNOSIS — C50919 Malignant neoplasm of unspecified site of unspecified female breast: Secondary | ICD-10-CM | POA: Diagnosis not present

## 2016-04-23 DIAGNOSIS — Z79899 Other long term (current) drug therapy: Secondary | ICD-10-CM | POA: Diagnosis not present

## 2016-04-23 DIAGNOSIS — Z131 Encounter for screening for diabetes mellitus: Secondary | ICD-10-CM | POA: Diagnosis not present

## 2016-04-23 DIAGNOSIS — I779 Disorder of arteries and arterioles, unspecified: Secondary | ICD-10-CM | POA: Diagnosis not present

## 2016-04-23 DIAGNOSIS — E785 Hyperlipidemia, unspecified: Secondary | ICD-10-CM | POA: Diagnosis not present

## 2016-04-23 DIAGNOSIS — Z23 Encounter for immunization: Secondary | ICD-10-CM | POA: Diagnosis not present

## 2016-04-23 DIAGNOSIS — M810 Age-related osteoporosis without current pathological fracture: Secondary | ICD-10-CM | POA: Diagnosis not present

## 2016-04-23 DIAGNOSIS — I10 Essential (primary) hypertension: Secondary | ICD-10-CM | POA: Diagnosis not present

## 2016-04-23 DIAGNOSIS — I272 Pulmonary hypertension, unspecified: Secondary | ICD-10-CM | POA: Diagnosis not present

## 2016-04-23 LAB — POCT INR: INR: 2.3

## 2016-04-29 ENCOUNTER — Other Ambulatory Visit: Payer: Self-pay | Admitting: Oncology

## 2016-05-06 ENCOUNTER — Other Ambulatory Visit: Payer: Self-pay | Admitting: Oncology

## 2016-05-06 ENCOUNTER — Ambulatory Visit (HOSPITAL_BASED_OUTPATIENT_CLINIC_OR_DEPARTMENT_OTHER): Payer: Medicare Other

## 2016-05-06 VITALS — BP 164/74 | HR 54 | Temp 97.8°F | Resp 18

## 2016-05-06 DIAGNOSIS — M8580 Other specified disorders of bone density and structure, unspecified site: Secondary | ICD-10-CM | POA: Diagnosis present

## 2016-05-06 DIAGNOSIS — C50211 Malignant neoplasm of upper-inner quadrant of right female breast: Secondary | ICD-10-CM

## 2016-05-06 DIAGNOSIS — Z170 Estrogen receptor positive status [ER+]: Secondary | ICD-10-CM

## 2016-05-06 MED ORDER — DENOSUMAB 60 MG/ML ~~LOC~~ SOLN
60.0000 mg | Freq: Once | SUBCUTANEOUS | Status: AC
  Administered 2016-05-06: 60 mg via SUBCUTANEOUS
  Filled 2016-05-06: qty 1

## 2016-05-06 NOTE — Progress Notes (Signed)
Patient told to discontinue Fosamax 70 mg now that she is beginning Prolia injections today.  Patient verbalizes understanding.

## 2016-05-06 NOTE — Patient Instructions (Signed)

## 2016-05-14 ENCOUNTER — Ambulatory Visit (INDEPENDENT_AMBULATORY_CARE_PROVIDER_SITE_OTHER): Payer: Medicare Other | Admitting: *Deleted

## 2016-05-14 DIAGNOSIS — I4891 Unspecified atrial fibrillation: Secondary | ICD-10-CM | POA: Diagnosis not present

## 2016-05-14 LAB — POCT INR: INR: 2.5

## 2016-06-08 ENCOUNTER — Other Ambulatory Visit: Payer: Self-pay

## 2016-06-08 MED ORDER — ANASTROZOLE 1 MG PO TABS: 1.0000 mg | ORAL_TABLET | Freq: Every day | ORAL | 12 refills | Status: DC

## 2016-06-09 ENCOUNTER — Other Ambulatory Visit: Payer: Self-pay

## 2016-06-10 DIAGNOSIS — C50911 Malignant neoplasm of unspecified site of right female breast: Secondary | ICD-10-CM | POA: Diagnosis not present

## 2016-06-11 ENCOUNTER — Ambulatory Visit (INDEPENDENT_AMBULATORY_CARE_PROVIDER_SITE_OTHER): Payer: Medicare Other

## 2016-06-11 DIAGNOSIS — I4891 Unspecified atrial fibrillation: Secondary | ICD-10-CM | POA: Diagnosis not present

## 2016-06-11 LAB — POCT INR: INR: 3.4

## 2016-06-14 ENCOUNTER — Other Ambulatory Visit: Payer: Self-pay

## 2016-06-14 MED ORDER — ANASTROZOLE 1 MG PO TABS: 1.0000 mg | ORAL_TABLET | Freq: Every day | ORAL | 12 refills | Status: DC

## 2016-06-23 ENCOUNTER — Other Ambulatory Visit: Payer: Self-pay | Admitting: Interventional Cardiology

## 2016-07-02 ENCOUNTER — Ambulatory Visit (INDEPENDENT_AMBULATORY_CARE_PROVIDER_SITE_OTHER): Payer: Medicare Other

## 2016-07-02 DIAGNOSIS — I4891 Unspecified atrial fibrillation: Secondary | ICD-10-CM | POA: Diagnosis not present

## 2016-07-02 LAB — POCT INR: INR: 2.7

## 2016-07-16 DIAGNOSIS — H349 Unspecified retinal vascular occlusion: Secondary | ICD-10-CM | POA: Diagnosis not present

## 2016-07-16 DIAGNOSIS — Z961 Presence of intraocular lens: Secondary | ICD-10-CM | POA: Diagnosis not present

## 2016-07-16 DIAGNOSIS — H524 Presbyopia: Secondary | ICD-10-CM | POA: Diagnosis not present

## 2016-08-02 ENCOUNTER — Ambulatory Visit (INDEPENDENT_AMBULATORY_CARE_PROVIDER_SITE_OTHER): Payer: Medicare Other | Admitting: *Deleted

## 2016-08-02 DIAGNOSIS — I4891 Unspecified atrial fibrillation: Secondary | ICD-10-CM

## 2016-08-02 LAB — POCT INR: INR: 2.6

## 2016-08-30 ENCOUNTER — Ambulatory Visit (INDEPENDENT_AMBULATORY_CARE_PROVIDER_SITE_OTHER): Payer: Medicare Other | Admitting: *Deleted

## 2016-08-30 DIAGNOSIS — I4891 Unspecified atrial fibrillation: Secondary | ICD-10-CM | POA: Diagnosis not present

## 2016-08-30 LAB — POCT INR: INR: 2.3

## 2016-10-11 ENCOUNTER — Ambulatory Visit (INDEPENDENT_AMBULATORY_CARE_PROVIDER_SITE_OTHER): Payer: Medicare Other | Admitting: Pharmacist

## 2016-10-11 DIAGNOSIS — I4891 Unspecified atrial fibrillation: Secondary | ICD-10-CM | POA: Diagnosis not present

## 2016-10-11 LAB — POCT INR: INR: 2.5

## 2016-10-21 ENCOUNTER — Ambulatory Visit (HOSPITAL_BASED_OUTPATIENT_CLINIC_OR_DEPARTMENT_OTHER): Payer: Medicare Other

## 2016-10-21 ENCOUNTER — Other Ambulatory Visit (HOSPITAL_BASED_OUTPATIENT_CLINIC_OR_DEPARTMENT_OTHER): Payer: Medicare Other

## 2016-10-21 VITALS — BP 163/71 | HR 54 | Temp 98.0°F | Resp 18

## 2016-10-21 DIAGNOSIS — C50211 Malignant neoplasm of upper-inner quadrant of right female breast: Secondary | ICD-10-CM

## 2016-10-21 DIAGNOSIS — Z170 Estrogen receptor positive status [ER+]: Secondary | ICD-10-CM

## 2016-10-21 DIAGNOSIS — M8580 Other specified disorders of bone density and structure, unspecified site: Secondary | ICD-10-CM

## 2016-10-21 LAB — COMPREHENSIVE METABOLIC PANEL
ALT: 10 U/L (ref 0–55)
AST: 19 U/L (ref 5–34)
Albumin: 4.1 g/dL (ref 3.5–5.0)
Alkaline Phosphatase: 49 U/L (ref 40–150)
Anion Gap: 9 mEq/L (ref 3–11)
BUN: 11.5 mg/dL (ref 7.0–26.0)
CALCIUM: 9.9 mg/dL (ref 8.4–10.4)
CHLORIDE: 108 meq/L (ref 98–109)
CO2: 25 mEq/L (ref 22–29)
Creatinine: 0.9 mg/dL (ref 0.6–1.1)
EGFR: 71 mL/min/{1.73_m2} — ABNORMAL LOW (ref >90)
Glucose: 85 mg/dl (ref 70–140)
POTASSIUM: 4.2 meq/L (ref 3.5–5.1)
Sodium: 142 mEq/L (ref 136–145)
Total Bilirubin: 0.76 mg/dL (ref 0.20–1.20)
Total Protein: 7.2 g/dL (ref 6.4–8.3)

## 2016-10-21 LAB — CBC WITH DIFFERENTIAL/PLATELET
BASO%: 0.6 % (ref 0.0–2.0)
Basophils Absolute: 0 10*3/uL (ref 0.0–0.1)
EOS%: 1.9 % (ref 0.0–7.0)
Eosinophils Absolute: 0.1 10*3/uL (ref 0.0–0.5)
HEMATOCRIT: 39.6 % (ref 34.8–46.6)
HGB: 13.1 g/dL (ref 11.6–15.9)
LYMPH%: 26.1 % (ref 14.0–49.7)
MCH: 28.5 pg (ref 25.1–34.0)
MCHC: 33.1 g/dL (ref 31.5–36.0)
MCV: 86 fL (ref 79.5–101.0)
MONO#: 0.5 10*3/uL (ref 0.1–0.9)
MONO%: 7.8 % (ref 0.0–14.0)
NEUT#: 3.8 10*3/uL (ref 1.5–6.5)
NEUT%: 63.6 % (ref 38.4–76.8)
Platelets: 203 10*3/uL (ref 145–400)
RBC: 4.6 10*6/uL (ref 3.70–5.45)
RDW: 15.9 % — AB (ref 11.2–14.5)
WBC: 6 10*3/uL (ref 3.9–10.3)
lymph#: 1.6 10*3/uL (ref 0.9–3.3)

## 2016-10-21 MED ORDER — DENOSUMAB 60 MG/ML ~~LOC~~ SOLN
60.0000 mg | Freq: Once | SUBCUTANEOUS | Status: AC
  Administered 2016-10-21: 60 mg via SUBCUTANEOUS
  Filled 2016-10-21: qty 1

## 2016-11-02 NOTE — Progress Notes (Signed)
Cardiology Office Note    Date:  11/03/2016   ID:  EVANNA WASHINTON, DOB 11/06/1932, MRN [id number]  PCP:  [name], MD  Cardiologist: Sinclair Grooms, MD   Chief Complaint  Patient presents with  . Atrial Fibrillation    History of Present Illness:  Martha Edwards is a 83 y.o. female with the tachybradycardia syndrome, PAF, hypertension, and chronic anticoagulation therapy.   Not as energetic as previous. Still quite active. He enjoys activities that include her grandchildren and great-grandchildren. She goes to [employer] football games because one of her grandkids is a flag girl. She denies exertional dyspnea and chest pain. No episodes of syncope. No orthopnea, PND, lower extremity swelling. She has not had swelling.  Past Medical History:  Diagnosis Date  . Anticoagulated   . Anxiety   . Arthritis of hand   . Atrial flutter ([place])    with LVEF 60%, LVH, LAE and holter with rate control and Ave HR 60 bpm   . Carotid artery plaque    mild bilateral carotid plaque without significant stenosis (<20% in left ICA), 04/2010  . CKD (chronic kidney disease) stage 2, GFR 60-89 ml/min   . Diverticula, colon   . Hypercholesterolemia   . Hypertension    with LVH on echo 06/03/11  . Hypertonicity of bladder   . Insomnia   . Mitral regurgitation   . Osteopenia    > osteoporosis  . Overactive bladder     Past Surgical History:  Procedure Laterality Date  . BREAST LUMPECTOMY WITH RADIOACTIVE SEED LOCALIZATION Right 04/07/2015   Procedure: RIGHT BREAST LUMPECTOMY WITH RADIOACTIVE SEED LOCALIZATION;  Surgeon: Excell Seltzer, MD;  Location: [facility];  Service: General;  Laterality: Right;  . COLONOSCOPY  2001   2007 sigmoidoscopy     Current Medications: Outpatient Medications Prior to Visit  Medication Sig Dispense Refill  . acetaminophen (TYLENOL) 325 MG tablet Take 650 mg by mouth every 6 (six) hours as needed for mild pain.    Marland Kitchen amLODipine  (NORVASC) 2.5 MG tablet TAKE 1 TABLET BY MOUTH DAILY. 30 tablet 10  . anastrozole (ARIMIDEX) 1 MG tablet Take 1 tablet (1 mg total) by mouth daily. 30 tablet 12  . Cholecalciferol (VITAMIN D3 PO) Take 1 tablet by mouth daily.    . metoprolol succinate (TOPROL-XL) 25 MG 24 hr tablet Take 12.5 mg by mouth daily.    Marland Kitchen warfarin (COUMADIN) 4 MG tablet TAKE AS DIRECTED BY COUMADIN CLINIC 45 tablet 3  . zolpidem (AMBIEN) 10 MG tablet Take 5 mg by mouth at bedtime as needed for sleep.     No facility-administered medications prior to visit.      Allergies:   Patient has no known allergies.   Social History   Social History  . Marital status: Single    Spouse name: N/A  . Number of children: N/A  . Years of education: N/A   Social History Main Topics  . Smoking status: Former Smoker    Quit date: 02/02/1984  . Smokeless tobacco: Never Used  . Alcohol use None  . Drug use: Unknown  . Sexual activity: No   Other Topics Concern  . None   Social History Narrative  . None     Family History:  The patient's family history includes Breast cancer in her sister; Diabetes in her mother; Liver cancer in her mother; Prostate cancer in her brother.   ROS:   Please see the  history of present illness.    Occasional back discomfort. Otherwise no complaints.  All other systems reviewed and are negative.   PHYSICAL EXAM:   VS:  BP (!) 146/70 (BP Location: Left Arm)   Pulse (!) 54   Ht 5' 7.5" (1.715 m)   Wt 139 lb (63 kg)   BMI 21.45 kg/m    GEN: Well nourished, well developed, in no acute distress  HEENT: normal  Neck: no JVD, carotid bruits, or masses Cardiac: RRR; no murmurs, rubs, or edema.  Respiratory:  clear to auscultation bilaterally, normal work of breathing GI: soft, nontender, nondistended, + BS MS: no deformity or atrophy  Skin: warm and dry, no rash Neuro:  Alert and Oriented x 3, Strength and sensation are intact Psych: euthymic mood, full affect  Wt Readings from Last  3 Encounters:  11/03/16 139 lb (63 kg)  04/20/16 137 lb 1.6 oz (62.2 kg)  11/04/15 142 lb 12.8 oz (64.8 kg)      Studies/Labs Reviewed:   EKG:  EKG  Sinus rhythm, left atrial abnormality, first-degree AV block, and when compared to the prior tracing, no significant change has occurred.  Recent Labs: 10/21/2016: ALT 10; BUN 11.5; Creatinine 0.9; HGB 13.1; Platelets 203; Potassium 4.2; Sodium 142   Lipid Panel No results found for: CHOL, TRIG, HDL, CHOLHDL, VLDL, LDLCALC, LDLDIRECT  Additional studies/ records that were reviewed today include:  No new data    ASSESSMENT:    1. Paroxysmal atrial flutter ([place])   2. Essential hypertension   3. Tachy-brady syndrome ([place])   4. Chronic anticoagulation      PLAN:  In order of problems listed above:  1. Currently stable in sinus bradycardia and asymptomatic. She is on ultra low dose. Continue to monitor heart rate and conduction intervals. 2. Adequate blood pressure control for age. 3. No indication for pacemaker therapy at the current time. 4. Continue therapy with warfarin to prevent embolic stroke. CHADS VASC >2  1 year follow-up. Monitor for bleeding. Continue low-dose beta blocker therapy.  Medication Adjustments/Labs and Tests Ordered: Current medicines are reviewed at length with the patient today.  Concerns regarding medicines are outlined above.  Medication changes, Labs and Tests ordered today are listed in the Patient Instructions below. Patient Instructions  Medication Instructions:  Your physician recommends that you continue on your current medications as directed. Please refer to the Current Medication list given to you today.   Labwork: None   Testing/Procedures: None   Follow-Up: Your physician wants you to follow-up in 1 year with Dr. Tamala Julian. You will receive a reminder letter in the mail two months in advance. If you don't receive a letter, please call our office to schedule the follow-up  appointment.   Any Other Special Instructions Will Be Listed Below (If Applicable).     If you need a refill on your cardiac medications before your next appointment, please call your pharmacy.      Signed, Sinclair Grooms, MD  11/03/2016 1:02 PM    [facility] [place], [place], [place]  [address] Phone: [phone]; Fax: [phone]

## 2016-11-03 ENCOUNTER — Encounter: Payer: Self-pay | Admitting: Interventional Cardiology

## 2016-11-03 ENCOUNTER — Ambulatory Visit (INDEPENDENT_AMBULATORY_CARE_PROVIDER_SITE_OTHER): Payer: Medicare Other | Admitting: Interventional Cardiology

## 2016-11-03 VITALS — BP 146/70 | HR 54 | Ht 67.5 in | Wt 139.0 lb

## 2016-11-03 DIAGNOSIS — Z7901 Long term (current) use of anticoagulants: Secondary | ICD-10-CM

## 2016-11-03 DIAGNOSIS — I4892 Unspecified atrial flutter: Secondary | ICD-10-CM | POA: Diagnosis not present

## 2016-11-03 DIAGNOSIS — I10 Essential (primary) hypertension: Secondary | ICD-10-CM

## 2016-11-03 DIAGNOSIS — I495 Sick sinus syndrome: Secondary | ICD-10-CM | POA: Diagnosis not present

## 2016-11-03 NOTE — Patient Instructions (Signed)

## 2016-11-15 ENCOUNTER — Other Ambulatory Visit: Payer: Self-pay | Admitting: Physician Assistant

## 2016-11-16 DIAGNOSIS — I10 Essential (primary) hypertension: Secondary | ICD-10-CM | POA: Diagnosis not present

## 2016-11-16 DIAGNOSIS — G479 Sleep disorder, unspecified: Secondary | ICD-10-CM | POA: Diagnosis not present

## 2016-11-17 ENCOUNTER — Other Ambulatory Visit: Payer: Self-pay | Admitting: Interventional Cardiology

## 2016-11-22 ENCOUNTER — Ambulatory Visit (INDEPENDENT_AMBULATORY_CARE_PROVIDER_SITE_OTHER): Payer: Medicare Other

## 2016-11-22 DIAGNOSIS — Z5181 Encounter for therapeutic drug level monitoring: Secondary | ICD-10-CM

## 2016-11-22 DIAGNOSIS — I4891 Unspecified atrial fibrillation: Secondary | ICD-10-CM

## 2016-11-22 LAB — POCT INR: INR: 2.7

## 2016-12-14 ENCOUNTER — Other Ambulatory Visit: Payer: Self-pay | Admitting: Oncology

## 2016-12-14 DIAGNOSIS — Z1231 Encounter for screening mammogram for malignant neoplasm of breast: Secondary | ICD-10-CM

## 2016-12-22 DIAGNOSIS — C50911 Malignant neoplasm of unspecified site of right female breast: Secondary | ICD-10-CM | POA: Diagnosis not present

## 2017-01-03 ENCOUNTER — Ambulatory Visit (INDEPENDENT_AMBULATORY_CARE_PROVIDER_SITE_OTHER): Payer: Medicare Other | Admitting: *Deleted

## 2017-01-03 DIAGNOSIS — I4891 Unspecified atrial fibrillation: Secondary | ICD-10-CM | POA: Diagnosis not present

## 2017-01-03 DIAGNOSIS — I4892 Unspecified atrial flutter: Secondary | ICD-10-CM

## 2017-01-03 DIAGNOSIS — Z5181 Encounter for therapeutic drug level monitoring: Secondary | ICD-10-CM

## 2017-01-03 LAB — POCT INR: INR: 2.3

## 2017-01-03 NOTE — Patient Instructions (Signed)
Continue on same dosage 1.5 tablets everyday except 1 tablet on  Mondays and Fridays. Recheck in 6 weeks. Call if scheduled for any procedures [phone]

## 2017-01-07 DIAGNOSIS — H349 Unspecified retinal vascular occlusion: Secondary | ICD-10-CM | POA: Diagnosis not present

## 2017-01-07 DIAGNOSIS — H35373 Puckering of macula, bilateral: Secondary | ICD-10-CM | POA: Diagnosis not present

## 2017-01-07 DIAGNOSIS — H40051 Ocular hypertension, right eye: Secondary | ICD-10-CM | POA: Diagnosis not present

## 2017-01-18 ENCOUNTER — Other Ambulatory Visit: Payer: Self-pay | Admitting: Oncology

## 2017-01-18 DIAGNOSIS — Z853 Personal history of malignant neoplasm of breast: Secondary | ICD-10-CM

## 2017-01-19 ENCOUNTER — Other Ambulatory Visit: Payer: Self-pay | Admitting: Obstetrics & Gynecology

## 2017-01-20 ENCOUNTER — Ambulatory Visit
Admission: RE | Admit: 2017-01-20 | Discharge: 2017-01-20 | Disposition: A | Discharge status: Home / Self Care | Payer: Medicare Other | Source: Ambulatory Visit | Attending: Oncology | Admitting: Oncology

## 2017-01-20 DIAGNOSIS — Z853 Personal history of malignant neoplasm of breast: Secondary | ICD-10-CM

## 2017-01-20 DIAGNOSIS — R922 Inconclusive mammogram: Secondary | ICD-10-CM | POA: Diagnosis not present

## 2017-02-17 ENCOUNTER — Ambulatory Visit (INDEPENDENT_AMBULATORY_CARE_PROVIDER_SITE_OTHER): Payer: Medicare Other | Admitting: *Deleted

## 2017-02-17 DIAGNOSIS — I4891 Unspecified atrial fibrillation: Secondary | ICD-10-CM

## 2017-02-17 DIAGNOSIS — I4892 Unspecified atrial flutter: Secondary | ICD-10-CM | POA: Diagnosis not present

## 2017-02-17 DIAGNOSIS — Z5181 Encounter for therapeutic drug level monitoring: Secondary | ICD-10-CM

## 2017-02-17 LAB — POCT INR: INR: 3.4

## 2017-02-17 NOTE — Patient Instructions (Signed)
Description   Do not take coumadin today Jan 17th then continue on same dosage 1.5 tablets everyday except 1 tablet on  Mondays and Fridays. Recheck in 2 weeks. Call if scheduled for any procedures [phone] Do extra serving of greens today and tomorrow

## 2017-03-03 ENCOUNTER — Ambulatory Visit (INDEPENDENT_AMBULATORY_CARE_PROVIDER_SITE_OTHER): Payer: Medicare Other | Admitting: *Deleted

## 2017-03-03 ENCOUNTER — Encounter (INDEPENDENT_AMBULATORY_CARE_PROVIDER_SITE_OTHER): Payer: Self-pay

## 2017-03-03 DIAGNOSIS — I4891 Unspecified atrial fibrillation: Secondary | ICD-10-CM | POA: Diagnosis not present

## 2017-03-03 DIAGNOSIS — Z5181 Encounter for therapeutic drug level monitoring: Secondary | ICD-10-CM | POA: Diagnosis not present

## 2017-03-03 LAB — POCT INR: INR: 2.3

## 2017-03-03 NOTE — Patient Instructions (Signed)
Description   Continue on same dosage 1.5 tablets everyday except 1 tablet on  Mondays and Fridays. Recheck in 4 weeks. Call if scheduled for any procedures [phone] Do extra serving of greens today and tomorrow

## 2017-03-21 ENCOUNTER — Other Ambulatory Visit: Payer: Self-pay | Admitting: Interventional Cardiology

## 2017-03-22 ENCOUNTER — Other Ambulatory Visit: Payer: Self-pay | Admitting: Oncology

## 2017-03-23 ENCOUNTER — Inpatient Hospital Stay: Payer: Medicare Other

## 2017-03-23 ENCOUNTER — Telehealth: Payer: Self-pay | Admitting: Oncology

## 2017-03-23 ENCOUNTER — Inpatient Hospital Stay: Payer: Medicare Other | Attending: Oncology | Admitting: Oncology

## 2017-03-23 VITALS — BP 141/65 | HR 85 | Temp 98.2°F | Resp 20 | Ht 67.5 in | Wt 132.7 lb

## 2017-03-23 DIAGNOSIS — Z170 Estrogen receptor positive status [ER+]: Secondary | ICD-10-CM

## 2017-03-23 DIAGNOSIS — Z7901 Long term (current) use of anticoagulants: Secondary | ICD-10-CM | POA: Insufficient documentation

## 2017-03-23 DIAGNOSIS — C50211 Malignant neoplasm of upper-inner quadrant of right female breast: Secondary | ICD-10-CM | POA: Insufficient documentation

## 2017-03-23 DIAGNOSIS — F419 Anxiety disorder, unspecified: Secondary | ICD-10-CM | POA: Diagnosis not present

## 2017-03-23 DIAGNOSIS — Z8719 Personal history of other diseases of the digestive system: Secondary | ICD-10-CM | POA: Diagnosis not present

## 2017-03-23 DIAGNOSIS — I6529 Occlusion and stenosis of unspecified carotid artery: Secondary | ICD-10-CM | POA: Diagnosis not present

## 2017-03-23 DIAGNOSIS — M8580 Other specified disorders of bone density and structure, unspecified site: Secondary | ICD-10-CM

## 2017-03-23 DIAGNOSIS — Z800 Family history of malignant neoplasm of digestive organs: Secondary | ICD-10-CM | POA: Insufficient documentation

## 2017-03-23 DIAGNOSIS — E7800 Pure hypercholesterolemia, unspecified: Secondary | ICD-10-CM | POA: Diagnosis not present

## 2017-03-23 DIAGNOSIS — Z803 Family history of malignant neoplasm of breast: Secondary | ICD-10-CM | POA: Diagnosis not present

## 2017-03-23 DIAGNOSIS — N3281 Overactive bladder: Secondary | ICD-10-CM

## 2017-03-23 DIAGNOSIS — I129 Hypertensive chronic kidney disease with stage 1 through stage 4 chronic kidney disease, or unspecified chronic kidney disease: Secondary | ICD-10-CM

## 2017-03-23 DIAGNOSIS — G4700 Insomnia, unspecified: Secondary | ICD-10-CM | POA: Diagnosis not present

## 2017-03-23 DIAGNOSIS — R232 Flushing: Secondary | ICD-10-CM | POA: Diagnosis not present

## 2017-03-23 DIAGNOSIS — I340 Nonrheumatic mitral (valve) insufficiency: Secondary | ICD-10-CM | POA: Diagnosis not present

## 2017-03-23 DIAGNOSIS — Z79899 Other long term (current) drug therapy: Secondary | ICD-10-CM | POA: Diagnosis not present

## 2017-03-23 DIAGNOSIS — Z79811 Long term (current) use of aromatase inhibitors: Secondary | ICD-10-CM | POA: Insufficient documentation

## 2017-03-23 DIAGNOSIS — Z87891 Personal history of nicotine dependence: Secondary | ICD-10-CM

## 2017-03-23 DIAGNOSIS — I495 Sick sinus syndrome: Secondary | ICD-10-CM

## 2017-03-23 DIAGNOSIS — N182 Chronic kidney disease, stage 2 (mild): Secondary | ICD-10-CM | POA: Diagnosis not present

## 2017-03-23 DIAGNOSIS — Z8042 Family history of malignant neoplasm of prostate: Secondary | ICD-10-CM | POA: Insufficient documentation

## 2017-03-23 DIAGNOSIS — I4892 Unspecified atrial flutter: Secondary | ICD-10-CM | POA: Diagnosis not present

## 2017-03-23 DIAGNOSIS — J00 Acute nasopharyngitis [common cold]: Secondary | ICD-10-CM | POA: Insufficient documentation

## 2017-03-23 LAB — CBC WITH DIFFERENTIAL/PLATELET
BASOS PCT: 0 %
Basophils Absolute: 0 10*3/uL (ref 0.0–0.1)
EOS ABS: 0 10*3/uL (ref 0.0–0.5)
Eosinophils Relative: 0 %
HCT: 40.5 % (ref 34.8–46.6)
Hemoglobin: 13.2 g/dL (ref 11.6–15.9)
Lymphocytes Relative: 10 %
Lymphs Abs: 1.3 10*3/uL (ref 0.9–3.3)
MCH: 28.4 pg (ref 25.1–34.0)
MCHC: 32.6 g/dL (ref 31.5–36.0)
MCV: 87.1 fL (ref 79.5–101.0)
MONO ABS: 1.2 10*3/uL — AB (ref 0.1–0.9)
MONOS PCT: 10 %
Neutro Abs: 9.7 10*3/uL — ABNORMAL HIGH (ref 1.5–6.5)
Neutrophils Relative %: 80 %
Platelets: 209 10*3/uL (ref 145–400)
RBC: 4.65 MIL/uL (ref 3.70–5.45)
RDW: 15.2 % — AB (ref 11.2–14.5)
WBC: 12.1 10*3/uL — ABNORMAL HIGH (ref 3.9–10.3)

## 2017-03-23 LAB — COMPREHENSIVE METABOLIC PANEL
ALBUMIN: 3.8 g/dL (ref 3.5–5.0)
ALT: 10 U/L (ref 0–55)
ANION GAP: 11 (ref 3–11)
AST: 18 U/L (ref 5–34)
Alkaline Phosphatase: 51 U/L (ref 40–150)
BUN: 10 mg/dL (ref 7–26)
CHLORIDE: 103 mmol/L (ref 98–109)
CO2: 25 mmol/L (ref 22–29)
Calcium: 10.1 mg/dL (ref 8.4–10.4)
Creatinine, Ser: 1 mg/dL (ref 0.60–1.10)
GFR calc Af Amer: 59 mL/min — ABNORMAL LOW (ref >60)
GFR calc non Af Amer: 51 mL/min — ABNORMAL LOW (ref >60)
GLUCOSE: 102 mg/dL (ref 70–140)
POTASSIUM: 3.7 mmol/L (ref 3.5–5.1)
SODIUM: 139 mmol/L (ref 136–145)
Total Bilirubin: 0.8 mg/dL (ref 0.2–1.2)
Total Protein: 7.5 g/dL (ref 6.4–8.3)

## 2017-03-23 MED ORDER — DENOSUMAB 60 MG/ML ~~LOC~~ SOLN
60.0000 mg | Freq: Once | SUBCUTANEOUS | Status: AC
  Administered 2017-03-23: 60 mg via SUBCUTANEOUS

## 2017-03-23 MED ORDER — ANASTROZOLE 1 MG PO TABS: 1.0000 mg | ORAL_TABLET | Freq: Every day | ORAL | 12 refills | Status: DC

## 2017-03-23 MED ORDER — CEPHALEXIN 500 MG PO CAPS: 500.0000 mg | ORAL_CAPSULE | Freq: Two times a day (BID) | ORAL | 0 refills | Status: DC

## 2017-03-23 NOTE — Patient Instructions (Signed)

## 2017-03-23 NOTE — Progress Notes (Signed)
[place]  Telephone:([phone] Fax:(336) [phone]     ID: Martha Edwards DOB: 1932-05-19  MR#: [id number]  JEH#:[id number]  Patient Care Team: [name], MD as PCP - General (Family Medicine) Excell Seltzer, MD as Consulting Physician (General Surgery) Cataleah Stites, Virgie Dad, MD as Consulting Physician (Oncology) Eppie Gibson, MD as Attending Physician (Radiation Oncology) PCP: [name], MD Chauncey Cruel, MD OTHER MD:  CHIEF COMPLAINT: Estrogen receptor positive breast cancer  CURRENT TREATMENT: Anastrozole   BREAST CANCER HISTORY: From the original intake note:  The patient had routine screening mammography at the [facility] 01/20/2015 showing a possible mass in the right breast. On 02/19/2015 she returned for right diagnostic mammography with tomosynthesis and right breast ultrasonography. The breast density was category B. In the upper inner quadrant of the right breast there was a 1.3 cm irregular mass which was palpable by exam. Ultrasonography measured this at 1.3 cm maximally. Ultrasound of the right axilla was unremarkable.  Biopsy of this mass 02/27/2015 showed ([id number]) an invasive ductal carcinoma, grade 1 or 2, 100% estrogen receptor positive, with strong staining intensity; 90% progesterone receptor positive with moderate staining intensity, with an MIB-1 of 10%, and no HER-2 implication, the signals ratio 1.15 and the number per cell 1.95.  The case was discussed at breast cancer conference and it was decided sentinel lymph node sampling and Oncotype would not be needed. The patient proceeded to right lumpectomy 04/07/2015, with the final pathology ([id number]) showing an invasive ductal carcinoma, grade 1, measuring 1.3 cm. Margins were negative. Repeat HER-2 was again negative, with a signals ratio of 1.43, the number per cell being 2.00.  The patient met with radiation oncology 05/16/2015. At that time the possibility of  adjuvant radiation versus anti-estrogens only was discussed. The patient was supposed to have seen me after that visit but she did not show. She subsequently called and said that she would like "pill" rather than radiation and was started on anastrozole April 2017.  Her subsequent history is as detailed below  INTERVAL HISTORY: Martha Edwards returns today for follow-up of her estrogen receptor positive breast cancer . She continues on anastrozole, with excellent tolerance.  Hot flashes and vaginal dryness are not significant problems  Since her last visit, she underwent diagnostic bilateral mammography with CAD and tomography on 01/20/2017 at [facility] showing: breast density category C. There was no evidence of malignancy.   She receives denosumab/Prolia for her severe osteopenia.  She will receive a dose today.  He tolerates that with no side effects that she is aware of.   REVIEW OF SYSTEMS: Martha Edwards tells me she has had a cold for the last couple of days.  She has had a little bit of a cough, productive of yellow to green sputum, and a little bit of shortness of breath, but these are fairly minimal symptoms.  And she has had absolutely no fever or temperature.  It is true that she does not have a thermometer at home.  She thinks she has lost a little weight.  She weighed 137 pounds a year ago and weighs.  [facility] for her to drive.  Otherwise a detailed review of systems today was stable  PAST MEDICAL HISTORY: Past Medical History:  Diagnosis Date  . Anticoagulated   . Anxiety   . Arthritis of hand   . Atrial flutter ([place])    with LVEF 60%,  LVH, LAE and holter with rate control and Ave HR 60 bpm   . Carotid artery plaque    mild bilateral carotid plaque without significant stenosis (<20% in left ICA), 04/2010  . CKD (chronic kidney disease) stage 2, GFR 60-89 ml/min   . Diverticula, colon   . Hypercholesterolemia   . Hypertension      with LVH on echo 06/03/11  . Hypertonicity of bladder   . Insomnia   . Mitral regurgitation   . Osteopenia    > osteoporosis  . Overactive bladder     PAST SURGICAL HISTORY: Past Surgical History:  Procedure Laterality Date  . BREAST BIOPSY    . BREAST LUMPECTOMY Right    2017  . BREAST LUMPECTOMY WITH RADIOACTIVE SEED LOCALIZATION Right 04/07/2015   Procedure: RIGHT BREAST LUMPECTOMY WITH RADIOACTIVE SEED LOCALIZATION;  Surgeon: Excell Seltzer, MD;  Location: [facility];  Service: General;  Laterality: Right;  . COLONOSCOPY  2001   2007 sigmoidoscopy     FAMILY HISTORY Family History  Problem Relation Age of Onset  . Diabetes Mother   . Liver cancer Mother   . Breast cancer Sister   . Prostate cancer Brother   . Heart attack Neg Hx   . Stroke Neg Hx   Patient's father died in his 6s from causes unknown to the patient. The patient's mother died at the age of 67 with what may have been breast cancer. The patient had 2 brothers one sister. One sister was diagnosed with breast cancer in her early 25s.  GYNECOLOGIC HISTORY:  No LMP recorded. Patient is postmenopausal. Menarche age 10, first live birth age 57, the patient is Martha Edwards. She does not recall when she went through menopause. She never took hormone replacement or oral contraceptives  SOCIAL HISTORY:  The patient worked at [employer] for many years. She is now retired. She is widowed, lives by herself, with no pets. Her daughter [name] is an Optometrist for the [employer] system. Son [name] lives in [place] and is disabled. Son [name] lives in [place] and works for [employer]. The patient has 4 grandchildren and 2 great-grandchildren.    ADVANCED DIRECTIVES: The patient tells me her daughter Olin Hauser is her healthcare part of attorney. She can be reached at [facility]: Social History   Tobacco Use  . Smoking status: Former Smoker    Last attempt to  quit: 02/02/1984    Years since quitting: 33.1  . Smokeless tobacco: Never Used  Substance Use Topics  . Alcohol use: Not on file  . Drug use: Not on file     Colonoscopy:  PAP:  Bone density:   No Known Allergies  Current Outpatient Medications  Medication Sig Dispense Refill  . acetaminophen (TYLENOL) 325 MG tablet Take 650 mg by mouth every 6 (six) hours as needed for mild pain.    Marland Kitchen amLODipine (NORVASC) 2.5 MG tablet TAKE 1 TABLET BY MOUTH DAILY. 30 tablet 11  . anastrozole (ARIMIDEX) 1 MG tablet Take 1 tablet (1 mg total) by mouth daily. 30 tablet 12  . Cholecalciferol (VITAMIN D3 PO) Take 1 tablet by mouth daily.    . Denosumab (PROLIA [place]) Take as directed once every 6 months.    . metoprolol succinate (TOPROL-XL) 25 MG 24 hr tablet Take 12.5 mg by mouth daily.    Marland Kitchen warfarin (COUMADIN) 4 MG tablet TAKE AS DIRECTED BY COUMADIN CLINIC 45 tablet 3  . zolpidem (AMBIEN)  10 MG tablet Take 5 mg by mouth at bedtime as needed for sleep.     No current facility-administered medications for this visit.     OBJECTIVE: Older African-American woman In no acute distress   Vitals:   03/23/17 1321  BP: (!) 141/65  Pulse: 85  Resp: 20  Temp: 98.2 F (36.8 C)  SpO2: 95%     Body mass index is 20.48 kg/m.    ECOG FS:1 - Symptomatic but completely ambulatory  Sclerae unicteric, bilateral arcus senilis No cervical or supraclavicular adenopathy Lungs no rales or rhonchi, no wheezes appreciated Heart regular rate and rhythm Abd soft, nontender, positive bowel sounds MSK no focal spinal tenderness, no upper extremity lymphedema Neuro: nonfocal, well oriented, appropriate affect Breasts: The right breast status post lumpectomy.  There is no evidence of local recurrence.  The left breast is unremarkable.  Both axillae are benign.  LAB RESULTS:  CMP     Component Value Date/Time   NA 142 10/21/2016 1334   K 4.2 10/21/2016 1334   CO2 25 10/21/2016 1334   GLUCOSE 85 10/21/2016 1334    BUN 11.5 10/21/2016 1334   CREATININE 0.9 10/21/2016 1334   CALCIUM 9.9 10/21/2016 1334   PROT 7.2 10/21/2016 1334   ALBUMIN 4.1 10/21/2016 1334   AST 19 10/21/2016 1334   ALT 10 10/21/2016 1334   ALKPHOS 49 10/21/2016 1334   BILITOT 0.76 10/21/2016 1334    INo results found for: SPEP, UPEP  Lab Results  Component Value Date   WBC 12.1 (H) 03/23/2017   NEUTROABS 9.7 (H) 03/23/2017   HGB 13.2 03/23/2017   HCT 40.5 03/23/2017   MCV 87.1 03/23/2017   PLT 209 03/23/2017      Chemistry      Component Value Date/Time   NA 142 10/21/2016 1334   K 4.2 10/21/2016 1334   CO2 25 10/21/2016 1334   BUN 11.5 10/21/2016 1334   CREATININE 0.9 10/21/2016 1334      Component Value Date/Time   CALCIUM 9.9 10/21/2016 1334   ALKPHOS 49 10/21/2016 1334   AST 19 10/21/2016 1334   ALT 10 10/21/2016 1334   BILITOT 0.76 10/21/2016 1334       No results found for: LABCA2  No components found for: LABCA125  No results for input(s): INR in the last 168 hours.  Urinalysis No results found for: COLORURINE, APPEARANCEUR, LABSPEC, PHURINE, GLUCOSEU, HGBUR, BILIRUBINUR, KETONESUR, PROTEINUR, UROBILINOGEN, NITRITE, LEUKOCYTESUR   STUDIES: Since her last visit, she underwent diagnostic bilateral mammography with CAD and tomography on 01/20/2017 at [facility] showing: breast density category C. There was no evidence of malignancy.   ELIGIBLE FOR AVAILABLE RESEARCH PROTOCOL: no  ASSESSMENT: 84 y.o. [place] woman status post right breast upper inner quadrant lumpectomy 04/07/2015 for a pT1c cN0, stage IA invasive ductal carcinoma, grade 1, estrogen and progesterone receptor positive, HER-2 nonamplified, with an MIB-1 of 10%.  (1) the patient opted against adjuvant radiation   (2) started anastrozole April 2017    (3) may qualify for genetics counseling  (4) severe osteopenia, on denosumab/Prolia started 05/06/2016  (a) bone density 01/20/2016 showed a T score of  -2.4   PLAN: Martha Edwards is now just about 2 years out from definitive surgery for breast cancer with no evidence of disease recurrence.  This is very favorable.  She is tolerating anastrozole well and the plan is to continue that for a total of 5 years which will take [place] to March 2022  The  big concern of course is her severe osteopenia.  She was started on Prolia a year ago.  The plan is to continue that through this year and then she will have a density at the end of this year with her next mammogram  We are giving her a thermometer that she can use if she feels she has a fever and if the temperature is above 100 she will call us.  Otherwise she will return in 6 months for Prolia alone and then in 12 months for a visit and another dose of Prolia  She knows to call for any other issues that may develop before the next visit.  Martha Edwards, Virgie Dad, MD  03/23/17 1:43 PM Medical Oncology and Hematology [facility] [address] [place], [place] [address] Tel. [phone]    Fax. [phone]  This document serves as a record of services personally performed by Lurline Del, MD. It was created on his behalf by Sheron Nightingale, a trained medical scribe. The creation of this record is based on the scribe's personal observations and the provider's statements to them.   I have reviewed the above documentation for accuracy and completeness, and I agree with the above.   ADDENDUM: We rechecked PEG his temperature and it was up to 100.  With that, her phlegm being yellow-green and her white cell count being up, I am putting her on cephalexin for the next 5 days  She will let me know if she has any problems from that medication.

## 2017-03-23 NOTE — Telephone Encounter (Signed)
Gave avs and calendar GI will call patient to schedule Bone Density

## 2017-03-23 NOTE — Telephone Encounter (Signed)
Returned call to patient regarding her appointment for 2/20.  HSe wanted to r/s then changed her mind due to weather being ok

## 2017-03-26 DIAGNOSIS — Z23 Encounter for immunization: Secondary | ICD-10-CM | POA: Diagnosis not present

## 2017-03-31 ENCOUNTER — Ambulatory Visit (INDEPENDENT_AMBULATORY_CARE_PROVIDER_SITE_OTHER): Payer: Medicare Other | Admitting: Pharmacist

## 2017-03-31 DIAGNOSIS — I4891 Unspecified atrial fibrillation: Secondary | ICD-10-CM | POA: Diagnosis not present

## 2017-03-31 LAB — POCT INR: INR: 4.4

## 2017-03-31 NOTE — Patient Instructions (Signed)
Description   Skip your Coumadin today and tomorrow, then continue on same dosage 1.5 tablets everyday except 1 tablet on  Mondays and Fridays. Recheck in 10 days. Call if scheduled for any procedures [phone] Do extra serving of greens today and tomorrow

## 2017-04-11 ENCOUNTER — Ambulatory Visit (INDEPENDENT_AMBULATORY_CARE_PROVIDER_SITE_OTHER): Payer: Medicare Other | Admitting: *Deleted

## 2017-04-11 ENCOUNTER — Encounter (INDEPENDENT_AMBULATORY_CARE_PROVIDER_SITE_OTHER): Payer: Self-pay

## 2017-04-11 DIAGNOSIS — Z5181 Encounter for therapeutic drug level monitoring: Secondary | ICD-10-CM | POA: Diagnosis not present

## 2017-04-11 DIAGNOSIS — I4891 Unspecified atrial fibrillation: Secondary | ICD-10-CM | POA: Diagnosis not present

## 2017-04-11 LAB — POCT INR: INR: 3

## 2017-04-11 NOTE — Patient Instructions (Addendum)
Description   Continue taking same dosage of 1.5 tablets everyday except 1 tablet on Mondays and Fridays. Recheck in 3 weeks. Call if scheduled for any procedures [phone]

## 2017-04-19 ENCOUNTER — Ambulatory Visit: Payer: Medicare Other | Admitting: Oncology

## 2017-05-02 ENCOUNTER — Ambulatory Visit (INDEPENDENT_AMBULATORY_CARE_PROVIDER_SITE_OTHER): Payer: Medicare Other | Admitting: *Deleted

## 2017-05-02 DIAGNOSIS — Z5181 Encounter for therapeutic drug level monitoring: Secondary | ICD-10-CM | POA: Insufficient documentation

## 2017-05-02 DIAGNOSIS — I4891 Unspecified atrial fibrillation: Secondary | ICD-10-CM | POA: Diagnosis not present

## 2017-05-02 LAB — POCT INR: INR: 3.4

## 2017-05-02 NOTE — Patient Instructions (Signed)
Description   Do not take any Coumadin today then start taking same dosage of 1.5 tablets everyday except 1 tablet on Mondays, Wednesday, and Fridays. Recheck in 2 weeks. Call if scheduled for any procedures [phone]

## 2017-05-16 ENCOUNTER — Ambulatory Visit (INDEPENDENT_AMBULATORY_CARE_PROVIDER_SITE_OTHER): Payer: Medicare Other | Admitting: *Deleted

## 2017-05-16 DIAGNOSIS — I4891 Unspecified atrial fibrillation: Secondary | ICD-10-CM | POA: Diagnosis not present

## 2017-05-16 DIAGNOSIS — Z5181 Encounter for therapeutic drug level monitoring: Secondary | ICD-10-CM | POA: Diagnosis not present

## 2017-05-16 LAB — POCT INR: INR: 2.2

## 2017-05-16 NOTE — Patient Instructions (Signed)
Description   Continue taking same dosage of 1.5 tablets everyday except 1 tablet on Mondays, Wednesday, and Fridays. Recheck in 3 weeks. Call if scheduled for any procedures [phone]

## 2017-05-23 DIAGNOSIS — Z79899 Other long term (current) drug therapy: Secondary | ICD-10-CM | POA: Diagnosis not present

## 2017-05-23 DIAGNOSIS — Z0000 Encounter for general adult medical examination without abnormal findings: Secondary | ICD-10-CM | POA: Diagnosis not present

## 2017-05-23 DIAGNOSIS — G479 Sleep disorder, unspecified: Secondary | ICD-10-CM | POA: Diagnosis not present

## 2017-05-23 DIAGNOSIS — M810 Age-related osteoporosis without current pathological fracture: Secondary | ICD-10-CM | POA: Diagnosis not present

## 2017-05-23 DIAGNOSIS — E785 Hyperlipidemia, unspecified: Secondary | ICD-10-CM | POA: Diagnosis not present

## 2017-05-23 DIAGNOSIS — Z6820 Body mass index (BMI) 20.0-20.9, adult: Secondary | ICD-10-CM | POA: Diagnosis not present

## 2017-05-23 DIAGNOSIS — F322 Major depressive disorder, single episode, severe without psychotic features: Secondary | ICD-10-CM | POA: Diagnosis not present

## 2017-05-23 DIAGNOSIS — R634 Abnormal weight loss: Secondary | ICD-10-CM | POA: Diagnosis not present

## 2017-06-07 ENCOUNTER — Ambulatory Visit (INDEPENDENT_AMBULATORY_CARE_PROVIDER_SITE_OTHER): Payer: Medicare Other

## 2017-06-07 DIAGNOSIS — I4891 Unspecified atrial fibrillation: Secondary | ICD-10-CM

## 2017-06-07 DIAGNOSIS — Z5181 Encounter for therapeutic drug level monitoring: Secondary | ICD-10-CM

## 2017-06-07 LAB — POCT INR: INR: 1.7

## 2017-06-07 NOTE — Patient Instructions (Signed)
Description   Start taking 1.5 tablets everyday except 1 tablet on Mondays and Fridays. Recheck in 2 weeks. Call if scheduled for any procedures [phone]

## 2017-06-09 DIAGNOSIS — C50911 Malignant neoplasm of unspecified site of right female breast: Secondary | ICD-10-CM | POA: Diagnosis not present

## 2017-06-21 ENCOUNTER — Ambulatory Visit (INDEPENDENT_AMBULATORY_CARE_PROVIDER_SITE_OTHER): Payer: Medicare Other | Admitting: *Deleted

## 2017-06-21 DIAGNOSIS — Z5181 Encounter for therapeutic drug level monitoring: Secondary | ICD-10-CM

## 2017-06-21 DIAGNOSIS — G479 Sleep disorder, unspecified: Secondary | ICD-10-CM | POA: Diagnosis not present

## 2017-06-21 DIAGNOSIS — F322 Major depressive disorder, single episode, severe without psychotic features: Secondary | ICD-10-CM | POA: Diagnosis not present

## 2017-06-21 DIAGNOSIS — I4892 Unspecified atrial flutter: Secondary | ICD-10-CM | POA: Diagnosis not present

## 2017-06-21 DIAGNOSIS — E46 Unspecified protein-calorie malnutrition: Secondary | ICD-10-CM | POA: Diagnosis not present

## 2017-06-21 LAB — POCT INR: INR: 1.6 — AB (ref 2.0–3.0)

## 2017-06-21 NOTE — Patient Instructions (Signed)
Description   Today May 21st take 2 tablets (8mg ) then change coumadin dose to  1.5 tablets (6mg )  everyday except 1 tablet (4mg )  only on  Fridays . Recheck in 2 weeks. Call if scheduled for any procedures [phone]

## 2017-07-05 ENCOUNTER — Ambulatory Visit (INDEPENDENT_AMBULATORY_CARE_PROVIDER_SITE_OTHER): Payer: Medicare Other | Admitting: *Deleted

## 2017-07-05 DIAGNOSIS — Z5181 Encounter for therapeutic drug level monitoring: Secondary | ICD-10-CM | POA: Diagnosis not present

## 2017-07-05 LAB — POCT INR: INR: 1.6 — AB (ref 2.0–3.0)

## 2017-07-05 NOTE — Patient Instructions (Signed)
Description   Today take 2 tablets,  then change coumadin dose to 1.5 tablets (6mg )  everyday. Recheck in 2 weeks. Call if scheduled for any procedures [phone]

## 2017-07-08 DIAGNOSIS — H40051 Ocular hypertension, right eye: Secondary | ICD-10-CM | POA: Diagnosis not present

## 2017-07-19 ENCOUNTER — Ambulatory Visit (INDEPENDENT_AMBULATORY_CARE_PROVIDER_SITE_OTHER): Payer: Medicare Other | Admitting: *Deleted

## 2017-07-19 DIAGNOSIS — Z5181 Encounter for therapeutic drug level monitoring: Secondary | ICD-10-CM

## 2017-07-19 LAB — POCT INR: INR: 2.1 (ref 2.0–3.0)

## 2017-07-19 NOTE — Patient Instructions (Signed)
Description   Continue taking 1.5 tablets (6mg ) everyday. Recheck in 3 weeks. Call if scheduled for any procedures [phone]

## 2017-08-09 ENCOUNTER — Ambulatory Visit (INDEPENDENT_AMBULATORY_CARE_PROVIDER_SITE_OTHER): Payer: Medicare Other

## 2017-08-09 DIAGNOSIS — Z5181 Encounter for therapeutic drug level monitoring: Secondary | ICD-10-CM

## 2017-08-09 DIAGNOSIS — I4892 Unspecified atrial flutter: Secondary | ICD-10-CM

## 2017-08-09 LAB — POCT INR: INR: 2.2 (ref 2.0–3.0)

## 2017-08-09 NOTE — Patient Instructions (Signed)
Description   Continue taking 1.5 tablets (6mg ) everyday. Recheck in 4 weeks. Call if scheduled for any procedures [phone]

## 2017-08-14 ENCOUNTER — Other Ambulatory Visit: Payer: Self-pay | Admitting: Interventional Cardiology

## 2017-09-06 ENCOUNTER — Ambulatory Visit (INDEPENDENT_AMBULATORY_CARE_PROVIDER_SITE_OTHER): Payer: Medicare Other

## 2017-09-06 ENCOUNTER — Encounter (INDEPENDENT_AMBULATORY_CARE_PROVIDER_SITE_OTHER): Payer: Self-pay

## 2017-09-06 DIAGNOSIS — I4892 Unspecified atrial flutter: Secondary | ICD-10-CM

## 2017-09-06 DIAGNOSIS — Z5181 Encounter for therapeutic drug level monitoring: Secondary | ICD-10-CM | POA: Diagnosis not present

## 2017-09-06 LAB — POCT INR: INR: 2.1 (ref 2.0–3.0)

## 2017-09-06 NOTE — Patient Instructions (Signed)
Description   Continue taking 1.5 tablets (6mg ) everyday. Recheck in 6 weeks. Call if scheduled for any procedures [phone]

## 2017-09-20 ENCOUNTER — Encounter: Payer: Self-pay | Admitting: Genetics

## 2017-09-20 ENCOUNTER — Inpatient Hospital Stay: Payer: Medicare Other | Attending: Oncology

## 2017-09-20 ENCOUNTER — Inpatient Hospital Stay (HOSPITAL_BASED_OUTPATIENT_CLINIC_OR_DEPARTMENT_OTHER): Payer: Medicare Other | Admitting: Genetics

## 2017-09-20 ENCOUNTER — Inpatient Hospital Stay: Payer: Medicare Other

## 2017-09-20 VITALS — BP 150/64 | HR 53 | Temp 97.8°F | Resp 18

## 2017-09-20 DIAGNOSIS — E7800 Pure hypercholesterolemia, unspecified: Secondary | ICD-10-CM | POA: Insufficient documentation

## 2017-09-20 DIAGNOSIS — Z170 Estrogen receptor positive status [ER+]: Secondary | ICD-10-CM

## 2017-09-20 DIAGNOSIS — N3281 Overactive bladder: Secondary | ICD-10-CM | POA: Diagnosis not present

## 2017-09-20 DIAGNOSIS — I4892 Unspecified atrial flutter: Secondary | ICD-10-CM | POA: Insufficient documentation

## 2017-09-20 DIAGNOSIS — F419 Anxiety disorder, unspecified: Secondary | ICD-10-CM

## 2017-09-20 DIAGNOSIS — C50211 Malignant neoplasm of upper-inner quadrant of right female breast: Secondary | ICD-10-CM | POA: Diagnosis not present

## 2017-09-20 DIAGNOSIS — I340 Nonrheumatic mitral (valve) insufficiency: Secondary | ICD-10-CM | POA: Insufficient documentation

## 2017-09-20 DIAGNOSIS — Z803 Family history of malignant neoplasm of breast: Secondary | ICD-10-CM | POA: Insufficient documentation

## 2017-09-20 DIAGNOSIS — I129 Hypertensive chronic kidney disease with stage 1 through stage 4 chronic kidney disease, or unspecified chronic kidney disease: Secondary | ICD-10-CM

## 2017-09-20 DIAGNOSIS — G4700 Insomnia, unspecified: Secondary | ICD-10-CM | POA: Diagnosis not present

## 2017-09-20 DIAGNOSIS — M810 Age-related osteoporosis without current pathological fracture: Secondary | ICD-10-CM | POA: Diagnosis not present

## 2017-09-20 DIAGNOSIS — M129 Arthropathy, unspecified: Secondary | ICD-10-CM | POA: Diagnosis not present

## 2017-09-20 DIAGNOSIS — Z8042 Family history of malignant neoplasm of prostate: Secondary | ICD-10-CM

## 2017-09-20 DIAGNOSIS — Z79899 Other long term (current) drug therapy: Secondary | ICD-10-CM | POA: Insufficient documentation

## 2017-09-20 DIAGNOSIS — Z87891 Personal history of nicotine dependence: Secondary | ICD-10-CM | POA: Diagnosis not present

## 2017-09-20 DIAGNOSIS — Z79811 Long term (current) use of aromatase inhibitors: Secondary | ICD-10-CM | POA: Diagnosis not present

## 2017-09-20 DIAGNOSIS — Z1379 Encounter for other screening for genetic and chromosomal anomalies: Secondary | ICD-10-CM | POA: Insufficient documentation

## 2017-09-20 DIAGNOSIS — N182 Chronic kidney disease, stage 2 (mild): Secondary | ICD-10-CM | POA: Insufficient documentation

## 2017-09-20 DIAGNOSIS — M8580 Other specified disorders of bone density and structure, unspecified site: Secondary | ICD-10-CM

## 2017-09-20 LAB — COMPREHENSIVE METABOLIC PANEL
ALBUMIN: 4 g/dL (ref 3.5–5.0)
ALT: 14 U/L (ref 0–44)
ANION GAP: 8 (ref 5–15)
AST: 21 U/L (ref 15–41)
Alkaline Phosphatase: 53 U/L (ref 38–126)
BILIRUBIN TOTAL: 0.5 mg/dL (ref 0.3–1.2)
BUN: 14 mg/dL (ref 8–23)
CO2: 28 mmol/L (ref 22–32)
Calcium: 9.7 mg/dL (ref 8.9–10.3)
Chloride: 108 mmol/L (ref 98–111)
Creatinine, Ser: 1.04 mg/dL — ABNORMAL HIGH (ref 0.44–1.00)
GFR calc Af Amer: 56 mL/min — ABNORMAL LOW (ref >60)
GFR calc non Af Amer: 48 mL/min — ABNORMAL LOW (ref >60)
GLUCOSE: 85 mg/dL (ref 70–99)
POTASSIUM: 3.8 mmol/L (ref 3.5–5.1)
Sodium: 144 mmol/L (ref 135–145)
TOTAL PROTEIN: 7.1 g/dL (ref 6.5–8.1)

## 2017-09-20 LAB — CBC WITH DIFFERENTIAL/PLATELET
BASOS ABS: 0 10*3/uL (ref 0.0–0.1)
Basophils Relative: 0 %
Eosinophils Absolute: 0.2 10*3/uL (ref 0.0–0.5)
Eosinophils Relative: 3 %
HEMATOCRIT: 39.1 % (ref 34.8–46.6)
Hemoglobin: 12.9 g/dL (ref 11.6–15.9)
LYMPHS ABS: 1.5 10*3/uL (ref 0.9–3.3)
LYMPHS PCT: 27 %
MCH: 28.2 pg (ref 25.1–34.0)
MCHC: 33 g/dL (ref 31.5–36.0)
MCV: 85.3 fL (ref 79.5–101.0)
MONO ABS: 0.5 10*3/uL (ref 0.1–0.9)
Monocytes Relative: 8 %
NEUTROS ABS: 3.5 10*3/uL (ref 1.5–6.5)
Neutrophils Relative %: 62 %
Platelets: 196 10*3/uL (ref 145–400)
RBC: 4.59 MIL/uL (ref 3.70–5.45)
RDW: 16.3 % — AB (ref 11.2–14.5)
WBC: 5.7 10*3/uL (ref 3.9–10.3)

## 2017-09-20 MED ORDER — DENOSUMAB 60 MG/ML ~~LOC~~ SOLN
60.0000 mg | Freq: Once | SUBCUTANEOUS | Status: AC
  Administered 2017-09-20: 60 mg via SUBCUTANEOUS

## 2017-09-20 NOTE — Progress Notes (Signed)
REFERRING PROVIDER: Chauncey Cruel, MD [place], [place] [address]  PRIMARY PROVIDER:  [name], MD  PRIMARY REASON FOR VISIT:  1. Malignant neoplasm of upper-inner quadrant of right breast in female, estrogen receptor positive ([place])   2. Family history of breast cancer   3. Family history of prostate cancer   4. Carcinoma of upper-inner quadrant of right breast in female, estrogen receptor positive ([place])     HISTORY OF PRESENT ILLNESS:   Martha Edwards, a 84 y.o. female, was seen for a [facility] cancer genetics consultation at the request of Dr. Jana Hakim due to a personal and family history of cancer.  Martha Edwards presents to clinic today to discuss the possibility of a hereditary predisposition to cancer, genetic testing, and to further clarify her future cancer risks, as well as potential cancer risks for family members.   In Jan 2017, at the age of 15, Martha Edwards was diagnosed with  invasive ductal carcinoma of the right breast, ER/PR+, HER2-. This was treated with right lumpectomy followed by antiestrogen therapy.  CANCER HISTORY:    Carcinoma of upper-inner quadrant of right female breast ([place])   01/20/2015 Mammogram    In the right breast, a possible mass warranting further evaluation. In the left breast, no findings suspicious for malignancy    02/19/2015 Breast US    Right breast: irregularly marginated mass with increased vascularity located at 12:30 o'clock position 4 cm from the nipple measuring 1.3 x 1.3 x 1.0 cm in size    02/27/2015 Initial Biopsy    Right breast core needle bx: Invasive ductal carcinoma, grade 1-2, ER+ (100%), PR+ (90%), HER2/neu negative (ratio 1.15), Ki6710%    02/27/2015 Clinical Stage    Stage IA: T1c No    04/07/2015 Definitive Surgery    Invasive ductal carcinoma, grade 1, ER+, PR+, HER2/neu negative, Ki67 10%    04/07/2015 Pathologic Stage    Stage IA: pT1c pNx     Radiation Therapy    pt declined    05/16/2015 -   Anti-estrogen oral therapy    Anastrozole 1 mg daily.    05/21/2015 Survivorship    Survivorship care plan completed and mailed to patient in lieu of in person visit at her request      HORMONAL RISK FACTORS:  Menarche was at age 21.  First live birth at age 19.  OCP use for approximately 0 years.  Ovaries intact: yes.  Hysterectomy: no.  Menopausal status: postmenopausal.  HRT use: 0 years. Colonoscopy: yes; reportedly no polyps.  Past Medical History:  Diagnosis Date  . Anticoagulated   . Anxiety   . Arthritis of hand   . Atrial flutter ([place])    with LVEF 60%, LVH, LAE and holter with rate control and Ave HR 60 bpm   . Carotid artery plaque    mild bilateral carotid plaque without significant stenosis (<20% in left ICA), 04/2010  . CKD (chronic kidney disease) stage 2, GFR 60-89 ml/min   . Diverticula, colon   . Family history of breast cancer   . Family history of prostate cancer   . Hypercholesterolemia   . Hypertension    with LVH on echo 06/03/11  . Hypertonicity of bladder   . Insomnia   . Mitral regurgitation   . Osteopenia    > osteoporosis  . Overactive bladder     Past Surgical History:  Procedure Laterality Date  . BREAST BIOPSY    . BREAST LUMPECTOMY Right  2017  . BREAST LUMPECTOMY WITH RADIOACTIVE SEED LOCALIZATION Right 04/07/2015   Procedure: RIGHT BREAST LUMPECTOMY WITH RADIOACTIVE SEED LOCALIZATION;  Surgeon: Excell Seltzer, MD;  Location: [facility];  Service: General;  Laterality: Right;  . COLONOSCOPY  2001   2007 sigmoidoscopy     Social History   Socioeconomic History  . Marital status: Single    Spouse name: Not on file  . Number of children: Not on file  . Years of education: Not on file  . Highest education level: Not on file  Occupational History  . Not on file  Social Needs  . Financial resource strain: Not on file  . Food insecurity:    Worry: Not on file    Inability: Not on file  . Transportation needs:     Medical: Not on file    Non-medical: Not on file  Tobacco Use  . Smoking status: Former Smoker    Last attempt to quit: 02/02/1984    Years since quitting: 33.6  . Smokeless tobacco: Never Used  Substance and Sexual Activity  . Alcohol use: Not on file  . Drug use: Not on file  . Sexual activity: Never  Lifestyle  . Physical activity:    Days per week: Not on file    Minutes per session: Not on file  . Stress: Not on file  Relationships  . Social connections:    Talks on phone: Not on file    Gets together: Not on file    Attends religious service: Not on file    Active member of club or organization: Not on file    Attends meetings of clubs or organizations: Not on file    Relationship status: Not on file  Other Topics Concern  . Not on file  Social History Narrative  . Not on file     FAMILY HISTORY:  We obtained a detailed, 4-generation family history.  Significant diagnoses are listed below: Family History  Problem Relation Age of Onset  . Diabetes Mother   . Liver cancer Mother   . Breast cancer Sister 10  . Prostate cancer Brother 97       metastatic  . Heart attack Neg Hx   . Stroke Neg Hx    Martha Edwards has a daughter (2) and 2 sons (ages 73 and 51) with no history of cancer.   Martha Edwards has 2 brothers and 1 sister: -1 brother died at 76 due to metastatic prostate cancer dx in his early 7's.   -1 sister was dx with breast cancer in her 54's -40 brother is deceased with no history of cancer.  He had a daughter who died in her 5's due to caner.   Martha Edwards father: died in his 77's unk cause Paternal Aunts/Uncles: 1 paternal aunt and 1 paternal uncle with no history of cancer.  Paternal cousins: no history of cancer. Paternal grandfather: unk Paternal grandmother:unk  Martha Edwards's mother: died at 77 due to liver cancer Maternal Aunts/Uncles: 1 maternal uncle with no history of cancer.  Maternal cousins: deceased, unk cause Maternal grandfather:  unk Maternal grandmother:unk  Martha Edwards is unaware of previous family history of genetic testing for hereditary cancer risks. Patient's maternal ancestors are of African American/Native American descent, and paternal ancestors are of African American descent. There is no reported Ashkenazi Jewish ancestry. There is no known consanguinity.  GENETIC COUNSELING ASSESSMENT: Martha Edwards is a 84 y.o. female with a personal and family hsitory which  is somewhat suggestive of a Hereditary Cancer Predisposition Syndrome. We, therefore, discussed and recommended the following at today's visit.   DISCUSSION: We reviewed the characteristics, features and inheritance patterns of hereditary cancer syndromes. We also discussed genetic testing, including the appropriate family members to test, the process of testing, insurance coverage and turn-around-time for results. We discussed the implications of a negative, positive and/or variant of uncertain significant result. We recommended Ms. Stander pursue genetic testing for the Common Hereditary Cancers gene panel.   The Common Hereditary Cancer Panel offered by [facility] includes sequencing and/or deletion duplication testing of the following 56 genes: APC, ATM, AXIN2, BLM, BARD1,BUB1B,  BMPR1A, BRCA1, BRCA2, BRIP1, CDH1, CDKN2A (p14ARF), CEP57, ENG, CDKN2A (p16INK4a), CKD4, CHEK2, CTNNA1, FLCN, DICER1, EPCAM (Deletion/duplication testing only), GREM1 (promoter region deletion/duplication testing only), GCLNT12, KIT, MEN1, MLH1, MLH3, MSH2, MSH3, MSH6, MUTYH, NBN, NF1, NHTL1, PALB2, PDGFRA, PMS2, POLD1, POLE, PTEN, RAD50, RAD51C, RAD51D, SDHB, SDHC, SDHD, SMAD4, SMARCA4. STK11, RNF43, RSP20, TP53, TSC1, TSC2, and VHL.  The following genes were evaluated for sequence changes only: SDHA and HOXB13 c.251G>A variant only.  We discussed that only 5-10% of cancers are associated with a Hereditary cancer predisposition syndrome.  One of the most common hereditary cancer syndromes that  increases breast cancer risk is called Hereditary Breast and Ovarian Cancer (HBOC) syndrome.  This syndrome is caused by mutations in the BRCA1 and BRCA2 genes.  This syndrome increases an individual's lifetime risk to develop breast, ovarian, pancreatic, and other types of cancer.  There are also many other cancer predisposition syndromes caused by mutations in several other genes.  We discussed that if she is found to have a mutation in one of these genes, it may impact future medical management recommendations such as increased cancer screenings and consideration of risk reducing surgeries.  A positive result could also have implications for the patient's family members.  A Negative result would mean we were unable to identify a hereditary component to her cancer, but does not rule out the possibility of a hereditary basis for her cancer.  There could be mutations that are undetectable by current technology, or in genes not yet tested or identified to increase cancer risk.    We discussed the potential to find a Variant of Uncertain Significance or VUS.  These are variants that have not yet been identified as pathogenic or benign, and it is unknown if this variant is associated with increased cancer risk or if this is a normal finding.  Most VUS's are reclassified to benign or likely benign.   It should not be used to make medical management decisions. With time, we suspect the lab will determine the significance of any VUS's identified if any.   Based on Ms. Ovitt's personal and family history of cancer, she meets medical criteria for genetic testing. Despite that she meets criteria, she may still have an out of pocket cost. The laboratory can provide her with an estimate of her OOP cost.  she was given the contact information for the laboratory if she has further questions. Marland Kitchen   PLAN: Despite our recommendation, Ms. Mosco did not wish to pursue genetic testing at today's visit.  She would like to discuss  this with her family first.  She gave permission for me to discuss her family history and all information obtained during her genetic counseling session with her daughter, [name], if she has questions.  We understand this decision, and remain available to coordinate genetic testing at any time in  the future. We; therefore, recommend Ms. Bundren continue to follow the cancer screening guidelines given by her primary healthcare provider.  Based on Ms. Shinsato's family history, we recommended her siblings/other relatives (especially those affected with caner) also consider having genetic counseling and testing. Ms. Budreau will let us know if we can be of any assistance in coordinating genetic counseling and/or testing for this family member.   Lastly, we encouraged Ms. Shinall to remain in contact with cancer genetics annually so that we can continuously update the family history and inform her of any changes in cancer genetics and testing that may be of benefit for this family.   Ms.  Gangl questions were answered to her satisfaction today. Our contact information was provided should additional questions or concerns arise. Thank you for the referral and allowing [place] to share in the care of your patient.   Tana Felts, MS, [facility] Certified Genetic Counselor [email or web address] phone: [phone]  The patient was seen for a total of 30 minutes in face-to-face genetic counseling.  This patient was discussed with Drs. Magrinat, Lindi Adie and/or Burr Medico who agrees with the above.

## 2017-10-18 ENCOUNTER — Ambulatory Visit (INDEPENDENT_AMBULATORY_CARE_PROVIDER_SITE_OTHER): Payer: Medicare Other | Admitting: Pharmacist

## 2017-10-18 DIAGNOSIS — Z5181 Encounter for therapeutic drug level monitoring: Secondary | ICD-10-CM | POA: Diagnosis not present

## 2017-10-18 LAB — POCT INR: INR: 2.5 (ref 2.0–3.0)

## 2017-10-18 NOTE — Patient Instructions (Addendum)
Description   Continue taking 1.5 tablets (6mg ) everyday. Recheck in 5 weeks - same day as office visit with Dr Tamala Julian. Call if scheduled for any procedures [phone]

## 2017-11-07 ENCOUNTER — Other Ambulatory Visit: Payer: Self-pay | Admitting: Interventional Cardiology

## 2017-11-08 DIAGNOSIS — Z23 Encounter for immunization: Secondary | ICD-10-CM | POA: Diagnosis not present

## 2017-11-14 ENCOUNTER — Other Ambulatory Visit: Payer: Self-pay | Admitting: Physician Assistant

## 2017-11-21 NOTE — Progress Notes (Signed)
Cardiology Office Note:    Date:  11/22/2017   ID:  BRITNE Edwards, DOB Feb 19, 1932, MRN [id number]  PCP:  [name], MD  Cardiologist:  No primary care provider on file.   Referring MD: [name], MD   Chief Complaint  Patient presents with  . Atrial Fibrillation    History of Present Illness:    Martha Edwards is a 84 y.o. female with a hx of tachybradycardia syndrome, PAF, hypertension, and chronic anticoagulation therapy  Mild dyspnea on exertion.  She denies orthopnea, PND, lower extremity swelling.  She has not had syncope.  She denies chest pain.  Cannot remember 5 told her previously about a heart murmur.  We did do an echocardiogram in 2013 which revealed mild mitral regurgitation and aortic valve sclerosis.  My last office note did not document a systolic murmur.  Past Medical History:  Diagnosis Date  . Anticoagulated   . Anxiety   . Arthritis of hand   . Atrial flutter ([place])    with LVEF 60%, LVH, LAE and holter with rate control and Ave HR 60 bpm   . Carotid artery plaque    mild bilateral carotid plaque without significant stenosis (<20% in left ICA), 04/2010  . CKD (chronic kidney disease) stage 2, GFR 60-89 ml/min   . Diverticula, colon   . Family history of breast cancer   . Family history of prostate cancer   . Hypercholesterolemia   . Hypertension    with LVH on echo 06/03/11  . Hypertonicity of bladder   . Insomnia   . Mitral regurgitation   . Osteopenia    > osteoporosis  . Overactive bladder     Past Surgical History:  Procedure Laterality Date  . BREAST BIOPSY    . BREAST LUMPECTOMY Right    2017  . BREAST LUMPECTOMY WITH RADIOACTIVE SEED LOCALIZATION Right 04/07/2015   Procedure: RIGHT BREAST LUMPECTOMY WITH RADIOACTIVE SEED LOCALIZATION;  Surgeon: Excell Seltzer, MD;  Location: [facility];  Service: General;  Laterality: Right;  . COLONOSCOPY  2001   2007 sigmoidoscopy     Current Medications: Current Meds    Medication Sig  . acetaminophen (TYLENOL) 325 MG tablet Take 650 mg by mouth every 6 (six) hours as needed for mild pain.  Marland Kitchen amLODipine (NORVASC) 2.5 MG tablet Take 1 tablet (2.5 mg total) by mouth daily. Please keep upcoming appt with Dr. Tamala Julian for future refills.  Marland Kitchen anastrozole (ARIMIDEX) 1 MG tablet Take 1 tablet (1 mg total) by mouth daily.  Marland Kitchen atorvastatin (LIPITOR) 40 MG tablet Take 40 mg by mouth daily.  Marland Kitchen CALCIUM PO Take 1 tablet by mouth daily.  . Cholecalciferol (VITAMIN D3 PO) Take 1 tablet by mouth daily.  . Denosumab (PROLIA [place]) Take as directed once every 6 months.  . metoprolol succinate (TOPROL-XL) 25 MG 24 hr tablet Take 12.5 mg by mouth daily.  . mirtazapine (REMERON) 15 MG tablet Take 15 mg by mouth at bedtime.  Marland Kitchen warfarin (COUMADIN) 4 MG tablet TAKE AS DIRECTED BY COUMADIN CLINIC     Allergies:   Patient has no known allergies.   Social History   Socioeconomic History  . Marital status: Single    Spouse name: Not on file  . Number of children: Not on file  . Years of education: Not on file  . Highest education level: Not on file  Occupational History  . Not on file  Social Needs  . Financial resource strain: Not on  file  . Food insecurity:    Worry: Not on file    Inability: Not on file  . Transportation needs:    Medical: Not on file    Non-medical: Not on file  Tobacco Use  . Smoking status: Former Smoker    Last attempt to quit: 02/02/1984    Years since quitting: 33.8  . Smokeless tobacco: Never Used  Substance and Sexual Activity  . Alcohol use: Not on file  . Drug use: Not on file  . Sexual activity: Never  Lifestyle  . Physical activity:    Days per week: Not on file    Minutes per session: Not on file  . Stress: Not on file  Relationships  . Social connections:    Talks on phone: Not on file    Gets together: Not on file    Attends religious service: Not on file    Active member of club or organization: Not on file    Attends meetings of  clubs or organizations: Not on file    Relationship status: Not on file  Other Topics Concern  . Not on file  Social History Narrative  . Not on file     Family History: The patient's family history includes Breast cancer (age of onset: 26) in her sister; Diabetes in her mother; Liver cancer in her mother; Prostate cancer (age of onset: 14) in her brother. There is no history of Heart attack or Stroke.  ROS:   Please see the history of present illness.    Only complaint is shortness of breath at times with physical activity.  States that she exercises on a regular basis the shortness of breath is not as much of an issue.  All other systems reviewed and are negative.  EKGs/Labs/Other Studies Reviewed:    The following studies were reviewed today: No new data  EKG:  EKG is  ordered today.  The ekg ordered today demonstrates sinus bradycardia at 56 bpm.  Otherwise normal.  Biatrial abnormality noted.  PR interval 232 ms.  Recent Labs: 09/20/2017: ALT 14; BUN 14; Creatinine, Ser 1.04; Hemoglobin 12.9; Platelets 196; Potassium 3.8; Sodium 144  Recent Lipid Panel No results found for: CHOL, TRIG, HDL, CHOLHDL, VLDL, LDLCALC, LDLDIRECT  Physical Exam:    VS:  BP (!) 146/78   Pulse (!) 56   Ht 5\' 8"  (1.727 m)   Wt 151 lb (68.5 kg)   BMI 22.96 kg/m     Wt Readings from Last 3 Encounters:  11/22/17 151 lb (68.5 kg)  03/23/17 132 lb 11.2 oz (60.2 kg)  11/03/16 139 lb (63 kg)     GEN:  Well nourished, well developed in no acute distress HEENT: Normal NECK: No JVD. LYMPHATICS: No lymphadenopathy CARDIAC: RRR, 2/6 systolic murmur right upper sternal border and left mid sternal border consistent with aortic and possibly mitral murmur, S4 gallop, no edema. VASCULAR: 2+ bilateral radial and carotid pulses.  No bruits. RESPIRATORY:  Clear to auscultation without rales, wheezing or rhonchi  ABDOMEN: Soft, non-tender, non-distended, No pulsatile mass, MUSCULOSKELETAL: No deformity    SKIN: Warm and dry NEUROLOGIC:  Alert and oriented x 3 PSYCHIATRIC:  Normal affect   ASSESSMENT:    1. Paroxysmal atrial flutter ([place])   2. Chronic anticoagulation   3. Tachy-brady syndrome ([place])   4. Essential hypertension   5. Murmur    PLAN:    In order of problems listed above:  1. Currently in normal sinus rhythm/sinus bradycardia with findings  consistent with tachybradycardia syndrome.  No recent symptomatic episodes of atrial fibrillation or tachycardia. 2. Coumadin anticoagulation without bleeding. 3. See #1 above. 4. Borderline blood pressure control currently 140/78 mmHg.  No change in current therapy which includes Toprol-XL and Norvasc 2.5 mg/day. 5. Plan to perform a 2D Doppler echocardiogram to assess LV size, function, aortic and mitral valve with reference to the systolic murmur.  Suspect she has mild aortic stenosis at this point.  Doubt that is having a symptom causing effect but the study will help to be sure.  Overall she is doing well.  I encouraged aerobic activity.  She should call if chest discomfort or worsening dyspnea.  She should call if orthopnea.  Prolonged palpitations would also be another reason to notify us.   Medication Adjustments/Labs and Tests Ordered: Current medicines are reviewed at length with the patient today.  Concerns regarding medicines are outlined above.  Orders Placed This Encounter  Procedures  . EKG 12-Lead  . ECHOCARDIOGRAM COMPLETE   No orders of the defined types were placed in this encounter.   Patient Instructions  Medication Instructions:  Your physician recommends that you continue on your current medications as directed. Please refer to the Current Medication list given to you today.  If you need a refill on your cardiac medications before your next appointment, please call your pharmacy.   Lab work: None If you have labs (blood work) drawn today and your tests are completely normal, you will receive your results  only by: Marland Kitchen MyChart Message (if you have MyChart) OR . A paper copy in the mail If you have any lab test that is abnormal or we need to change your treatment, we will call you to review the results.  Testing/Procedures: Your physician has requested that you have an echocardiogram. Echocardiography is a painless test that uses sound waves to create images of your heart. It provides your doctor with information about the size and shape of your heart and how well your heart's chambers and valves are working. This procedure takes approximately one hour. There are no restrictions for this procedure.   Follow-Up: At [facility], you and your health needs are our priority.  As part of our continuing mission to provide you with exceptional heart care, we have created designated Provider Care Teams.  These Care Teams include your primary Cardiologist (physician) and Advanced Practice Providers (APPs -  Physician Assistants and Nurse Practitioners) who all work together to provide you with the care you need, when you need it. You will need a follow up appointment in 12 months.  Please call our office 2 months in advance to schedule this appointment.  You may see Dr. Tamala Julian or one of the following Advanced Practice Providers on your designated Care Team:   Truitt Merle, NP Cecilie Kicks, NP . Kathyrn Drown, NP  Any Other Special Instructions Will Be Listed Below (If Applicable).       Signed, Sinclair Grooms, MD  11/22/2017 10:50 AM    [place]

## 2017-11-22 ENCOUNTER — Ambulatory Visit (INDEPENDENT_AMBULATORY_CARE_PROVIDER_SITE_OTHER): Payer: Medicare Other | Admitting: Pharmacist

## 2017-11-22 ENCOUNTER — Ambulatory Visit (INDEPENDENT_AMBULATORY_CARE_PROVIDER_SITE_OTHER): Payer: Medicare Other | Admitting: Interventional Cardiology

## 2017-11-22 ENCOUNTER — Encounter: Payer: Self-pay | Admitting: Interventional Cardiology

## 2017-11-22 VITALS — BP 146/78 | HR 56 | Ht 68.0 in | Wt 151.0 lb

## 2017-11-22 DIAGNOSIS — Z7901 Long term (current) use of anticoagulants: Secondary | ICD-10-CM

## 2017-11-22 DIAGNOSIS — Z5181 Encounter for therapeutic drug level monitoring: Secondary | ICD-10-CM

## 2017-11-22 DIAGNOSIS — R011 Cardiac murmur, unspecified: Secondary | ICD-10-CM | POA: Diagnosis not present

## 2017-11-22 DIAGNOSIS — I4892 Unspecified atrial flutter: Secondary | ICD-10-CM | POA: Diagnosis not present

## 2017-11-22 DIAGNOSIS — I10 Essential (primary) hypertension: Secondary | ICD-10-CM

## 2017-11-22 DIAGNOSIS — I495 Sick sinus syndrome: Secondary | ICD-10-CM

## 2017-11-22 LAB — POCT INR: INR: 2.5 (ref 2.0–3.0)

## 2017-11-22 NOTE — Patient Instructions (Signed)
Description   Continue taking 1.5 tablets (6mg) everyday. Recheck INR in 6 weeks. Call if scheduled for any procedures [phone]      

## 2017-11-22 NOTE — Patient Instructions (Signed)
Medication Instructions:  Your physician recommends that you continue on your current medications as directed. Please refer to the Current Medication list given to you today.  If you need a refill on your cardiac medications before your next appointment, please call your pharmacy.   Lab work: None If you have labs (blood work) drawn today and your tests are completely normal, you will receive your results only by: Marland Kitchen MyChart Message (if you have MyChart) OR . A paper copy in the mail If you have any lab test that is abnormal or we need to change your treatment, we will call you to review the results.  Testing/Procedures: Your physician has requested that you have an echocardiogram. Echocardiography is a painless test that uses sound waves to create images of your heart. It provides your doctor with information about the size and shape of your heart and how well your heart's chambers and valves are working. This procedure takes approximately one hour. There are no restrictions for this procedure.   Follow-Up: At [facility], you and your health needs are our priority.  As part of our continuing mission to provide you with exceptional heart care, we have created designated Provider Care Teams.  These Care Teams include your primary Cardiologist (physician) and Advanced Practice Providers (APPs -  Physician Assistants and Nurse Practitioners) who all work together to provide you with the care you need, when you need it. You will need a follow up appointment in 12 months.  Please call our office 2 months in advance to schedule this appointment.  You may see Dr. Tamala Julian or one of the following Advanced Practice Providers on your designated Care Team:   Truitt Merle, NP Cecilie Kicks, NP . Kathyrn Drown, NP  Any Other Special Instructions Will Be Listed Below (If Applicable).

## 2017-11-29 ENCOUNTER — Ambulatory Visit (HOSPITAL_COMMUNITY): Payer: Medicare Other | Attending: Cardiovascular Disease

## 2017-11-29 ENCOUNTER — Other Ambulatory Visit: Payer: Self-pay

## 2017-11-29 DIAGNOSIS — R011 Cardiac murmur, unspecified: Secondary | ICD-10-CM

## 2017-12-15 ENCOUNTER — Other Ambulatory Visit: Payer: Self-pay | Admitting: Oncology

## 2017-12-15 DIAGNOSIS — Z853 Personal history of malignant neoplasm of breast: Secondary | ICD-10-CM

## 2017-12-17 ENCOUNTER — Other Ambulatory Visit: Payer: Self-pay | Admitting: Physician Assistant

## 2018-01-03 ENCOUNTER — Ambulatory Visit (INDEPENDENT_AMBULATORY_CARE_PROVIDER_SITE_OTHER): Payer: Medicare Other | Admitting: *Deleted

## 2018-01-03 DIAGNOSIS — Z5181 Encounter for therapeutic drug level monitoring: Secondary | ICD-10-CM

## 2018-01-03 LAB — POCT INR: INR: 2.5 (ref 2.0–3.0)

## 2018-01-03 NOTE — Patient Instructions (Signed)
Description   Continue taking 1.5 tablets (6mg ) everyday. Recheck INR in 6 weeks. Call if scheduled for any procedures [phone]

## 2018-01-23 ENCOUNTER — Ambulatory Visit
Admission: RE | Admit: 2018-01-23 | Discharge: 2018-01-23 | Disposition: A | Discharge status: Home / Self Care | Payer: Medicare Other | Source: Ambulatory Visit | Attending: Oncology | Admitting: Oncology

## 2018-01-23 DIAGNOSIS — Z853 Personal history of malignant neoplasm of breast: Secondary | ICD-10-CM

## 2018-01-23 DIAGNOSIS — R922 Inconclusive mammogram: Secondary | ICD-10-CM | POA: Diagnosis not present

## 2018-02-15 ENCOUNTER — Ambulatory Visit
Admission: RE | Admit: 2018-02-15 | Discharge: 2018-02-15 | Disposition: A | Discharge status: Home / Self Care | Payer: Medicare Other | Source: Ambulatory Visit | Attending: Oncology | Admitting: Oncology

## 2018-02-15 ENCOUNTER — Ambulatory Visit (INDEPENDENT_AMBULATORY_CARE_PROVIDER_SITE_OTHER): Payer: Medicare Other | Admitting: *Deleted

## 2018-02-15 DIAGNOSIS — Z5181 Encounter for therapeutic drug level monitoring: Secondary | ICD-10-CM | POA: Diagnosis not present

## 2018-02-15 DIAGNOSIS — M8580 Other specified disorders of bone density and structure, unspecified site: Secondary | ICD-10-CM

## 2018-02-15 DIAGNOSIS — C50211 Malignant neoplasm of upper-inner quadrant of right female breast: Secondary | ICD-10-CM

## 2018-02-15 DIAGNOSIS — Z780 Asymptomatic menopausal state: Secondary | ICD-10-CM | POA: Diagnosis not present

## 2018-02-15 DIAGNOSIS — M8589 Other specified disorders of bone density and structure, multiple sites: Secondary | ICD-10-CM | POA: Diagnosis not present

## 2018-02-15 DIAGNOSIS — Z170 Estrogen receptor positive status [ER+]: Principal | ICD-10-CM

## 2018-02-15 DIAGNOSIS — I495 Sick sinus syndrome: Secondary | ICD-10-CM

## 2018-02-15 DIAGNOSIS — I4892 Unspecified atrial flutter: Secondary | ICD-10-CM

## 2018-02-15 LAB — POCT INR: INR: 2.6 (ref 2.0–3.0)

## 2018-02-15 NOTE — Patient Instructions (Signed)
Description   Continue taking 1.5 tablets (6mg ) everyday. Recheck INR in 6 weeks. Call if scheduled for any procedures [phone]

## 2018-02-16 ENCOUNTER — Telehealth: Payer: Self-pay

## 2018-02-16 NOTE — Telephone Encounter (Signed)
-----   Message from Gardenia Phlegm, NP sent at 02/16/2018  2:17 PM EST ----- Bone density unchanged, continue with prolia.  Please let patient know.  Thanks.  [place] ----- Message ----- From: Interface, Rad Results In Sent: 02/15/2018   3:05 PM EST To: Chauncey Cruel, MD

## 2018-02-16 NOTE — Telephone Encounter (Signed)
-----   Message from Gardenia Phlegm, NP sent at 02/16/2018  2:17 PM EST ----- Bone density unchanged, continue with prolia.  Please let patient know.  Thanks.  [place] ----- Message ----- From: Interface, Rad Results In Sent: 02/15/2018   3:05 PM EST To: Chauncey Cruel, MD

## 2018-02-16 NOTE — Telephone Encounter (Signed)
Spoke to patient informing of BD results and recommendation to stay on Prolia.  Patient voiced understanding and had no questions/concerns at this time.

## 2018-02-16 NOTE — Telephone Encounter (Signed)
Attempted to contact patient on both numbers in chart to inform of BD results.  Unable to leave message.

## 2018-02-28 ENCOUNTER — Other Ambulatory Visit: Payer: Self-pay | Admitting: Interventional Cardiology

## 2018-03-22 NOTE — Progress Notes (Signed)
[place]  Telephone:(336) [phone] Fax:(336) [phone]    ID: Martha Edwards DOB: 06/05/1932  MR#: [id number]  FSE#:[id number]  Patient Care Team: [name], MD as PCP - General (Family Medicine) Belva Crome, MD as PCP - Cardiology (Cardiology) Excell Seltzer, MD as Consulting Physician (General Surgery) Fumie Fiallo, Virgie Dad, MD as Consulting Physician (Oncology) Eppie Gibson, MD as Attending Physician (Radiation Oncology) OTHER MD:   CHIEF COMPLAINT: Estrogen receptor positive breast cancer  CURRENT TREATMENT: Anastrozole; denosumab/Prolia   BREAST CANCER HISTORY: From the original intake note:  The patient had routine screening mammography at the [facility] 01/20/2015 showing a possible mass in the right breast. On 02/19/2015 she returned for right diagnostic mammography with tomosynthesis and right breast ultrasonography. The breast density was category B. In the upper inner quadrant of the right breast there was a 1.3 cm irregular mass which was palpable by exam. Ultrasonography measured this at 1.3 cm maximally. Ultrasound of the right axilla was unremarkable.  Biopsy of this mass 02/27/2015 showed ([id number]) an invasive ductal carcinoma, grade 1 or 2, 100% estrogen receptor positive, with strong staining intensity; 90% progesterone receptor positive with moderate staining intensity, with an MIB-1 of 10%, and no HER-2 implication, the signals ratio 1.15 and the number per cell 1.95.  The case was discussed at breast cancer conference and it was decided sentinel lymph node sampling and Oncotype would not be needed. The patient proceeded to right lumpectomy 04/07/2015, with the final pathology ([id number]) showing an invasive ductal carcinoma, grade 1, measuring 1.3 cm. Margins were negative. Repeat HER-2 was again negative, with a signals ratio of 1.43, the number per cell being 2.00.  The patient met with radiation oncology 05/16/2015. At that time  the possibility of adjuvant radiation versus anti-estrogens only was discussed. The patient was supposed to have seen me after that visit but she did not show. She subsequently called and said that she would like "pill" rather than radiation and was started on anastrozole April 2017.  Her subsequent history is as detailed below   INTERVAL HISTORY: Martha Edwards returns today for follow-up and treatment of her estrogen receptor positive breast cancer.   She continues on anastrozole. She tolerates this well and without any noticeable side effects. She pays a good price for her medication.   She also receives Prolia/denosumab, with a dose due today. She tolerates this well and without any noticeable side effects.    Martha Edwards's last bone density screening on 02/15/2018, showed a T-score of -2.4, which is considered osteopenic.  This is unchanged as compared to her prior bone density of 01/20/2016.  Martha Edwards's last echocardiogram on 11/29/2017, showed an ejection fraction in the 60% - 65% range.   Since her last visit here, she underwent a digital diagnostic bilateral mammogram with tomography on 01/23/2018 showing: Breast Density Category C. There is no mammographic evidence for malignancy.     REVIEW OF SYSTEMS: [name] has been going to the [facility] twice a week for exercise, mostly walking. She still does all of her shopping and cleaning as usual, however, she does feel "a bit more lazy than normal." She has not noticed any changes in her body that she is concerned about. The patient denies unusual headaches, visual changes, nausea, vomiting, or dizziness. There has been no unusual cough, phlegm production, or pleurisy. This been no change in bowel or bladder habits. The patient denies unexplained fatigue or unexplained weight loss, bleeding, rash, or fever. A detailed review of systems was  otherwise noncontributory.    PAST MEDICAL HISTORY: Past Medical History:  Diagnosis Date  . Anticoagulated   . Anxiety     . Arthritis of hand   . Atrial flutter ([place])    with LVEF 60%, LVH, LAE and holter with rate control and Ave HR 60 bpm   . Carotid artery plaque    mild bilateral carotid plaque without significant stenosis (<20% in left ICA), 04/2010  . CKD (chronic kidney disease) stage 2, GFR 60-89 ml/min   . Diverticula, colon   . Family history of breast cancer   . Family history of prostate cancer   . Hypercholesterolemia   . Hypertension    with LVH on echo 06/03/11  . Hypertonicity of bladder   . Insomnia   . Mitral regurgitation   . Osteopenia    > osteoporosis  . Overactive bladder     PAST SURGICAL HISTORY: Past Surgical History:  Procedure Laterality Date  . BREAST BIOPSY    . BREAST LUMPECTOMY Right    2017  . BREAST LUMPECTOMY WITH RADIOACTIVE SEED LOCALIZATION Right 04/07/2015   Procedure: RIGHT BREAST LUMPECTOMY WITH RADIOACTIVE SEED LOCALIZATION;  Surgeon: Excell Seltzer, MD;  Location: [facility];  Service: General;  Laterality: Right;  . COLONOSCOPY  2001   2007 sigmoidoscopy     FAMILY HISTORY: Family History  Problem Relation Age of Onset  . Diabetes Mother   . Liver cancer Mother   . Breast cancer Sister 39  . Prostate cancer Brother 15       metastatic  . Heart attack Neg Hx   . Stroke Neg Hx    Patient's father died in his 35s from causes unknown to the patient. The patient's mother died at the age of 12 with what may have been breast cancer. The patient had 2 brothers one sister. One sister was diagnosed with breast cancer in her early 52s.   GYNECOLOGIC HISTORY:  No LMP recorded. Patient is postmenopausal. Menarche age 43, first live birth age 31, the patient is [place] P3. She does not recall when she went through menopause. She never took hormone replacement or oral contraceptives   SOCIAL HISTORY:  The patient worked at [employer] for many years. She is now retired. She is widowed, lives by herself, with no pets. Her daughter Martha Edwards  is an Optometrist for the [employer] system. Son Martha Edwards lives in [place] and is disabled. Son Martha Edwards lives in [place] and works for [employer]. The patient has 4 grandchildren and 2 great-grandchildren.   ADVANCED DIRECTIVES: The patient tells me her daughter Martha Hauser is her healthcare part of attorney. She can be reached at [place]: Social History   Tobacco Use  . Smoking status: Former Smoker    Last attempt to quit: 02/02/1984    Years since quitting: 34.1  . Smokeless tobacco: Never Used  Substance Use Topics  . Alcohol use: Not on file  . Drug use: Not on file    Colonoscopy:  PAP:  Bone density:   No Known Allergies  Current Outpatient Medications  Medication Sig Dispense Refill  . acetaminophen (TYLENOL) 325 MG tablet Take 650 mg by mouth every 6 (six) hours as needed for mild pain.    Marland Kitchen amLODipine (NORVASC) 2.5 MG tablet Take 1 tablet (2.5 mg total) by mouth daily. 30 tablet 10  . anastrozole (ARIMIDEX) 1 MG tablet Take 1 tablet (1 mg total) by mouth daily. 30 tablet 12  .  atorvastatin (LIPITOR) 40 MG tablet Take 40 mg by mouth daily.    Marland Kitchen CALCIUM PO Take 1 tablet by mouth daily.    . Cholecalciferol (VITAMIN D3 PO) Take 1 tablet by mouth daily.    . Denosumab (PROLIA Shepherd) Take as directed once every 6 months.    . metoprolol succinate (TOPROL-XL) 25 MG 24 hr tablet Take 12.5 mg by mouth daily.    . mirtazapine (REMERON) 15 MG tablet Take 15 mg by mouth at bedtime.    Marland Kitchen warfarin (COUMADIN) 4 MG tablet TAKE AS DIRECTED BY COUMADIN CLINIC 135 tablet 1   No current facility-administered medications for this visit.     OBJECTIVE: Older African-American woman who appears stated age  85:   03/23/18 0958  BP: (!) 169/95  Pulse: 69  Resp: 18  Temp: (!) 97.2 F (36.2 C)  SpO2: 96%     Body mass index is 24.05 kg/m.    ECOG FS:0 - Asymptomatic  Sclerae unicteric, bilateral arcus senilis No cervical or supraclavicular  adenopathy Lungs no rales or rhonchi Heart regular rate and rhythm Abd soft, nontender, positive bowel sounds MSK no focal spinal tenderness, no upper extremity lymphedema Neuro: nonfocal, well oriented, appropriate affect Breasts: The right breast has undergone lumpectomy.  There are no suspicious findings.  The left breast is benign.  Both axillae are benign.  LAB RESULTS:  CMP     Component Value Date/Time   NA 145 03/23/2018 0950   NA 142 10/21/2016 1334   K 4.1 03/23/2018 0950   K 4.2 10/21/2016 1334   CL 109 03/23/2018 0950   CO2 22 03/23/2018 0950   CO2 25 10/21/2016 1334   GLUCOSE 82 03/23/2018 0950   GLUCOSE 85 10/21/2016 1334   BUN 14 03/23/2018 0950   BUN 11.5 10/21/2016 1334   CREATININE 1.11 (H) 03/23/2018 0950   CREATININE 0.9 10/21/2016 1334   CALCIUM 9.8 03/23/2018 0950   CALCIUM 9.9 10/21/2016 1334   PROT 7.7 03/23/2018 0950   PROT 7.2 10/21/2016 1334   ALBUMIN 4.3 03/23/2018 0950   ALBUMIN 4.1 10/21/2016 1334   AST 24 03/23/2018 0950   AST 19 10/21/2016 1334   ALT 13 03/23/2018 0950   ALT 10 10/21/2016 1334   ALKPHOS 52 03/23/2018 0950   ALKPHOS 49 10/21/2016 1334   BILITOT 0.6 03/23/2018 0950   BILITOT 0.76 10/21/2016 1334   GFRNONAA 46 (L) 03/23/2018 0950   GFRAA 53 (L) 03/23/2018 0950    INo results found for: SPEP, UPEP  Lab Results  Component Value Date   WBC 7.8 03/23/2018   NEUTROABS 4.4 03/23/2018   HGB 13.9 03/23/2018   HCT 43.9 03/23/2018   MCV 89.8 03/23/2018   PLT 209 03/23/2018      Chemistry      Component Value Date/Time   NA 145 03/23/2018 0950   NA 142 10/21/2016 1334   K 4.1 03/23/2018 0950   K 4.2 10/21/2016 1334   CL 109 03/23/2018 0950   CO2 22 03/23/2018 0950   CO2 25 10/21/2016 1334   BUN 14 03/23/2018 0950   BUN 11.5 10/21/2016 1334   CREATININE 1.11 (H) 03/23/2018 0950   CREATININE 0.9 10/21/2016 1334      Component Value Date/Time   CALCIUM 9.8 03/23/2018 0950   CALCIUM 9.9 10/21/2016 1334   ALKPHOS  52 03/23/2018 0950   ALKPHOS 49 10/21/2016 1334   AST 24 03/23/2018 0950   AST 19 10/21/2016 1334   ALT 13 03/23/2018  0950   ALT 10 10/21/2016 1334   BILITOT 0.6 03/23/2018 0950   BILITOT 0.76 10/21/2016 1334       No results found for: LABCA2  No components found for: LABCA125  No results for input(s): INR in the last 168 hours.  Urinalysis No results found for: COLORURINE, APPEARANCEUR, LABSPEC, PHURINE, GLUCOSEU, HGBUR, BILIRUBINUR, KETONESUR, PROTEINUR, UROBILINOGEN, NITRITE, LEUKOCYTESUR   STUDIES: Mammography results discussed with the patient  ELIGIBLE FOR AVAILABLE RESEARCH PROTOCOL: no   ASSESSMENT: 85 y.o. [place] woman status post right breast upper inner quadrant lumpectomy 04/07/2015 for a pT1c cN0, stage IA invasive ductal carcinoma, grade 1, estrogen and progesterone receptor positive, HER-2 nonamplified, with an MIB-1 of 10%.  (1) the patient opted against adjuvant radiation   (2) started anastrozole April 2017    (3) may qualify for genetics counseling  (4) severe osteopenia, denosumab/Prolia started 05/06/2016  (a) bone density 01/20/2016 showed a T score of -2.4 (no change)   PLAN: Shaira is now just about 3 years out from definitive surgery with no evidence of disease recurrence.  This is very favorable.  She is tolerating the anastrozole with no side effects she is aware of and the plan is to continue that a total of 5 years.  Her bone density is stable.  We are going to continue Prolia for an additional 2 years.  [facility].  I have encouraged her to continue to be as active as possible  She will return in 6 and 12 months for labs, visit, and her Prolia.  She knows to call for any other issues that may develop before the next visit.    Brodie Scovell, Virgie Dad, MD  03/23/18 10:32 AM Medical Oncology and Hematology [facility] [address] [place], [place] [address] Tel.  [phone]    Fax. [phone]  I, Jacqualyn Posey am acting as a Education administrator for Chauncey Cruel, MD.   I, Lurline Del MD, have reviewed the above documentation for accuracy and completeness, and I agree with the above.

## 2018-03-23 ENCOUNTER — Inpatient Hospital Stay: Payer: Medicare Other

## 2018-03-23 ENCOUNTER — Inpatient Hospital Stay: Payer: Medicare Other | Attending: Oncology

## 2018-03-23 ENCOUNTER — Telehealth: Payer: Self-pay

## 2018-03-23 ENCOUNTER — Inpatient Hospital Stay (HOSPITAL_BASED_OUTPATIENT_CLINIC_OR_DEPARTMENT_OTHER): Payer: Medicare Other | Admitting: Oncology

## 2018-03-23 ENCOUNTER — Telehealth: Payer: Self-pay | Admitting: Oncology

## 2018-03-23 ENCOUNTER — Other Ambulatory Visit: Payer: Self-pay | Admitting: Oncology

## 2018-03-23 VITALS — BP 169/95 | HR 69 | Temp 97.2°F | Resp 18 | Ht 68.0 in | Wt 158.2 lb

## 2018-03-23 DIAGNOSIS — Z7901 Long term (current) use of anticoagulants: Secondary | ICD-10-CM | POA: Insufficient documentation

## 2018-03-23 DIAGNOSIS — Z79899 Other long term (current) drug therapy: Secondary | ICD-10-CM

## 2018-03-23 DIAGNOSIS — I4892 Unspecified atrial flutter: Secondary | ICD-10-CM | POA: Insufficient documentation

## 2018-03-23 DIAGNOSIS — I10 Essential (primary) hypertension: Secondary | ICD-10-CM

## 2018-03-23 DIAGNOSIS — M8580 Other specified disorders of bone density and structure, unspecified site: Secondary | ICD-10-CM

## 2018-03-23 DIAGNOSIS — C50211 Malignant neoplasm of upper-inner quadrant of right female breast: Secondary | ICD-10-CM | POA: Insufficient documentation

## 2018-03-23 DIAGNOSIS — Z79811 Long term (current) use of aromatase inhibitors: Secondary | ICD-10-CM | POA: Insufficient documentation

## 2018-03-23 DIAGNOSIS — Z87891 Personal history of nicotine dependence: Secondary | ICD-10-CM

## 2018-03-23 DIAGNOSIS — Z170 Estrogen receptor positive status [ER+]: Secondary | ICD-10-CM

## 2018-03-23 LAB — CBC WITH DIFFERENTIAL/PLATELET
ABS IMMATURE GRANULOCYTES: 0.02 10*3/uL (ref 0.00–0.07)
BASOS PCT: 0 %
Basophils Absolute: 0 10*3/uL (ref 0.0–0.1)
Eosinophils Absolute: 0.2 10*3/uL (ref 0.0–0.5)
Eosinophils Relative: 2 %
HCT: 43.9 % (ref 36.0–46.0)
Hemoglobin: 13.9 g/dL (ref 12.0–15.0)
IMMATURE GRANULOCYTES: 0 %
LYMPHS PCT: 34 %
Lymphs Abs: 2.7 10*3/uL (ref 0.7–4.0)
MCH: 28.4 pg (ref 26.0–34.0)
MCHC: 31.7 g/dL (ref 30.0–36.0)
MCV: 89.8 fL (ref 80.0–100.0)
MONOS PCT: 7 %
Monocytes Absolute: 0.6 10*3/uL (ref 0.1–1.0)
NEUTROS ABS: 4.4 10*3/uL (ref 1.7–7.7)
NEUTROS PCT: 57 %
PLATELETS: 209 10*3/uL (ref 150–400)
RBC: 4.89 MIL/uL (ref 3.87–5.11)
RDW: 14.9 % (ref 11.5–15.5)
WBC: 7.8 10*3/uL (ref 4.0–10.5)
nRBC: 0 % (ref 0.0–0.2)

## 2018-03-23 LAB — COMPREHENSIVE METABOLIC PANEL
ALT: 13 U/L (ref 0–44)
AST: 24 U/L (ref 15–41)
Albumin: 4.3 g/dL (ref 3.5–5.0)
Alkaline Phosphatase: 52 U/L (ref 38–126)
Anion gap: 14 (ref 5–15)
BILIRUBIN TOTAL: 0.6 mg/dL (ref 0.3–1.2)
BUN: 14 mg/dL (ref 8–23)
CO2: 22 mmol/L (ref 22–32)
Calcium: 9.8 mg/dL (ref 8.9–10.3)
Chloride: 109 mmol/L (ref 98–111)
Creatinine, Ser: 1.11 mg/dL — ABNORMAL HIGH (ref 0.44–1.00)
GFR calc Af Amer: 53 mL/min — ABNORMAL LOW (ref >60)
GFR calc non Af Amer: 46 mL/min — ABNORMAL LOW (ref >60)
GLUCOSE: 82 mg/dL (ref 70–99)
Potassium: 4.1 mmol/L (ref 3.5–5.1)
Sodium: 145 mmol/L (ref 135–145)
TOTAL PROTEIN: 7.7 g/dL (ref 6.5–8.1)

## 2018-03-23 MED ORDER — DENOSUMAB 60 MG/ML ~~LOC~~ SOLN: 60.0000 mg | Freq: Once | SUBCUTANEOUS | Status: DC

## 2018-03-23 MED ORDER — DENOSUMAB 60 MG/ML ~~LOC~~ SOSY
PREFILLED_SYRINGE | SUBCUTANEOUS | Status: AC
  Filled 2018-03-23: qty 1

## 2018-03-23 NOTE — Telephone Encounter (Signed)
Scheduled appt per 2/20 sch message - pt is aware of appt date and time   

## 2018-03-23 NOTE — Telephone Encounter (Signed)
Gave avs and calendar ° °

## 2018-03-23 NOTE — Progress Notes (Signed)
Pt could not be located within [facility]. Called home and cell no answer. Sent message to scheduling to reschedule pt for injection. Dr. Jana Hakim desk nurse made aware. Mykell Genao LPN

## 2018-03-23 NOTE — Telephone Encounter (Signed)
-----   Message from Gardenia Phlegm, NP sent at 03/23/2018  1:56 PM EST ----- Please fax to pcp ----- Message ----- From: Buel Ream, Lab In [place] Sent: 03/23/2018   9:58 AM EST To: Chauncey Cruel, MD

## 2018-03-23 NOTE — Telephone Encounter (Signed)
Patient's labs faxed to Dr. Stephanie Acre MD

## 2018-03-23 NOTE — Addendum Note (Signed)
Addended by: Chauncey Cruel on: 03/23/2018 10:48 AM   Modules accepted: Orders

## 2018-03-27 ENCOUNTER — Inpatient Hospital Stay: Payer: Medicare Other

## 2018-03-27 VITALS — BP 154/91 | HR 63 | Temp 97.9°F | Resp 18

## 2018-03-27 DIAGNOSIS — C50211 Malignant neoplasm of upper-inner quadrant of right female breast: Secondary | ICD-10-CM

## 2018-03-27 DIAGNOSIS — Z170 Estrogen receptor positive status [ER+]: Secondary | ICD-10-CM | POA: Diagnosis not present

## 2018-03-27 DIAGNOSIS — M8580 Other specified disorders of bone density and structure, unspecified site: Secondary | ICD-10-CM | POA: Diagnosis not present

## 2018-03-27 DIAGNOSIS — Z79811 Long term (current) use of aromatase inhibitors: Secondary | ICD-10-CM | POA: Diagnosis not present

## 2018-03-27 DIAGNOSIS — I10 Essential (primary) hypertension: Secondary | ICD-10-CM | POA: Diagnosis not present

## 2018-03-27 DIAGNOSIS — I4892 Unspecified atrial flutter: Secondary | ICD-10-CM | POA: Diagnosis not present

## 2018-03-27 MED ORDER — DENOSUMAB 60 MG/ML ~~LOC~~ SOSY
PREFILLED_SYRINGE | SUBCUTANEOUS | Status: AC
  Filled 2018-03-27: qty 1

## 2018-03-27 MED ORDER — DENOSUMAB 60 MG/ML ~~LOC~~ SOLN
60.0000 mg | Freq: Once | SUBCUTANEOUS | Status: AC
  Administered 2018-03-27: 60 mg via SUBCUTANEOUS

## 2018-03-29 ENCOUNTER — Ambulatory Visit (INDEPENDENT_AMBULATORY_CARE_PROVIDER_SITE_OTHER): Payer: Medicare Other | Admitting: Pharmacist

## 2018-03-29 DIAGNOSIS — Z5181 Encounter for therapeutic drug level monitoring: Secondary | ICD-10-CM

## 2018-03-29 LAB — POCT INR: INR: 3.7 — AB (ref 2.0–3.0)

## 2018-03-29 NOTE — Patient Instructions (Signed)
Description   Skip your Coumadin today, then continue taking 1.5 tablets (6mg ) every day. Recheck INR in 4 weeks. Call if scheduled for any procedures [phone]

## 2018-04-25 ENCOUNTER — Telehealth: Payer: Self-pay

## 2018-04-25 NOTE — Telephone Encounter (Signed)

## 2018-04-25 NOTE — Telephone Encounter (Signed)
[facility] FOR SCREEN AND DRIVE THRU

## 2018-04-27 ENCOUNTER — Other Ambulatory Visit: Payer: Self-pay

## 2018-04-27 ENCOUNTER — Ambulatory Visit (INDEPENDENT_AMBULATORY_CARE_PROVIDER_SITE_OTHER): Payer: Medicare Other | Admitting: Pharmacist

## 2018-04-27 DIAGNOSIS — Z5181 Encounter for therapeutic drug level monitoring: Secondary | ICD-10-CM

## 2018-04-27 LAB — POCT INR: INR: 2.5 (ref 2.0–3.0)

## 2018-04-27 MED ORDER — APIXABAN 5 MG PO TABS: 5.0000 mg | ORAL_TABLET | Freq: Two times a day (BID) | ORAL | 5 refills | Status: DC

## 2018-05-21 ENCOUNTER — Other Ambulatory Visit: Payer: Self-pay | Admitting: Oncology

## 2018-05-31 ENCOUNTER — Telehealth: Payer: Self-pay | Admitting: Interventional Cardiology

## 2018-05-31 NOTE — Telephone Encounter (Signed)
Patient has Pharmacist, community and does qualify for the $10/month copay card. I activated the free 30 day card for pt and mailed her the follow up $10/month card to bring to the pharmacy. She was switched from warfarin to Eliquis at last appt to help decrease office visits and exposure during [place].  Called pt who states when she tried to activate her Eliquis copay card, she was told she did not qualify. This is not correct, and I had also already activated her card for her before mailing it out. Advised pt to bring her copay card to the pharmacy so they can process the $10 rx. Also advised her to contact [employer] because they should be able to reimburse her for the $200 she spent last month. She confirmed she did pick up her first month free, and wishes to remain on Eliquis if the cost is $10/month. Provided her with my direct number so she can call me if she has any other problems filling her prescription.

## 2018-05-31 NOTE — Telephone Encounter (Signed)
Pt calling today to discuss the cost of Eliquis. She states her last months supply cost more than $200 and she cannot afford this. She has not yet used her 30 day free card and does not qualify for the $10 copay. She also does not know if she is currently in her doughnut hole or met her deductible.   She would like to know if she can go back on coumadin. She is not sure why she was taken off coumadin. She states she likes it better and she feels like she can manage it well. She also states it is more affordable.   I advised her I would send her message to Dr Tamala Julian and his nurse to review and follow up with her.

## 2018-05-31 NOTE — Telephone Encounter (Signed)
Patient states Eliquis is too expensive for her she is wondering if she can go back to warfarin, she was on it before.

## 2018-06-19 ENCOUNTER — Telehealth: Payer: Self-pay | Admitting: Interventional Cardiology

## 2018-06-19 NOTE — Telephone Encounter (Signed)
New Message   Pt c/o medication issue:  1. Name of Medication: Eliquis   2. How are you currently taking this medication (dosage and times per day)? 5mg  1 tablet by mouth twice a day  3. Are you having a reaction (difficulty breathing--STAT)? NO  4. What is your medication issue? Patient states the Eliquis is too expensive for her to keep purchasing and it also causes SOB when she takes it.  Patient wants to know if she could switch back to Warfarin 4mg .

## 2018-06-19 NOTE — Telephone Encounter (Signed)
Discussed with patient and was told could not use copay card. Offered to call pharmacy as pt has [employer], but pt declined as she wants to go back on warfarin. She understands benefits of Eliquis and need for close monitoring with restarting.   Spoke with patient about transition. She will restart with her previous dosage of 1.5 tablets (6mg ) daily (she has a supply in home but will need refill soon). She will overlap with Eliquis for three days (starting on 5/21). She repeats instructions back to me and is scheduled for INR on 5/27.

## 2018-06-27 ENCOUNTER — Telehealth: Payer: Self-pay

## 2018-06-27 NOTE — Telephone Encounter (Signed)

## 2018-06-28 ENCOUNTER — Other Ambulatory Visit: Payer: Self-pay

## 2018-06-28 ENCOUNTER — Ambulatory Visit (INDEPENDENT_AMBULATORY_CARE_PROVIDER_SITE_OTHER): Payer: Medicare Other | Admitting: *Deleted

## 2018-06-28 DIAGNOSIS — Z5181 Encounter for therapeutic drug level monitoring: Secondary | ICD-10-CM | POA: Diagnosis not present

## 2018-06-28 DIAGNOSIS — I4892 Unspecified atrial flutter: Secondary | ICD-10-CM

## 2018-06-28 LAB — POCT INR: INR: 1.7 — AB (ref 2.0–3.0)

## 2018-06-28 NOTE — Patient Instructions (Addendum)
Description   Spoke with pt and instructed pt to take 2 tablets (8mg ) today and then resume taking 1.5 tablets(6mg ) daily (this is pt's previous dose before trying Eliquis). Recheck INR in 1 week. Coumadin Clinic [phone]

## 2018-07-03 ENCOUNTER — Telehealth: Payer: Self-pay

## 2018-07-03 DIAGNOSIS — F322 Major depressive disorder, single episode, severe without psychotic features: Secondary | ICD-10-CM | POA: Diagnosis not present

## 2018-07-03 DIAGNOSIS — Z79899 Other long term (current) drug therapy: Secondary | ICD-10-CM | POA: Diagnosis not present

## 2018-07-03 DIAGNOSIS — E785 Hyperlipidemia, unspecified: Secondary | ICD-10-CM | POA: Diagnosis not present

## 2018-07-03 DIAGNOSIS — M810 Age-related osteoporosis without current pathological fracture: Secondary | ICD-10-CM | POA: Diagnosis not present

## 2018-07-03 DIAGNOSIS — I779 Disorder of arteries and arterioles, unspecified: Secondary | ICD-10-CM | POA: Diagnosis not present

## 2018-07-03 DIAGNOSIS — I059 Rheumatic mitral valve disease, unspecified: Secondary | ICD-10-CM | POA: Diagnosis not present

## 2018-07-03 DIAGNOSIS — I272 Pulmonary hypertension, unspecified: Secondary | ICD-10-CM | POA: Diagnosis not present

## 2018-07-03 DIAGNOSIS — I4891 Unspecified atrial fibrillation: Secondary | ICD-10-CM | POA: Diagnosis not present

## 2018-07-03 DIAGNOSIS — I10 Essential (primary) hypertension: Secondary | ICD-10-CM | POA: Diagnosis not present

## 2018-07-03 DIAGNOSIS — C50919 Malignant neoplasm of unspecified site of unspecified female breast: Secondary | ICD-10-CM | POA: Diagnosis not present

## 2018-07-03 DIAGNOSIS — D692 Other nonthrombocytopenic purpura: Secondary | ICD-10-CM | POA: Diagnosis not present

## 2018-07-03 DIAGNOSIS — Z0000 Encounter for general adult medical examination without abnormal findings: Secondary | ICD-10-CM | POA: Diagnosis not present

## 2018-07-03 NOTE — Telephone Encounter (Signed)

## 2018-07-06 ENCOUNTER — Ambulatory Visit (INDEPENDENT_AMBULATORY_CARE_PROVIDER_SITE_OTHER): Payer: Medicare Other

## 2018-07-06 ENCOUNTER — Other Ambulatory Visit: Payer: Self-pay

## 2018-07-06 DIAGNOSIS — I4892 Unspecified atrial flutter: Secondary | ICD-10-CM | POA: Diagnosis not present

## 2018-07-06 DIAGNOSIS — Z5181 Encounter for therapeutic drug level monitoring: Secondary | ICD-10-CM

## 2018-07-06 DIAGNOSIS — Z7901 Long term (current) use of anticoagulants: Secondary | ICD-10-CM

## 2018-07-06 LAB — POCT INR: INR: 2.7 (ref 2.0–3.0)

## 2018-07-06 NOTE — Patient Instructions (Signed)
Description   Continue on same dosage 1.5 tablets (6mg ) daily. Recheck INR in 4 week. Coumadin Clinic [phone]

## 2018-07-10 DIAGNOSIS — H40051 Ocular hypertension, right eye: Secondary | ICD-10-CM | POA: Diagnosis not present

## 2018-07-10 DIAGNOSIS — H349 Unspecified retinal vascular occlusion: Secondary | ICD-10-CM | POA: Diagnosis not present

## 2018-07-10 DIAGNOSIS — H5202 Hypermetropia, left eye: Secondary | ICD-10-CM | POA: Diagnosis not present

## 2018-07-10 DIAGNOSIS — H26491 Other secondary cataract, right eye: Secondary | ICD-10-CM | POA: Diagnosis not present

## 2018-07-13 DIAGNOSIS — C50911 Malignant neoplasm of unspecified site of right female breast: Secondary | ICD-10-CM | POA: Diagnosis not present

## 2018-07-21 ENCOUNTER — Other Ambulatory Visit: Payer: Self-pay | Admitting: Interventional Cardiology

## 2018-07-26 ENCOUNTER — Telehealth: Payer: Self-pay

## 2018-07-26 NOTE — Telephone Encounter (Signed)

## 2018-08-02 ENCOUNTER — Ambulatory Visit (INDEPENDENT_AMBULATORY_CARE_PROVIDER_SITE_OTHER): Payer: Medicare Other | Admitting: *Deleted

## 2018-08-02 ENCOUNTER — Other Ambulatory Visit: Payer: Self-pay

## 2018-08-02 DIAGNOSIS — Z5181 Encounter for therapeutic drug level monitoring: Secondary | ICD-10-CM

## 2018-08-02 LAB — POCT INR: INR: 3 (ref 2.0–3.0)

## 2018-08-02 NOTE — Patient Instructions (Signed)
Description   Continue on same dosage 1.5 tablets (6mg ) daily. Recheck INR in 4 week. Coumadin Clinic [phone]

## 2018-08-29 ENCOUNTER — Telehealth: Payer: Self-pay | Admitting: Pharmacist

## 2018-08-29 NOTE — Telephone Encounter (Signed)

## 2018-08-30 ENCOUNTER — Other Ambulatory Visit: Payer: Self-pay

## 2018-08-30 ENCOUNTER — Ambulatory Visit (INDEPENDENT_AMBULATORY_CARE_PROVIDER_SITE_OTHER): Payer: Medicare Other | Admitting: *Deleted

## 2018-08-30 DIAGNOSIS — Z5181 Encounter for therapeutic drug level monitoring: Secondary | ICD-10-CM

## 2018-08-30 LAB — POCT INR: INR: 2.6 (ref 2.0–3.0)

## 2018-08-30 NOTE — Patient Instructions (Signed)
Description   Continue on same dosage 1.5 tablets (6mg ) daily. Recheck INR in 6 weeks. Coumadin Clinic [phone]

## 2018-09-20 ENCOUNTER — Telehealth: Payer: Self-pay

## 2018-09-21 ENCOUNTER — Other Ambulatory Visit: Payer: Self-pay

## 2018-09-21 ENCOUNTER — Inpatient Hospital Stay: Payer: Medicare Other

## 2018-09-21 ENCOUNTER — Inpatient Hospital Stay (HOSPITAL_BASED_OUTPATIENT_CLINIC_OR_DEPARTMENT_OTHER): Payer: Medicare Other | Admitting: Adult Health

## 2018-09-21 ENCOUNTER — Ambulatory Visit: Payer: Medicare Other

## 2018-09-21 ENCOUNTER — Inpatient Hospital Stay: Payer: Medicare Other | Attending: Adult Health

## 2018-09-21 ENCOUNTER — Encounter: Payer: Self-pay | Admitting: Adult Health

## 2018-09-21 VITALS — BP 155/61 | HR 54 | Temp 99.1°F | Resp 18 | Wt 159.5 lb

## 2018-09-21 DIAGNOSIS — Z79899 Other long term (current) drug therapy: Secondary | ICD-10-CM | POA: Diagnosis not present

## 2018-09-21 DIAGNOSIS — Z79811 Long term (current) use of aromatase inhibitors: Secondary | ICD-10-CM | POA: Insufficient documentation

## 2018-09-21 DIAGNOSIS — Z7901 Long term (current) use of anticoagulants: Secondary | ICD-10-CM | POA: Diagnosis not present

## 2018-09-21 DIAGNOSIS — Z803 Family history of malignant neoplasm of breast: Secondary | ICD-10-CM | POA: Insufficient documentation

## 2018-09-21 DIAGNOSIS — Z87891 Personal history of nicotine dependence: Secondary | ICD-10-CM | POA: Insufficient documentation

## 2018-09-21 DIAGNOSIS — M8580 Other specified disorders of bone density and structure, unspecified site: Secondary | ICD-10-CM | POA: Diagnosis not present

## 2018-09-21 DIAGNOSIS — F419 Anxiety disorder, unspecified: Secondary | ICD-10-CM | POA: Insufficient documentation

## 2018-09-21 DIAGNOSIS — Z170 Estrogen receptor positive status [ER+]: Secondary | ICD-10-CM

## 2018-09-21 DIAGNOSIS — N182 Chronic kidney disease, stage 2 (mild): Secondary | ICD-10-CM | POA: Insufficient documentation

## 2018-09-21 DIAGNOSIS — C50211 Malignant neoplasm of upper-inner quadrant of right female breast: Secondary | ICD-10-CM

## 2018-09-21 DIAGNOSIS — I129 Hypertensive chronic kidney disease with stage 1 through stage 4 chronic kidney disease, or unspecified chronic kidney disease: Secondary | ICD-10-CM | POA: Diagnosis not present

## 2018-09-21 LAB — CBC WITH DIFFERENTIAL/PLATELET
Abs Immature Granulocytes: 0.02 10*3/uL (ref 0.00–0.07)
Basophils Absolute: 0 10*3/uL (ref 0.0–0.1)
Basophils Relative: 0 %
Eosinophils Absolute: 0.2 10*3/uL (ref 0.0–0.5)
Eosinophils Relative: 2 %
HCT: 40.7 % (ref 36.0–46.0)
Hemoglobin: 13 g/dL (ref 12.0–15.0)
Immature Granulocytes: 0 %
Lymphocytes Relative: 29 %
Lymphs Abs: 2 10*3/uL (ref 0.7–4.0)
MCH: 28.5 pg (ref 26.0–34.0)
MCHC: 31.9 g/dL (ref 30.0–36.0)
MCV: 89.3 fL (ref 80.0–100.0)
Monocytes Absolute: 0.5 10*3/uL (ref 0.1–1.0)
Monocytes Relative: 8 %
Neutro Abs: 4.2 10*3/uL (ref 1.7–7.7)
Neutrophils Relative %: 61 %
Platelets: 211 10*3/uL (ref 150–400)
RBC: 4.56 MIL/uL (ref 3.87–5.11)
RDW: 14.7 % (ref 11.5–15.5)
WBC: 7 10*3/uL (ref 4.0–10.5)
nRBC: 0 % (ref 0.0–0.2)

## 2018-09-21 LAB — COMPREHENSIVE METABOLIC PANEL
ALT: 12 U/L (ref 0–44)
AST: 19 U/L (ref 15–41)
Albumin: 4 g/dL (ref 3.5–5.0)
Alkaline Phosphatase: 47 U/L (ref 38–126)
Anion gap: 12 (ref 5–15)
BUN: 13 mg/dL (ref 8–23)
CO2: 23 mmol/L (ref 22–32)
Calcium: 9.6 mg/dL (ref 8.9–10.3)
Chloride: 107 mmol/L (ref 98–111)
Creatinine, Ser: 1.17 mg/dL — ABNORMAL HIGH (ref 0.44–1.00)
GFR calc Af Amer: 50 mL/min — ABNORMAL LOW (ref >60)
GFR calc non Af Amer: 43 mL/min — ABNORMAL LOW (ref >60)
Glucose, Bld: 84 mg/dL (ref 70–99)
Potassium: 4 mmol/L (ref 3.5–5.1)
Sodium: 142 mmol/L (ref 135–145)
Total Bilirubin: 0.5 mg/dL (ref 0.3–1.2)
Total Protein: 7.1 g/dL (ref 6.5–8.1)

## 2018-09-21 MED ORDER — DENOSUMAB 60 MG/ML ~~LOC~~ SOLN
60.0000 mg | Freq: Once | SUBCUTANEOUS | Status: AC
  Administered 2018-09-21: 12:00:00 60 mg via SUBCUTANEOUS

## 2018-09-21 MED ORDER — DENOSUMAB 60 MG/ML ~~LOC~~ SOSY
PREFILLED_SYRINGE | SUBCUTANEOUS | Status: AC
  Filled 2018-09-21: qty 1

## 2018-09-21 NOTE — Patient Instructions (Signed)
Denosumab injection What is this medicine? DENOSUMAB (den oh sue mab) slows bone breakdown. Prolia is used to treat osteoporosis in women after menopause and in men, and in people who are taking corticosteroids for 6 months or more. Xgeva is used to treat a high calcium level due to cancer and to prevent bone fractures and other bone problems caused by multiple myeloma or cancer bone metastases. Xgeva is also used to treat giant cell tumor of the bone. This medicine may be used for other purposes; ask your health care provider or pharmacist if you have questions. COMMON BRAND NAME(S): Prolia, XGEVA What should I tell my health care provider before I take this medicine? They need to know if you have any of these conditions:  dental disease  having surgery or tooth extraction  infection  kidney disease  low levels of calcium or Vitamin D in the blood  malnutrition  on hemodialysis  skin conditions or sensitivity  thyroid or parathyroid disease  an unusual reaction to denosumab, other medicines, foods, dyes, or preservatives  pregnant or trying to get pregnant  breast-feeding How should I use this medicine? This medicine is for injection under the skin. It is given by a health care professional in a hospital or clinic setting. A special MedGuide will be given to you before each treatment. Be sure to read this information carefully each time. For Prolia, talk to your pediatrician regarding the use of this medicine in children. Special care may be needed. For Xgeva, talk to your pediatrician regarding the use of this medicine in children. While this drug may be prescribed for children as young as 13 years for selected conditions, precautions do apply. Overdosage: If you think you have taken too much of this medicine contact a poison control center or emergency room at once. NOTE: This medicine is only for you. Do not share this medicine with others. What if I miss a dose? It is  important not to miss your dose. Call your doctor or health care professional if you are unable to keep an appointment. What may interact with this medicine? Do not take this medicine with any of the following medications:  other medicines containing denosumab This medicine may also interact with the following medications:  medicines that lower your chance of fighting infection  steroid medicines like prednisone or cortisone This list may not describe all possible interactions. Give your health care provider a list of all the medicines, herbs, non-prescription drugs, or dietary supplements you use. Also tell them if you smoke, drink alcohol, or use illegal drugs. Some items may interact with your medicine. What should I watch for while using this medicine? Visit your doctor or health care professional for regular checks on your progress. Your doctor or health care professional may order blood tests and other tests to see how you are doing. Call your doctor or health care professional for advice if you get a fever, chills or sore throat, or other symptoms of a cold or flu. Do not treat yourself. This drug may decrease your body's ability to fight infection. Try to avoid being around people who are sick. You should make sure you get enough calcium and vitamin D while you are taking this medicine, unless your doctor tells you not to. Discuss the foods you eat and the vitamins you take with your health care professional. See your dentist regularly. Brush and floss your teeth as directed. Before you have any dental work done, tell your dentist you are   receiving this medicine. Do not become pregnant while taking this medicine or for 5 months after stopping it. Talk with your doctor or health care professional about your birth control options while taking this medicine. Women should inform their doctor if they wish to become pregnant or think they might be pregnant. There is a potential for serious side  effects to an unborn child. Talk to your health care professional or pharmacist for more information. What side effects may I notice from receiving this medicine? Side effects that you should report to your doctor or health care professional as soon as possible:  allergic reactions like skin rash, itching or hives, swelling of the face, lips, or tongue  bone pain  breathing problems  dizziness  jaw pain, especially after dental work  redness, blistering, peeling of the skin  signs and symptoms of infection like fever or chills; cough; sore throat; pain or trouble passing urine  signs of low calcium like fast heartbeat, muscle cramps or muscle pain; pain, tingling, numbness in the hands or feet; seizures  unusual bleeding or bruising  unusually weak or tired Side effects that usually do not require medical attention (report to your doctor or health care professional if they continue or are bothersome):  constipation  diarrhea  headache  joint pain  loss of appetite  muscle pain  runny nose  tiredness  upset stomach This list may not describe all possible side effects. Call your doctor for medical advice about side effects. You may report side effects to FDA at 1-800-FDA-1088. Where should I keep my medicine? This medicine is only given in a clinic, doctor's office, or other health care setting and will not be stored at home. NOTE: This sheet is a summary. It may not cover all possible information. If you have questions about this medicine, talk to your doctor, pharmacist, or health care provider.  2020 Elsevier/Gold Standard (2017-05-27 16:10:44)

## 2018-09-21 NOTE — Progress Notes (Signed)
[place]  [phone] [phone]) [phone]    ID: Martha Edwards DOB: [birth year]  MR#: [id number]  NKN#:[id number]  Patient Care Team: Martha Jordan, MD as PCP - General (Family Medicine) Martha Crome, MD as PCP - Cardiology (Cardiology) Martha Seltzer, MD as Consulting Physician (General Surgery) Edwards, Martha Dad, MD as Consulting Physician (Oncology) Martha Gibson, MD as Attending Physician (Radiation Oncology) OTHER MD:   CHIEF COMPLAINT: Estrogen receptor positive breast cancer  CURRENT TREATMENT: Anastrozole; denosumab/Prolia   BREAST CANCER HISTORY: From the original intake note:  The patient had routine screening mammography at the [facility] 01/20/2015 showing a possible mass in the right breast. On 02/19/2015 she returned for right diagnostic mammography with tomosynthesis and right breast ultrasonography. The breast density was category B. In the upper inner quadrant of the right breast there was a 1.3 cm irregular mass which was palpable by exam. Ultrasonography measured this at 1.3 cm maximally. Ultrasound of the right axilla was unremarkable.  Biopsy of this mass 02/27/2015 showed ([id number]) an invasive ductal carcinoma, grade 1 or 2, 100% estrogen receptor positive, with strong staining intensity; 90% progesterone receptor positive with moderate staining intensity, with an MIB-1 of 10%, and no HER-2 implication, the signals ratio 1.15 and the number per cell 1.95.  The case was discussed at breast cancer conference and it was decided sentinel lymph node sampling and Oncotype would not be needed. The patient proceeded to right lumpectomy 04/07/2015, with the final pathology ([id number]) showing an invasive ductal carcinoma, grade 1, measuring 1.3 cm. Margins were negative. Repeat HER-2 was again negative, with a signals ratio of 1.43, the number per cell being 2.00.  The patient met with radiation oncology 05/16/2015. At that time  the possibility of adjuvant radiation versus anti-estrogens only was discussed. The patient was supposed to have seen me after that visit but she did not show. She subsequently called and said that she would like "pill" rather than radiation and was started on anastrozole April 2017.  Her subsequent history is as detailed below   INTERVAL HISTORY: Martha Edwards returns today for follow-up and treatment of her estrogen receptor positive breast cancer.   She continues on anastrozole.  She denies any hot flashes, vaginal dryness, or arthralgias.  She tolerates this well.   She also receives Prolia/denosumab, with a dose due today. She tolerates this well and denies any dental or oral issues, or any other side effects from taking this.    REVIEW OF SYSTEMS: Martha Edwards is doing well today.  She sees her PCP regularly.  She has not been exercising regularly due to the [place] pandemic.    Martha Edwards denies any fever, chills, chest pain, palpitations, cough, or shortness of breath.  She is without nausea, vomiting, bowel/bladder changes, headaches, vision changes, or any other concerns.  A detailed ROS was otherwise non-contributory.    PAST MEDICAL HISTORY: Past Medical History:  Diagnosis Date  . Anticoagulated   . Anxiety   . Arthritis of hand   . Atrial flutter ([place])    with LVEF 60%, LVH, LAE and holter with rate control and Ave HR 60 bpm   . Carotid artery plaque    mild bilateral carotid plaque without significant stenosis (<20% in left ICA), 04/2010  . CKD (chronic kidney disease) stage 2, GFR 60-89 ml/min   . Diverticula, colon   . Family history of breast cancer   . Family history of prostate cancer   . Hypercholesterolemia   . Hypertension  with LVH on echo 06/03/11  . Hypertonicity of bladder   . Insomnia   . Mitral regurgitation   . Osteopenia    > osteoporosis  . Overactive bladder     PAST SURGICAL HISTORY: Past Surgical History:  Procedure Laterality Date  . BREAST BIOPSY    . BREAST  LUMPECTOMY Right    2017  . BREAST LUMPECTOMY WITH RADIOACTIVE SEED LOCALIZATION Right 04/07/2015   Procedure: RIGHT BREAST LUMPECTOMY WITH RADIOACTIVE SEED LOCALIZATION;  Surgeon: Martha Seltzer, MD;  Location: [facility];  Service: General;  Laterality: Right;  . COLONOSCOPY  2001   2007 sigmoidoscopy     FAMILY HISTORY: Family History  Problem Relation Age of Onset  . Diabetes Mother   . Liver cancer Mother   . Breast cancer Sister 14  . Prostate cancer Brother 50       metastatic  . Heart attack Neg Hx   . Stroke Neg Hx    Patient's father died in his 73s from causes unknown to the patient. The patient's mother died at the age of 79 with what may have been breast cancer. The patient had 2 brothers one sister. One sister was diagnosed with breast cancer in her early 72s.   GYNECOLOGIC HISTORY:  No LMP recorded. Patient is postmenopausal. Menarche age 71, first live birth age 89, the patient is [place] P3. She does not recall when she went through menopause. She never took hormone replacement or oral contraceptives   SOCIAL HISTORY:  The patient worked at [employer] for many years. She is now retired. She is widowed, lives by herself, with no pets. Her daughter Martha Edwards is an Optometrist for the [employer] system. Son Martha Edwards lives in [place] and is disabled. Son Martha Edwards lives in [place] and works for [employer]. The patient has 4 grandchildren and 2 great-grandchildren.   ADVANCED DIRECTIVES: The patient tells me her daughter Martha Hauser is her healthcare part of attorney. She can be reached at [place]: Social History   Tobacco Use  . Smoking status: Former Smoker    Quit date: 02/02/1984    Years since quitting: 34.6  . Smokeless tobacco: Never Used  Substance Use Topics  . Alcohol use: Not on file  . Drug use: Not on file    Colonoscopy:  PAP:  Bone density:   No Known Allergies  Current Outpatient  Medications  Medication Sig Dispense Refill  . acetaminophen (TYLENOL) 325 MG tablet Take 650 mg by mouth every 6 (six) hours as needed for mild pain.    Marland Kitchen amLODipine (NORVASC) 2.5 MG tablet Take 1 tablet (2.5 mg total) by mouth daily. 30 tablet 10  . anastrozole (ARIMIDEX) 1 MG tablet TAKE 1 TABLET BY MOUTH EVERY Edwards 90 tablet 4  . atorvastatin (LIPITOR) 40 MG tablet Take 40 mg by mouth daily.    Marland Kitchen CALCIUM PO Take 1 tablet by mouth daily.    . Cholecalciferol (VITAMIN D3 PO) Take 1 tablet by mouth daily.    . Denosumab (PROLIA [place]) Take as directed once every 6 months.    . metoprolol succinate (TOPROL-XL) 25 MG 24 hr tablet Take 12.5 mg by mouth daily.    . mirtazapine (REMERON) 15 MG tablet Take 15 mg by mouth at bedtime.    Marland Kitchen warfarin (COUMADIN) 4 MG tablet TAKE AS DIRECTED BY COUMADIN CLINIC 135 tablet 1   No current facility-administered medications for this visit.     OBJECTIVE:  Vitals:   09/21/18 1000  BP: (!) 155/61  Pulse: (!) 54  Resp: 18  Temp: 99.1 F (37.3 C)  SpO2: 94%     Body mass index is 24.25 kg/m.    ECOG FS:0 - Asymptomatic GENERAL: Patient is a well appearing female in no acute distress HEENT:  Sclerae anicteric.  Oropharynx clear and moist. No ulcerations or evidence of oropharyngeal candidiasis. Neck is supple.  NODES:  No cervical, supraclavicular, or axillary lymphadenopathy palpated.  BREAST EXAM:  Right breast s/p lumpectomy, no sign of local recurrence, left breast is benign LUNGS:  Clear to auscultation bilaterally.  No wheezes or rhonchi. HEART:  Regular rate and rhythm. No murmur appreciated. ABDOMEN:  Soft, nontender.  Positive, normoactive bowel sounds. No organomegaly palpated. MSK:  No focal spinal tenderness to palpation. Full range of motion bilaterally in the upper extremities. EXTREMITIES:  No peripheral edema.   SKIN:  Clear with no obvious rashes or skin changes. No nail dyscrasia. NEURO:  Nonfocal. Well oriented.  Appropriate affect.     LAB RESULTS:  CMP     Component Value Date/Time   NA 142 09/21/2018 0945   NA 142 10/21/2016 1334   K 4.0 09/21/2018 0945   K 4.2 10/21/2016 1334   CL 107 09/21/2018 0945   CO2 23 09/21/2018 0945   CO2 25 10/21/2016 1334   GLUCOSE 84 09/21/2018 0945   GLUCOSE 85 10/21/2016 1334   BUN 13 09/21/2018 0945   BUN 11.5 10/21/2016 1334   CREATININE 1.17 (H) 09/21/2018 0945   CREATININE 0.9 10/21/2016 1334   CALCIUM 9.6 09/21/2018 0945   CALCIUM 9.9 10/21/2016 1334   PROT 7.1 09/21/2018 0945   PROT 7.2 10/21/2016 1334   ALBUMIN 4.0 09/21/2018 0945   ALBUMIN 4.1 10/21/2016 1334   AST 19 09/21/2018 0945   AST 19 10/21/2016 1334   ALT 12 09/21/2018 0945   ALT 10 10/21/2016 1334   ALKPHOS 47 09/21/2018 0945   ALKPHOS 49 10/21/2016 1334   BILITOT 0.5 09/21/2018 0945   BILITOT 0.76 10/21/2016 1334   GFRNONAA 43 (L) 09/21/2018 0945   GFRAA 50 (L) 09/21/2018 0945    INo results found for: SPEP, UPEP  Lab Results  Component Value Date   WBC 7.0 09/21/2018   NEUTROABS 4.2 09/21/2018   HGB 13.0 09/21/2018   HCT 40.7 09/21/2018   MCV 89.3 09/21/2018   PLT 211 09/21/2018      Chemistry      Component Value Date/Time   NA 142 09/21/2018 0945   NA 142 10/21/2016 1334   K 4.0 09/21/2018 0945   K 4.2 10/21/2016 1334   CL 107 09/21/2018 0945   CO2 23 09/21/2018 0945   CO2 25 10/21/2016 1334   BUN 13 09/21/2018 0945   BUN 11.5 10/21/2016 1334   CREATININE 1.17 (H) 09/21/2018 0945   CREATININE 0.9 10/21/2016 1334      Component Value Date/Time   CALCIUM 9.6 09/21/2018 0945   CALCIUM 9.9 10/21/2016 1334   ALKPHOS 47 09/21/2018 0945   ALKPHOS 49 10/21/2016 1334   AST 19 09/21/2018 0945   AST 19 10/21/2016 1334   ALT 12 09/21/2018 0945   ALT 10 10/21/2016 1334   BILITOT 0.5 09/21/2018 0945   BILITOT 0.76 10/21/2016 1334       No results found for: LABCA2  No components found for: LABCA125  No results for input(s): INR in the last 168 hours.  Urinalysis No  results found for: COLORURINE, APPEARANCEUR,  LABSPEC, PHURINE, GLUCOSEU, HGBUR, BILIRUBINUR, KETONESUR, PROTEINUR, UROBILINOGEN, NITRITE, LEUKOCYTESUR   STUDIES:   ELIGIBLE FOR AVAILABLE RESEARCH PROTOCOL: no   ASSESSMENT: 85 y.o. [place] woman status post right breast upper inner quadrant lumpectomy 04/07/2015 for a pT1c cN0, stage IA invasive ductal carcinoma, grade 1, estrogen and progesterone receptor positive, HER-2 nonamplified, with an MIB-1 of 10%.  (1) the patient opted against adjuvant radiation   (2) started anastrozole April 2017    (3) may qualify for genetics counseling  (4) severe osteopenia, denosumab/Prolia started 05/06/2016  (a) bone density 01/20/2016 showed a T score of -2.4 (no change)  (b) bone density 02/2018 shows T score of -2.4    PLAN: Marvis is doing well today.  She is without any clinical or radiographic signs of recurrence.  She is tolerating the anastrozole well and will continue this.    Anitha's most recent bone density is unchanged.  She will continue on Prolia every 6 months and is tolerating this well.  We reviewed healthy diet and exercise today in detail.  I also gave her information about bone health in her AVS.    Kahealani will return in 6 months for labs, f/u, and Prolia.  She was recommended to continue with the appropriate pandemic precautions. Marland Kitchenlccall  A total of (20) minutes of face-to-face time was spent with this patient with greater than 50% of that time in counseling and care-coordination.   Wilber Bihari, NP  09/21/18 11:00 AM Medical Oncology and Hematology [facility] [address] [place], [place] [address] Tel. [phone]    Fax. [phone]

## 2018-09-21 NOTE — Patient Instructions (Signed)

## 2018-09-26 NOTE — Telephone Encounter (Signed)
Left vm for pt regarding prescreening questions for appointment.

## 2018-10-12 ENCOUNTER — Other Ambulatory Visit: Payer: Self-pay

## 2018-10-12 ENCOUNTER — Ambulatory Visit (INDEPENDENT_AMBULATORY_CARE_PROVIDER_SITE_OTHER): Payer: Medicare Other | Admitting: *Deleted

## 2018-10-12 DIAGNOSIS — I4892 Unspecified atrial flutter: Secondary | ICD-10-CM

## 2018-10-12 DIAGNOSIS — Z5181 Encounter for therapeutic drug level monitoring: Secondary | ICD-10-CM | POA: Diagnosis not present

## 2018-10-12 LAB — POCT INR: INR: 3.2 — AB (ref 2.0–3.0)

## 2018-10-12 NOTE — Patient Instructions (Signed)
Description   Hold today, then continue on same dosage 1.5 tablets (6mg ) daily. Recheck INR in 6 weeks. Coumadin Clinic [phone]

## 2018-10-18 DIAGNOSIS — Z23 Encounter for immunization: Secondary | ICD-10-CM | POA: Diagnosis not present

## 2018-11-11 ENCOUNTER — Other Ambulatory Visit: Payer: Self-pay | Admitting: Physician Assistant

## 2018-11-23 ENCOUNTER — Ambulatory Visit (INDEPENDENT_AMBULATORY_CARE_PROVIDER_SITE_OTHER): Payer: Medicare Other | Admitting: *Deleted

## 2018-11-23 ENCOUNTER — Other Ambulatory Visit: Payer: Self-pay

## 2018-11-23 DIAGNOSIS — Z5181 Encounter for therapeutic drug level monitoring: Secondary | ICD-10-CM | POA: Diagnosis not present

## 2018-11-23 LAB — POCT INR: INR: 2.7 (ref 2.0–3.0)

## 2018-11-23 NOTE — Patient Instructions (Signed)
Description   Continue on same dosage 1.5 tablets (6mg) daily. Recheck INR in 6 weeks. Coumadin Clinic [phone]     

## 2018-12-19 ENCOUNTER — Other Ambulatory Visit: Payer: Self-pay | Admitting: Oncology

## 2018-12-19 DIAGNOSIS — Z853 Personal history of malignant neoplasm of breast: Secondary | ICD-10-CM

## 2018-12-20 NOTE — Progress Notes (Signed)
Cardiology Office Note:    Date:  12/21/2018   ID:  Martha Edwards, DOB 27-Oct-1932, MRN [id number]  PCP:  [name], MD  Cardiologist:  Sinclair Grooms, MD   Referring MD: [name], MD   Chief Complaint  Patient presents with  . Atrial Fibrillation    History of Present Illness:    Martha Edwards is a 85 y.o. female with a hx of tachybradycardia syndrome, PAF, hypertension, and chronic anticoagulation therapy  There are no cardiac complaints.  The COVID-19 pandemic has caused her to become quite sedentary.  She has not had palpitations, syncope, chest pain, orthopnea, or PND.  She previously walked on a regular basis and exercise at the [facility] but has been frightened of exposure to Covid.  Past Medical History:  Diagnosis Date  . Anticoagulated   . Anxiety   . Arthritis of hand   . Atrial flutter ([place])    with LVEF 60%, LVH, LAE and holter with rate control and Ave HR 60 bpm   . Carotid artery plaque    mild bilateral carotid plaque without significant stenosis (<20% in left ICA), 04/2010  . CKD (chronic kidney disease) stage 2, GFR 60-89 ml/min   . Diverticula, colon   . Family history of breast cancer   . Family history of prostate cancer   . Hypercholesterolemia   . Hypertension    with LVH on echo 06/03/11  . Hypertonicity of bladder   . Insomnia   . Mitral regurgitation   . Osteopenia    > osteoporosis  . Overactive bladder     Past Surgical History:  Procedure Laterality Date  . BREAST BIOPSY    . BREAST LUMPECTOMY Right    2017  . BREAST LUMPECTOMY WITH RADIOACTIVE SEED LOCALIZATION Right 04/07/2015   Procedure: RIGHT BREAST LUMPECTOMY WITH RADIOACTIVE SEED LOCALIZATION;  Surgeon: Excell Seltzer, MD;  Location: [facility];  Service: General;  Laterality: Right;  . COLONOSCOPY  2001   2007 sigmoidoscopy     Current Medications: Current Meds  Medication Sig  . acetaminophen (TYLENOL) 325 MG tablet Take 650 mg by mouth every 6  (six) hours as needed for mild pain.  Marland Kitchen amLODipine (NORVASC) 2.5 MG tablet Take 1 tablet (2.5 mg total) by mouth daily. Please keep upcoming appt in November with Dr. Tamala Julian before anymore refills. Thank you  . anastrozole (ARIMIDEX) 1 MG tablet TAKE 1 TABLET BY MOUTH EVERY DAY  . atorvastatin (LIPITOR) 40 MG tablet Take 40 mg by mouth daily.  Marland Kitchen CALCIUM PO Take 1 tablet by mouth daily.  . Cholecalciferol (VITAMIN D3 PO) Take 1 tablet by mouth daily.  . Denosumab (PROLIA [place]) Take as directed once every 6 months.  . metoprolol succinate (TOPROL-XL) 25 MG 24 hr tablet Take 12.5 mg by mouth daily.  . mirtazapine (REMERON) 15 MG tablet Take 15 mg by mouth at bedtime.  Marland Kitchen warfarin (COUMADIN) 4 MG tablet TAKE AS DIRECTED BY COUMADIN CLINIC     Allergies:   Patient has no known allergies.   Social History   Socioeconomic History  . Marital status: Single    Spouse name: Not on file  . Number of children: Not on file  . Years of education: Not on file  . Highest education level: Not on file  Occupational History  . Not on file  Social Needs  . Financial resource strain: Not on file  . Food insecurity    Worry: Not on file  Inability: Not on file  . Transportation needs    Medical: Not on file    Non-medical: Not on file  Tobacco Use  . Smoking status: Former Smoker    Quit date: 02/02/1984    Years since quitting: 34.9  . Smokeless tobacco: Never Used  Substance and Sexual Activity  . Alcohol use: Not on file  . Drug use: Not on file  . Sexual activity: Never  Lifestyle  . Physical activity    Days per week: Not on file    Minutes per session: Not on file  . Stress: Not on file  Relationships  . Social Herbalist on phone: Not on file    Gets together: Not on file    Attends religious service: Not on file    Active member of club or organization: Not on file    Attends meetings of clubs or organizations: Not on file    Relationship status: Not on file  Other  Topics Concern  . Not on file  Social History Narrative  . Not on file     Family History: The patient's family history includes Breast cancer (age of onset: 21) in her sister; Diabetes in her mother; Liver cancer in her mother; Prostate cancer (age of onset: 64) in her brother. There is no history of Heart attack or Stroke.  ROS:   Please see the history of present illness.    No blood in her stool.  No neurological symptoms.  Overall doing quite well.  All other systems reviewed and are negative.  EKGs/Labs/Other Studies Reviewed:    The following studies were reviewed today: 2D Doppler echocardiogram 11/29/2017 Study Conclusions  - Left ventricle: The cavity size was normal. Wall thickness was   normal. Systolic function was normal. The estimated ejection   fraction was in the range of 60% to 65%. Wall motion was normal;   there were no regional wall motion abnormalities. Doppler   parameters are consistent with abnormal left ventricular   relaxation (grade 1 diastolic dysfunction). - Aortic valve: There was mild regurgitation. - Mitral valve: There was mild regurgitation. - Left atrium: The atrium was mildly dilated  EKG:  EKG sinus bradycardia, at 57 bpm.  No evidence of infarction.  Wandering baseline.  Recent Labs: 09/21/2018: ALT 12; BUN 13; Creatinine, Ser 1.17; Hemoglobin 13.0; Platelets 211; Potassium 4.0; Sodium 142  Recent Lipid Panel No results found for: CHOL, TRIG, HDL, CHOLHDL, VLDL, LDLCALC, LDLDIRECT  Physical Exam:    VS:  BP (!) 142/70   Pulse (!) 57   Ht 5\' 8"  (1.727 m)   Wt 159 lb 12.8 oz (72.5 kg)   SpO2 97%   BMI 24.30 kg/m     Wt Readings from Last 3 Encounters:  12/21/18 159 lb 12.8 oz (72.5 kg)  09/21/18 159 lb 8 oz (72.3 kg)  03/23/18 158 lb 3.2 oz (71.8 kg)     GEN: Appears younger than stated age.. No acute distress HEENT: Normal NECK: No JVD. LYMPHATICS: No lymphadenopathy CARDIAC: There is a 1/6 right upper sternal systolic  without diastolic RRR without mitral murmur, gallop, or edema. VASCULAR:  Normal Pulses. No bruits. RESPIRATORY:  Clear to auscultation without rales, wheezing or rhonchi  ABDOMEN: Soft, non-tender, non-distended, No pulsatile mass, MUSCULOSKELETAL: No deformity  SKIN: Warm and dry NEUROLOGIC:  Alert and oriented x 3 PSYCHIATRIC:  Normal affect   ASSESSMENT:    1. Chronic anticoagulation   2. Tachy-brady syndrome ([place])  3. Paroxysmal atrial fibrillation (HCC)   4. Essential hypertension   5. Hyperlipidemia LDL goal <70   6. Educated about COVID-19 virus infection    PLAN:    In order of problems listed above:  1. No complications 2. No obvious evidence of atrial fibrillation today.  She has tachybradycardia syndrome and is currently stable. 3. Currently in sinus rhythm.  No neurological complaints or palpitations.  Continue chronic anticoagulation therapy for [facility]. 4. Blood pressure control is adequate given the patient's age. 5. LDL target less than or equal to 70. 6. The 3W's is adhered to to prevent COVID-19 infection.  Clinical follow-up in 1 year.   Medication Adjustments/Labs and Tests Ordered: Current medicines are reviewed at length with the patient today.  Concerns regarding medicines are outlined above.  Orders Placed This Encounter  Procedures  . EKG 12-Lead   No orders of the defined types were placed in this encounter.   Patient Instructions  Medication Instructions:  Your physician recommends that you continue on your current medications as directed. Please refer to the Current Medication list given to you today.  *If you need a refill on your cardiac medications before your next appointment, please call your pharmacy*  Lab Work: None If you have labs (blood work) drawn today and your tests are completely normal, you will receive your results only by: Marland Kitchen MyChart Message (if you have MyChart) OR . A paper copy in the mail If you have any lab  test that is abnormal or we need to change your treatment, we will call you to review the results.  Testing/Procedures: None  Follow-Up: At [facility], you and your health needs are our priority.  As part of our continuing mission to provide you with exceptional heart care, we have created designated Provider Care Teams.  These Care Teams include your primary Cardiologist (physician) and Advanced Practice Providers (APPs -  Physician Assistants and Nurse Practitioners) who all work together to provide you with the care you need, when you need it.  Your next appointment:   12 month(s)  The format for your next appointment:   In Person  Provider:   You may see Sinclair Grooms, MD or one of the following Advanced Practice Providers on your designated Care Team:    Truitt Merle, NP  Cecilie Kicks, NP  Kathyrn Drown, NP   Other Instructions      Signed, Sinclair Grooms, MD  12/21/2018 2:14 PM    [place]

## 2018-12-21 ENCOUNTER — Ambulatory Visit (INDEPENDENT_AMBULATORY_CARE_PROVIDER_SITE_OTHER): Payer: Medicare Other | Admitting: Interventional Cardiology

## 2018-12-21 ENCOUNTER — Other Ambulatory Visit: Payer: Self-pay

## 2018-12-21 ENCOUNTER — Encounter: Payer: Self-pay | Admitting: Interventional Cardiology

## 2018-12-21 VITALS — BP 142/70 | HR 57 | Ht 68.0 in | Wt 159.8 lb

## 2018-12-21 DIAGNOSIS — Z7901 Long term (current) use of anticoagulants: Secondary | ICD-10-CM | POA: Diagnosis not present

## 2018-12-21 DIAGNOSIS — Z7189 Other specified counseling: Secondary | ICD-10-CM

## 2018-12-21 DIAGNOSIS — E785 Hyperlipidemia, unspecified: Secondary | ICD-10-CM | POA: Diagnosis not present

## 2018-12-21 DIAGNOSIS — I495 Sick sinus syndrome: Secondary | ICD-10-CM

## 2018-12-21 DIAGNOSIS — I480 Paroxysmal atrial fibrillation: Secondary | ICD-10-CM

## 2018-12-21 DIAGNOSIS — I10 Essential (primary) hypertension: Secondary | ICD-10-CM

## 2018-12-21 NOTE — Patient Instructions (Signed)

## 2019-01-04 ENCOUNTER — Other Ambulatory Visit: Payer: Self-pay

## 2019-01-04 ENCOUNTER — Ambulatory Visit (INDEPENDENT_AMBULATORY_CARE_PROVIDER_SITE_OTHER): Payer: Medicare Other

## 2019-01-04 DIAGNOSIS — I4892 Unspecified atrial flutter: Secondary | ICD-10-CM

## 2019-01-04 DIAGNOSIS — Z5181 Encounter for therapeutic drug level monitoring: Secondary | ICD-10-CM | POA: Diagnosis not present

## 2019-01-04 LAB — POCT INR: INR: 2.9 (ref 2.0–3.0)

## 2019-01-04 NOTE — Patient Instructions (Signed)
Description   Continue on same dosage 1.5 tablets (6mg ) daily. Recheck INR in 6 weeks. Coumadin Clinic [phone]

## 2019-01-16 ENCOUNTER — Other Ambulatory Visit: Payer: Self-pay | Admitting: Interventional Cardiology

## 2019-01-16 ENCOUNTER — Other Ambulatory Visit: Payer: Self-pay | Admitting: Physician Assistant

## 2019-01-29 ENCOUNTER — Ambulatory Visit
Admission: RE | Admit: 2019-01-29 | Discharge: 2019-01-29 | Disposition: A | Discharge status: Home / Self Care | Payer: Medicare Other | Source: Ambulatory Visit | Attending: Oncology | Admitting: Oncology

## 2019-01-29 ENCOUNTER — Other Ambulatory Visit: Payer: Self-pay

## 2019-01-29 DIAGNOSIS — Z853 Personal history of malignant neoplasm of breast: Secondary | ICD-10-CM

## 2019-02-15 ENCOUNTER — Ambulatory Visit (INDEPENDENT_AMBULATORY_CARE_PROVIDER_SITE_OTHER): Payer: Medicare Other | Admitting: *Deleted

## 2019-02-15 ENCOUNTER — Other Ambulatory Visit: Payer: Self-pay

## 2019-02-15 DIAGNOSIS — Z5181 Encounter for therapeutic drug level monitoring: Secondary | ICD-10-CM | POA: Diagnosis not present

## 2019-02-15 LAB — POCT INR: INR: 2.8 (ref 2.0–3.0)

## 2019-02-15 NOTE — Patient Instructions (Signed)
Description   Continue on same dosage 1.5 tablets (6mg ) daily. Recheck INR in 6 weeks. Coumadin Clinic [phone]

## 2019-03-29 ENCOUNTER — Other Ambulatory Visit: Payer: Self-pay

## 2019-03-29 ENCOUNTER — Ambulatory Visit (INDEPENDENT_AMBULATORY_CARE_PROVIDER_SITE_OTHER): Payer: Medicare Other | Admitting: *Deleted

## 2019-03-29 DIAGNOSIS — Z5181 Encounter for therapeutic drug level monitoring: Secondary | ICD-10-CM

## 2019-03-29 LAB — POCT INR: INR: 2.7 (ref 2.0–3.0)

## 2019-03-29 NOTE — Patient Instructions (Signed)
Description   Continue on same dosage 1.5 tablets (6mg ) daily. Recheck INR in 7 weeks. Coumadin Clinic [phone]

## 2019-04-04 NOTE — Progress Notes (Signed)
[place]  Telephone:([phone]) [phone] Fax:([phone]) [phone]    ID: Martha Edwards DOB: [birth year]  MR#: [id number]  IOX#:[id number]  Patient Care Team: [name], MD as PCP - General (Family Medicine) Belva Crome, MD as PCP - Cardiology (Cardiology) Excell Seltzer, MD (Inactive) as Consulting Physician (General Surgery) Tianna Baus, Virgie Dad, MD as Consulting Physician (Oncology) Eppie Gibson, MD as Attending Physician (Radiation Oncology) OTHER MD:   CHIEF COMPLAINT: Estrogen receptor positive breast cancer  CURRENT TREATMENT: Anastrozole; denosumab/Prolia   INTERVAL HISTORY: Martha Edwards returns today for follow-up and treatment of her estrogen receptor positive breast cancer.   She continues on anastrozole.  She tolerates this well with no hot flashes, vaginal dryness or other issues.  She obtains an adequate gas.  She also receives Prolia/denosumab, with a dose due today.  She has had no complications from this treatment. Her most recent bone density screening on 02/15/2018 showed a T-score of -2.4.  Since her last visit, she underwent bilateral diagnostic mammography with tomography at [facility] on 01/29/2019 showing: breast density category C; no evidence of malignancy in either breast.    REVIEW OF SYSTEMS: Malesha has had both her COVID-19 vaccines.  She had no complications from that.  [facility] will [facility].  She used to go to 3 times a week and really enjoyed it.  In the meantime she does a little bit of walking outside.  She has not yet started her yard work.  She tells me her family is doing well and her granddaughter just completed high school.  A detailed review of systems today was otherwise noncontributory     BREAST CANCER HISTORY: From the original intake note:  The patient had routine screening mammography at the [facility] 01/20/2015 showing a possible mass in the right  breast. On 02/19/2015 she returned for right diagnostic mammography with tomosynthesis and right breast ultrasonography. The breast density was category B. In the upper inner quadrant of the right breast there was a 1.3 cm irregular mass which was palpable by exam. Ultrasonography measured this at 1.3 cm maximally. Ultrasound of the right axilla was unremarkable.  Biopsy of this mass 02/27/2015 showed ([id number]) an invasive ductal carcinoma, grade 1 or 2, 100% estrogen receptor positive, with strong staining intensity; 90% progesterone receptor positive with moderate staining intensity, with an MIB-1 of 10%, and no HER-2 implication, the signals ratio 1.15 and the number per cell 1.95.  The case was discussed at breast cancer conference and it was decided sentinel lymph node sampling and Oncotype would not be needed. The patient proceeded to right lumpectomy 04/07/2015, with the final pathology ([id number]) showing an invasive ductal carcinoma, grade 1, measuring 1.3 cm. Margins were negative. Repeat HER-2 was again negative, with a signals ratio of 1.43, the number per cell being 2.00.  The patient met with radiation oncology 05/16/2015. At that time the possibility of adjuvant radiation versus anti-estrogens only was discussed. The patient was supposed to have seen me after that visit but she did not show. She subsequently called and said that she would like "pill" rather than radiation and was started on anastrozole April 2017.  Her subsequent history is as detailed below   PAST MEDICAL HISTORY: Past Medical History:  Diagnosis Date  . Anticoagulated   . Anxiety   . Arthritis of hand   . Atrial flutter ([place])    with LVEF 60%,  LVH, LAE and holter with rate control and Ave HR 60 bpm   . Carotid artery plaque    mild bilateral carotid plaque without significant stenosis (<20% in left ICA), 04/2010  . CKD (chronic kidney disease) stage 2, GFR 60-89 ml/min   . Diverticula, colon   .  Family history of breast cancer   . Family history of prostate cancer   . Hypercholesterolemia   . Hypertension    with LVH on echo 06/03/11  . Hypertonicity of bladder   . Insomnia   . Mitral regurgitation   . Osteopenia    > osteoporosis  . Overactive bladder     PAST SURGICAL HISTORY: Past Surgical History:  Procedure Laterality Date  . BREAST BIOPSY    . BREAST LUMPECTOMY Right    2017  . BREAST LUMPECTOMY WITH RADIOACTIVE SEED LOCALIZATION Right 04/07/2015   Procedure: RIGHT BREAST LUMPECTOMY WITH RADIOACTIVE SEED LOCALIZATION;  Surgeon: Excell Seltzer, MD;  Location: [facility];  Service: General;  Laterality: Right;  . COLONOSCOPY  2001   2007 sigmoidoscopy     FAMILY HISTORY: Family History  Problem Relation Age of Onset  . Diabetes Mother   . Liver cancer Mother   . Breast cancer Sister 51  . Prostate cancer Brother 42       metastatic  . Heart attack Neg Hx   . Stroke Neg Hx    Patient's father died in his 87s from causes unknown to the patient. The patient's mother died at the age of 45 with what may have been breast cancer. The patient had 2 brothers one sister. One sister was diagnosed with breast cancer in her early 50s.   GYNECOLOGIC HISTORY:  No LMP recorded. Patient is postmenopausal. Menarche age 86, first live birth age 34, the patient is [place] P3. She does not recall when she went through menopause. She never took hormone replacement or oral contraceptives   SOCIAL HISTORY:  The patient worked at [employer] for many years. She is now retired. She is widowed, lives by herself, with no pets. Her daughter [name] is an Optometrist for the [employer] system. Son [name] lives in [place] and is disabled. Son [name] lives in [place] and works for [employer]. The patient has 4 grandchildren and 2 great-grandchildren.   ADVANCED DIRECTIVES: The patient tells me her daughter Olin Hauser is her healthcare part of attorney. She  can be reached at [place]: Social History   Tobacco Use  . Smoking status: Former Smoker    Quit date: 02/02/1984    Years since quitting: 35.1  . Smokeless tobacco: Never Used  Substance Use Topics  . Alcohol use: Not on file  . Drug use: Not on file    Colonoscopy:  PAP:  Bone density:   No Known Allergies  Current Outpatient Medications  Medication Sig Dispense Refill  . acetaminophen (TYLENOL) 325 MG tablet Take 650 mg by mouth every 6 (six) hours as needed for mild pain.    Marland Kitchen amLODipine (NORVASC) 2.5 MG tablet Take 1 tablet (2.5 mg total) by mouth daily. 90 tablet 3  . anastrozole (ARIMIDEX) 1 MG tablet TAKE 1 TABLET BY MOUTH EVERY DAY 90 tablet 4  . atorvastatin (LIPITOR) 40 MG tablet Take 40 mg by mouth daily.    Marland Kitchen CALCIUM PO Take 1 tablet by mouth daily.    . Cholecalciferol (VITAMIN D3 PO) Take 1 tablet by mouth daily.    . Denosumab (  PROLIA [place]) Take as directed once every 6 months.    . metoprolol succinate (TOPROL-XL) 25 MG 24 hr tablet Take 12.5 mg by mouth daily.    . mirtazapine (REMERON) 15 MG tablet Take 15 mg by mouth at bedtime.    Marland Kitchen warfarin (COUMADIN) 4 MG tablet TAKE AS DIRECTED BY COUMADIN CLINIC **90 DAY SUPPLY 135 tablet 1   No current facility-administered medications for this visit.    OBJECTIVE: Older African-American woman who appears well Vitals:   04/05/19 1009  BP: (!) 153/75  Pulse: 68  Resp: 18  Temp: (!) 97.4 F (36.3 C)  SpO2: 100%     Body mass index is 23.49 kg/m.    ECOG FS:1 - Symptomatic but completely ambulatory  Sclerae unicteric, EOMs intact Wearing a mask No cervical or supraclavicular adenopathy Lungs no rales or rhonchi Heart regular rate and rhythm Abd soft, nontender, positive bowel sounds MSK no focal spinal tenderness, no upper extremity lymphedema Neuro: nonfocal, well oriented, appropriate affect Breasts: The right breast is status post lumpectomy (no radiation); there is no evidence  of local recurrence.  The left breast is benign.  Both axillae are benign.   LAB RESULTS:  CMP     Component Value Date/Time   NA 143 04/05/2019 0951   NA 142 10/21/2016 1334   K 3.8 04/05/2019 0951   K 4.2 10/21/2016 1334   CL 109 04/05/2019 0951   CO2 25 04/05/2019 0951   CO2 25 10/21/2016 1334   GLUCOSE 89 04/05/2019 0951   GLUCOSE 85 10/21/2016 1334   BUN 12 04/05/2019 0951   BUN 11.5 10/21/2016 1334   CREATININE 1.09 (H) 04/05/2019 0951   CREATININE 0.9 10/21/2016 1334   CALCIUM 9.5 04/05/2019 0951   CALCIUM 9.9 10/21/2016 1334   PROT 7.1 04/05/2019 0951   PROT 7.2 10/21/2016 1334   ALBUMIN 4.2 04/05/2019 0951   ALBUMIN 4.1 10/21/2016 1334   AST 19 04/05/2019 0951   AST 19 10/21/2016 1334   ALT 11 04/05/2019 0951   ALT 10 10/21/2016 1334   ALKPHOS 49 04/05/2019 0951   ALKPHOS 49 10/21/2016 1334   BILITOT 0.6 04/05/2019 0951   BILITOT 0.76 10/21/2016 1334   GFRNONAA 47 (L) 04/05/2019 0951   GFRAA 54 (L) 04/05/2019 0951    INo results found for: SPEP, UPEP  Lab Results  Component Value Date   WBC 7.4 04/05/2019   NEUTROABS 4.2 04/05/2019   HGB 13.3 04/05/2019   HCT 41.1 04/05/2019   MCV 87.3 04/05/2019   PLT 213 04/05/2019      Chemistry      Component Value Date/Time   NA 143 04/05/2019 0951   NA 142 10/21/2016 1334   K 3.8 04/05/2019 0951   K 4.2 10/21/2016 1334   CL 109 04/05/2019 0951   CO2 25 04/05/2019 0951   CO2 25 10/21/2016 1334   BUN 12 04/05/2019 0951   BUN 11.5 10/21/2016 1334   CREATININE 1.09 (H) 04/05/2019 0951   CREATININE 0.9 10/21/2016 1334      Component Value Date/Time   CALCIUM 9.5 04/05/2019 0951   CALCIUM 9.9 10/21/2016 1334   ALKPHOS 49 04/05/2019 0951   ALKPHOS 49 10/21/2016 1334   AST 19 04/05/2019 0951   AST 19 10/21/2016 1334   ALT 11 04/05/2019 0951   ALT 10 10/21/2016 1334   BILITOT 0.6 04/05/2019 0951   BILITOT 0.76 10/21/2016 1334       No results found for: LABCA2  No components  found for:  [id number]  Recent Labs  Lab 03/29/19 1055  INR 2.7    Urinalysis No results found for: COLORURINE, APPEARANCEUR, LABSPEC, PHURINE, GLUCOSEU, HGBUR, BILIRUBINUR, KETONESUR, PROTEINUR, UROBILINOGEN, NITRITE, LEUKOCYTESUR   STUDIES: No results found.    ELIGIBLE FOR AVAILABLE RESEARCH PROTOCOL: no   ASSESSMENT: 86 y.o. [place] woman status post right breast upper inner quadrant lumpectomy 04/07/2015 for a pT1c cN0, stage IA invasive ductal carcinoma, grade 1, estrogen and progesterone receptor positive, HER-2 nonamplified, with an MIB-1 of 10%.  (1) the patient opted against adjuvant radiation   (2) started anastrozole April 2017    (3) may qualify for genetics counseling  (4) severe osteopenia, denosumab/Prolia started 05/06/2016  (a) bone density 01/20/2016 showed a T score of -2.4 (no change)  (b) bone density 02/2018 shows T score of -2.4    PLAN: Candus is now 4 years out from definitive surgery for her breast cancer with no evidence of disease recurrence.  This is very favorable.  She is tolerating anastrozole well the plan is to continue that for a total of 5 years which meets next year she will "graduate" from treatment.  She is receiving denosumab/Prolia today for her significant osteopenia.  She will have 2 more doses namely 6 months and 12 months from now.  She will be due for repeat bone density in January 2022.  She knows to call for any other issue that may develop before the next visit.  Total encounter time 25 minutes.Sarajane Jews C. Lawsyn Heiler, MD  04/05/19 10:38 AM Medical Oncology and Hematology [facility] [place], [place] [address] Tel. [phone]    Fax. [phone]   I, Wilburn Mylar, am acting as scribe for Dr. Virgie Dad. Wilberta Dorvil.  I, Lurline Del MD, have reviewed the above documentation for accuracy and completeness, and I agree with the above.    *Total Encounter Time as defined by the Centers for  Medicare and Medicaid Services includes, in addition to the face-to-face time of a patient visit (documented in the note above) non-face-to-face time: obtaining and reviewing outside history, ordering and reviewing medications, tests or procedures, care coordination (communications with other health care professionals or caregivers) and documentation in the medical record.

## 2019-04-05 ENCOUNTER — Inpatient Hospital Stay: Payer: Medicare Other

## 2019-04-05 ENCOUNTER — Ambulatory Visit: Payer: Medicare Other

## 2019-04-05 ENCOUNTER — Inpatient Hospital Stay (HOSPITAL_BASED_OUTPATIENT_CLINIC_OR_DEPARTMENT_OTHER): Payer: Medicare Other | Admitting: Oncology

## 2019-04-05 ENCOUNTER — Other Ambulatory Visit: Payer: Self-pay

## 2019-04-05 ENCOUNTER — Inpatient Hospital Stay: Payer: Medicare Other | Attending: Oncology

## 2019-04-05 VITALS — BP 153/75 | HR 68 | Temp 97.4°F | Resp 18 | Ht 68.0 in | Wt 154.5 lb

## 2019-04-05 DIAGNOSIS — Z170 Estrogen receptor positive status [ER+]: Secondary | ICD-10-CM | POA: Insufficient documentation

## 2019-04-05 DIAGNOSIS — Z803 Family history of malignant neoplasm of breast: Secondary | ICD-10-CM | POA: Insufficient documentation

## 2019-04-05 DIAGNOSIS — Z79811 Long term (current) use of aromatase inhibitors: Secondary | ICD-10-CM | POA: Insufficient documentation

## 2019-04-05 DIAGNOSIS — M8580 Other specified disorders of bone density and structure, unspecified site: Secondary | ICD-10-CM

## 2019-04-05 DIAGNOSIS — I4892 Unspecified atrial flutter: Secondary | ICD-10-CM

## 2019-04-05 DIAGNOSIS — C50211 Malignant neoplasm of upper-inner quadrant of right female breast: Secondary | ICD-10-CM | POA: Insufficient documentation

## 2019-04-05 DIAGNOSIS — I129 Hypertensive chronic kidney disease with stage 1 through stage 4 chronic kidney disease, or unspecified chronic kidney disease: Secondary | ICD-10-CM | POA: Diagnosis not present

## 2019-04-05 DIAGNOSIS — N182 Chronic kidney disease, stage 2 (mild): Secondary | ICD-10-CM | POA: Insufficient documentation

## 2019-04-05 DIAGNOSIS — E7800 Pure hypercholesterolemia, unspecified: Secondary | ICD-10-CM | POA: Diagnosis not present

## 2019-04-05 DIAGNOSIS — Z79899 Other long term (current) drug therapy: Secondary | ICD-10-CM | POA: Insufficient documentation

## 2019-04-05 DIAGNOSIS — Z923 Personal history of irradiation: Secondary | ICD-10-CM | POA: Diagnosis not present

## 2019-04-05 DIAGNOSIS — F419 Anxiety disorder, unspecified: Secondary | ICD-10-CM | POA: Diagnosis not present

## 2019-04-05 DIAGNOSIS — Z7901 Long term (current) use of anticoagulants: Secondary | ICD-10-CM

## 2019-04-05 DIAGNOSIS — I495 Sick sinus syndrome: Secondary | ICD-10-CM

## 2019-04-05 DIAGNOSIS — Z87891 Personal history of nicotine dependence: Secondary | ICD-10-CM | POA: Diagnosis not present

## 2019-04-05 LAB — CBC WITH DIFFERENTIAL/PLATELET
Abs Immature Granulocytes: 0.02 10*3/uL (ref 0.00–0.07)
Basophils Absolute: 0 10*3/uL (ref 0.0–0.1)
Basophils Relative: 0 %
Eosinophils Absolute: 0.2 10*3/uL (ref 0.0–0.5)
Eosinophils Relative: 2 %
HCT: 41.1 % (ref 36.0–46.0)
Hemoglobin: 13.3 g/dL (ref 12.0–15.0)
Immature Granulocytes: 0 %
Lymphocytes Relative: 33 %
Lymphs Abs: 2.4 10*3/uL (ref 0.7–4.0)
MCH: 28.2 pg (ref 26.0–34.0)
MCHC: 32.4 g/dL (ref 30.0–36.0)
MCV: 87.3 fL (ref 80.0–100.0)
Monocytes Absolute: 0.5 10*3/uL (ref 0.1–1.0)
Monocytes Relative: 7 %
Neutro Abs: 4.2 10*3/uL (ref 1.7–7.7)
Neutrophils Relative %: 58 %
Platelets: 213 10*3/uL (ref 150–400)
RBC: 4.71 MIL/uL (ref 3.87–5.11)
RDW: 14.9 % (ref 11.5–15.5)
WBC: 7.4 10*3/uL (ref 4.0–10.5)
nRBC: 0 % (ref 0.0–0.2)

## 2019-04-05 LAB — COMPREHENSIVE METABOLIC PANEL
ALT: 11 U/L (ref 0–44)
AST: 19 U/L (ref 15–41)
Albumin: 4.2 g/dL (ref 3.5–5.0)
Alkaline Phosphatase: 49 U/L (ref 38–126)
Anion gap: 9 (ref 5–15)
BUN: 12 mg/dL (ref 8–23)
CO2: 25 mmol/L (ref 22–32)
Calcium: 9.5 mg/dL (ref 8.9–10.3)
Chloride: 109 mmol/L (ref 98–111)
Creatinine, Ser: 1.09 mg/dL — ABNORMAL HIGH (ref 0.44–1.00)
GFR calc Af Amer: 54 mL/min — ABNORMAL LOW (ref >60)
GFR calc non Af Amer: 47 mL/min — ABNORMAL LOW (ref >60)
Glucose, Bld: 89 mg/dL (ref 70–99)
Potassium: 3.8 mmol/L (ref 3.5–5.1)
Sodium: 143 mmol/L (ref 135–145)
Total Bilirubin: 0.6 mg/dL (ref 0.3–1.2)
Total Protein: 7.1 g/dL (ref 6.5–8.1)

## 2019-04-05 MED ORDER — DENOSUMAB 60 MG/ML ~~LOC~~ SOSY
60.0000 mg | PREFILLED_SYRINGE | Freq: Once | SUBCUTANEOUS | Status: AC
  Administered 2019-04-05: 60 mg via SUBCUTANEOUS
  Filled 2019-04-05: qty 1

## 2019-04-05 MED ORDER — DENOSUMAB 60 MG/ML ~~LOC~~ SOSY
PREFILLED_SYRINGE | SUBCUTANEOUS | Status: AC
  Filled 2019-04-05: qty 1

## 2019-04-05 NOTE — Patient Instructions (Signed)
Denosumab injection What is this medicine? DENOSUMAB (den oh sue mab) slows bone breakdown. Prolia is used to treat osteoporosis in women after menopause and in men, and in people who are taking corticosteroids for 6 months or more. Xgeva is used to treat a high calcium level due to cancer and to prevent bone fractures and other bone problems caused by multiple myeloma or cancer bone metastases. Xgeva is also used to treat giant cell tumor of the bone. This medicine may be used for other purposes; ask your health care provider or pharmacist if you have questions. COMMON BRAND NAME(S): Prolia, XGEVA What should I tell my health care provider before I take this medicine? They need to know if you have any of these conditions:  dental disease  having surgery or tooth extraction  infection  kidney disease  low levels of calcium or Vitamin D in the blood  malnutrition  on hemodialysis  skin conditions or sensitivity  thyroid or parathyroid disease  an unusual reaction to denosumab, other medicines, foods, dyes, or preservatives  pregnant or trying to get pregnant  breast-feeding How should I use this medicine? This medicine is for injection under the skin. It is given by a health care professional in a hospital or clinic setting. A special MedGuide will be given to you before each treatment. Be sure to read this information carefully each time. For Prolia, talk to your pediatrician regarding the use of this medicine in children. Special care may be needed. For Xgeva, talk to your pediatrician regarding the use of this medicine in children. While this drug may be prescribed for children as young as 13 years for selected conditions, precautions do apply. Overdosage: If you think you have taken too much of this medicine contact a poison control center or emergency room at once. NOTE: This medicine is only for you. Do not share this medicine with others. What if I miss a dose? It is  important not to miss your dose. Call your doctor or health care professional if you are unable to keep an appointment. What may interact with this medicine? Do not take this medicine with any of the following medications:  other medicines containing denosumab This medicine may also interact with the following medications:  medicines that lower your chance of fighting infection  steroid medicines like prednisone or cortisone This list may not describe all possible interactions. Give your health care provider a list of all the medicines, herbs, non-prescription drugs, or dietary supplements you use. Also tell them if you smoke, drink alcohol, or use illegal drugs. Some items may interact with your medicine. What should I watch for while using this medicine? Visit your doctor or health care professional for regular checks on your progress. Your doctor or health care professional may order blood tests and other tests to see how you are doing. Call your doctor or health care professional for advice if you get a fever, chills or sore throat, or other symptoms of a cold or flu. Do not treat yourself. This drug may decrease your body's ability to fight infection. Try to avoid being around people who are sick. You should make sure you get enough calcium and vitamin D while you are taking this medicine, unless your doctor tells you not to. Discuss the foods you eat and the vitamins you take with your health care professional. See your dentist regularly. Brush and floss your teeth as directed. Before you have any dental work done, tell your dentist you are   receiving this medicine. Do not become pregnant while taking this medicine or for 5 months after stopping it. Talk with your doctor or health care professional about your birth control options while taking this medicine. Women should inform their doctor if they wish to become pregnant or think they might be pregnant. There is a potential for serious side  effects to an unborn child. Talk to your health care professional or pharmacist for more information. What side effects may I notice from receiving this medicine? Side effects that you should report to your doctor or health care professional as soon as possible:  allergic reactions like skin rash, itching or hives, swelling of the face, lips, or tongue  bone pain  breathing problems  dizziness  jaw pain, especially after dental work  redness, blistering, peeling of the skin  signs and symptoms of infection like fever or chills; cough; sore throat; pain or trouble passing urine  signs of low calcium like fast heartbeat, muscle cramps or muscle pain; pain, tingling, numbness in the hands or feet; seizures  unusual bleeding or bruising  unusually weak or tired Side effects that usually do not require medical attention (report to your doctor or health care professional if they continue or are bothersome):  constipation  diarrhea  headache  joint pain  loss of appetite  muscle pain  runny nose  tiredness  upset stomach This list may not describe all possible side effects. Call your doctor for medical advice about side effects. You may report side effects to FDA at 1-800-FDA-1088. Where should I keep my medicine? This medicine is only given in a clinic, doctor's office, or other health care setting and will not be stored at home. NOTE: This sheet is a summary. It may not cover all possible information. If you have questions about this medicine, talk to your doctor, pharmacist, or health care provider.  2020 Elsevier/Gold Standard (2017-05-27 16:10:44)

## 2019-04-06 ENCOUNTER — Telehealth: Payer: Self-pay | Admitting: Oncology

## 2019-04-06 NOTE — Telephone Encounter (Signed)
I could not reach patient regarding schedule will mail 

## 2019-05-17 ENCOUNTER — Other Ambulatory Visit: Payer: Self-pay

## 2019-05-17 ENCOUNTER — Ambulatory Visit (INDEPENDENT_AMBULATORY_CARE_PROVIDER_SITE_OTHER): Payer: Medicare Other | Admitting: *Deleted

## 2019-05-17 DIAGNOSIS — Z5181 Encounter for therapeutic drug level monitoring: Secondary | ICD-10-CM

## 2019-05-17 LAB — POCT INR: INR: 2.9 (ref 2.0–3.0)

## 2019-05-17 NOTE — Patient Instructions (Signed)
Description   °Continue on same dosage 1.5 tablets (6mg) daily. Recheck INR in 8 weeks. [facility]madin Clinic [phone] °  ° ° °

## 2019-07-10 ENCOUNTER — Other Ambulatory Visit: Payer: Self-pay | Admitting: Interventional Cardiology

## 2019-07-12 ENCOUNTER — Other Ambulatory Visit: Payer: Self-pay

## 2019-07-12 ENCOUNTER — Ambulatory Visit (INDEPENDENT_AMBULATORY_CARE_PROVIDER_SITE_OTHER): Payer: Medicare Other | Admitting: *Deleted

## 2019-07-12 DIAGNOSIS — Z5181 Encounter for therapeutic drug level monitoring: Secondary | ICD-10-CM

## 2019-07-12 DIAGNOSIS — H40051 Ocular hypertension, right eye: Secondary | ICD-10-CM | POA: Diagnosis not present

## 2019-07-12 DIAGNOSIS — H349 Unspecified retinal vascular occlusion: Secondary | ICD-10-CM | POA: Diagnosis not present

## 2019-07-12 DIAGNOSIS — H35373 Puckering of macula, bilateral: Secondary | ICD-10-CM | POA: Diagnosis not present

## 2019-07-12 LAB — POCT INR: INR: 3.1 — AB (ref 2.0–3.0)

## 2019-07-12 NOTE — Patient Instructions (Addendum)
Description   Today take 1 tablet of Warfarin then continue on same dosage 1.5 tablets (6mg ) daily. Recheck INR in 6 weeks (normally 8wks). [facility]in Clinic [phone]

## 2019-08-05 ENCOUNTER — Other Ambulatory Visit: Payer: Self-pay | Admitting: Oncology

## 2019-08-23 ENCOUNTER — Other Ambulatory Visit: Payer: Self-pay

## 2019-08-23 ENCOUNTER — Ambulatory Visit (INDEPENDENT_AMBULATORY_CARE_PROVIDER_SITE_OTHER): Payer: Medicare Other | Admitting: Pharmacist

## 2019-08-23 DIAGNOSIS — Z5181 Encounter for therapeutic drug level monitoring: Secondary | ICD-10-CM

## 2019-08-23 LAB — POCT INR: INR: 2.7 (ref 2.0–3.0)

## 2019-08-23 NOTE — Patient Instructions (Signed)
Description   °Continue on same dosage 1.5 tablets (6mg) daily. Recheck INR in 8 weeks. [facility]madin Clinic [phone] °  ° ° °

## 2019-08-27 DIAGNOSIS — Z0000 Encounter for general adult medical examination without abnormal findings: Secondary | ICD-10-CM | POA: Diagnosis not present

## 2019-08-27 DIAGNOSIS — I059 Rheumatic mitral valve disease, unspecified: Secondary | ICD-10-CM | POA: Diagnosis not present

## 2019-08-27 DIAGNOSIS — F330 Major depressive disorder, recurrent, mild: Secondary | ICD-10-CM | POA: Diagnosis not present

## 2019-08-27 DIAGNOSIS — I779 Disorder of arteries and arterioles, unspecified: Secondary | ICD-10-CM | POA: Diagnosis not present

## 2019-08-27 DIAGNOSIS — M810 Age-related osteoporosis without current pathological fracture: Secondary | ICD-10-CM | POA: Diagnosis not present

## 2019-08-27 DIAGNOSIS — Z79899 Other long term (current) drug therapy: Secondary | ICD-10-CM | POA: Diagnosis not present

## 2019-08-27 DIAGNOSIS — I10 Essential (primary) hypertension: Secondary | ICD-10-CM | POA: Diagnosis not present

## 2019-08-27 DIAGNOSIS — C50919 Malignant neoplasm of unspecified site of unspecified female breast: Secondary | ICD-10-CM | POA: Diagnosis not present

## 2019-08-27 DIAGNOSIS — G479 Sleep disorder, unspecified: Secondary | ICD-10-CM | POA: Diagnosis not present

## 2019-08-27 DIAGNOSIS — I4891 Unspecified atrial fibrillation: Secondary | ICD-10-CM | POA: Diagnosis not present

## 2019-08-27 DIAGNOSIS — D6869 Other thrombophilia: Secondary | ICD-10-CM | POA: Diagnosis not present

## 2019-08-27 DIAGNOSIS — E785 Hyperlipidemia, unspecified: Secondary | ICD-10-CM | POA: Diagnosis not present

## 2019-10-04 ENCOUNTER — Other Ambulatory Visit: Payer: Self-pay | Admitting: *Deleted

## 2019-10-04 DIAGNOSIS — C50211 Malignant neoplasm of upper-inner quadrant of right female breast: Secondary | ICD-10-CM

## 2019-10-05 ENCOUNTER — Other Ambulatory Visit: Payer: Self-pay

## 2019-10-05 ENCOUNTER — Inpatient Hospital Stay: Payer: Medicare Other | Attending: Oncology

## 2019-10-05 ENCOUNTER — Inpatient Hospital Stay: Payer: Medicare Other

## 2019-10-05 VITALS — BP 166/85 | HR 53 | Temp 98.9°F | Resp 18

## 2019-10-05 DIAGNOSIS — Z170 Estrogen receptor positive status [ER+]: Secondary | ICD-10-CM | POA: Insufficient documentation

## 2019-10-05 DIAGNOSIS — Z79899 Other long term (current) drug therapy: Secondary | ICD-10-CM | POA: Diagnosis not present

## 2019-10-05 DIAGNOSIS — C50211 Malignant neoplasm of upper-inner quadrant of right female breast: Secondary | ICD-10-CM | POA: Diagnosis not present

## 2019-10-05 DIAGNOSIS — M8580 Other specified disorders of bone density and structure, unspecified site: Secondary | ICD-10-CM | POA: Insufficient documentation

## 2019-10-05 LAB — CMP (CANCER CENTER ONLY)
ALT: 9 U/L (ref 0–44)
AST: 18 U/L (ref 15–41)
Albumin: 4 g/dL (ref 3.5–5.0)
Alkaline Phosphatase: 50 U/L (ref 38–126)
Anion gap: 9 (ref 5–15)
BUN: 14 mg/dL (ref 8–23)
CO2: 28 mmol/L (ref 22–32)
Calcium: 9.9 mg/dL (ref 8.9–10.3)
Chloride: 107 mmol/L (ref 98–111)
Creatinine: 1.1 mg/dL — ABNORMAL HIGH (ref 0.44–1.00)
GFR, Est AFR Am: 53 mL/min — ABNORMAL LOW (ref >60)
GFR, Estimated: 46 mL/min — ABNORMAL LOW (ref >60)
Glucose, Bld: 90 mg/dL (ref 70–99)
Potassium: 4 mmol/L (ref 3.5–5.1)
Sodium: 144 mmol/L (ref 135–145)
Total Bilirubin: 0.5 mg/dL (ref 0.3–1.2)
Total Protein: 7 g/dL (ref 6.5–8.1)

## 2019-10-05 LAB — CBC WITH DIFFERENTIAL (CANCER CENTER ONLY)
Abs Immature Granulocytes: 0.03 10*3/uL (ref 0.00–0.07)
Basophils Absolute: 0 10*3/uL (ref 0.0–0.1)
Basophils Relative: 0 %
Eosinophils Absolute: 0.1 10*3/uL (ref 0.0–0.5)
Eosinophils Relative: 2 %
HCT: 40.1 % (ref 36.0–46.0)
Hemoglobin: 12.7 g/dL (ref 12.0–15.0)
Immature Granulocytes: 0 %
Lymphocytes Relative: 30 %
Lymphs Abs: 2.2 10*3/uL (ref 0.7–4.0)
MCH: 28 pg (ref 26.0–34.0)
MCHC: 31.7 g/dL (ref 30.0–36.0)
MCV: 88.3 fL (ref 80.0–100.0)
Monocytes Absolute: 0.5 10*3/uL (ref 0.1–1.0)
Monocytes Relative: 7 %
Neutro Abs: 4.5 10*3/uL (ref 1.7–7.7)
Neutrophils Relative %: 61 %
Platelet Count: 203 10*3/uL (ref 150–400)
RBC: 4.54 MIL/uL (ref 3.87–5.11)
RDW: 15.3 % (ref 11.5–15.5)
WBC Count: 7.4 10*3/uL (ref 4.0–10.5)
nRBC: 0 % (ref 0.0–0.2)

## 2019-10-05 MED ORDER — DENOSUMAB 60 MG/ML ~~LOC~~ SOSY
PREFILLED_SYRINGE | SUBCUTANEOUS | Status: AC
  Filled 2019-10-05: qty 1

## 2019-10-05 MED ORDER — DENOSUMAB 60 MG/ML ~~LOC~~ SOSY
60.0000 mg | PREFILLED_SYRINGE | Freq: Once | SUBCUTANEOUS | Status: AC
  Administered 2019-10-05: 60 mg via SUBCUTANEOUS
  Filled 2019-10-05: qty 1

## 2019-10-05 NOTE — Patient Instructions (Signed)
Denosumab injection °What is this medicine? °DENOSUMAB (den oh sue mab) slows bone breakdown. Prolia is used to treat osteoporosis in women after menopause and in men, and in people who are taking corticosteroids for 6 months or more. Xgeva is used to treat a high calcium level due to cancer and to prevent bone fractures and other bone problems caused by multiple myeloma or cancer bone metastases. Xgeva is also used to treat giant cell tumor of the bone. °This medicine may be used for other purposes; ask your health care provider or pharmacist if you have questions. °COMMON BRAND NAME(S): Prolia, XGEVA °What should I tell my health care provider before I take this medicine? °They need to know if you have any of these conditions: °· dental disease °· having surgery or tooth extraction °· infection °· kidney disease °· low levels of calcium or Vitamin D in the blood °· malnutrition °· on hemodialysis °· skin conditions or sensitivity °· thyroid or parathyroid disease °· an unusual reaction to denosumab, other medicines, foods, dyes, or preservatives °· pregnant or trying to get pregnant °· breast-feeding °How should I use this medicine? °This medicine is for injection under the skin. It is given by a health care professional in a hospital or clinic setting. °A special MedGuide will be given to you before each treatment. Be sure to read this information carefully each time. °For Prolia, talk to your pediatrician regarding the use of this medicine in children. Special care may be needed. For Xgeva, talk to your pediatrician regarding the use of this medicine in children. While this drug may be prescribed for children as young as 13 years for selected conditions, precautions do apply. °Overdosage: If you think you have taken too much of this medicine contact a poison control center or emergency room at once. °NOTE: This medicine is only for you. Do not share this medicine with others. °What if I miss a dose? °It is  important not to miss your dose. Call your doctor or health care professional if you are unable to keep an appointment. °What may interact with this medicine? °Do not take this medicine with any of the following medications: °· other medicines containing denosumab °This medicine may also interact with the following medications: °· medicines that lower your chance of fighting infection °· steroid medicines like prednisone or cortisone °This list may not describe all possible interactions. Give your health care provider a list of all the medicines, herbs, non-prescription drugs, or dietary supplements you use. Also tell them if you smoke, drink alcohol, or use illegal drugs. Some items may interact with your medicine. °What should I watch for while using this medicine? °Visit your doctor or health care professional for regular checks on your progress. Your doctor or health care professional may order blood tests and other tests to see how you are doing. °Call your doctor or health care professional for advice if you get a fever, chills or sore throat, or other symptoms of a cold or flu. Do not treat yourself. This drug may decrease your body's ability to fight infection. Try to avoid being around people who are sick. °You should make sure you get enough calcium and vitamin D while you are taking this medicine, unless your doctor tells you not to. Discuss the foods you eat and the vitamins you take with your health care professional. °See your dentist regularly. Brush and floss your teeth as directed. Before you have any dental work done, tell your dentist you are   receiving this medicine. Do not become pregnant while taking this medicine or for 5 months after stopping it. Talk with your doctor or health care professional about your birth control options while taking this medicine. Women should inform their doctor if they wish to become pregnant or think they might be pregnant. There is a potential for serious side  effects to an unborn child. Talk to your health care professional or pharmacist for more information. What side effects may I notice from receiving this medicine? Side effects that you should report to your doctor or health care professional as soon as possible:  allergic reactions like skin rash, itching or hives, swelling of the face, lips, or tongue  bone pain  breathing problems  dizziness  jaw pain, especially after dental work  redness, blistering, peeling of the skin  signs and symptoms of infection like fever or chills; cough; sore throat; pain or trouble passing urine  signs of low calcium like fast heartbeat, muscle cramps or muscle pain; pain, tingling, numbness in the hands or feet; seizures  unusual bleeding or bruising  unusually weak or tired Side effects that usually do not require medical attention (report to your doctor or health care professional if they continue or are bothersome):  constipation  diarrhea  headache  joint pain  loss of appetite  muscle pain  runny nose  tiredness  upset stomach This list may not describe all possible side effects. Call your doctor for medical advice about side effects. You may report side effects to FDA at 1-800-FDA-1088. Where should I keep my medicine? This medicine is only given in a clinic, doctor's office, or other health care setting and will not be stored at home. NOTE: This sheet is a summary. It may not cover all possible information. If you have questions about this medicine, talk to your doctor, pharmacist, or health care provider.  2020 Elsevier/Gold Standard (2017-05-27 16:10:44)

## 2019-10-18 ENCOUNTER — Other Ambulatory Visit: Payer: Self-pay

## 2019-10-18 ENCOUNTER — Ambulatory Visit (INDEPENDENT_AMBULATORY_CARE_PROVIDER_SITE_OTHER): Payer: Medicare Other

## 2019-10-18 DIAGNOSIS — Z7901 Long term (current) use of anticoagulants: Secondary | ICD-10-CM

## 2019-10-18 DIAGNOSIS — I4892 Unspecified atrial flutter: Secondary | ICD-10-CM

## 2019-10-18 DIAGNOSIS — Z5181 Encounter for therapeutic drug level monitoring: Secondary | ICD-10-CM | POA: Diagnosis not present

## 2019-10-18 LAB — POCT INR: INR: 3 (ref 2.0–3.0)

## 2019-10-18 NOTE — Patient Instructions (Signed)
Description   Continue on same dosage 1.5 tablets (6mg ) daily. Recheck INR in 8 weeks. Coumadin Clinic [phone]

## 2019-11-09 DIAGNOSIS — Z23 Encounter for immunization: Secondary | ICD-10-CM | POA: Diagnosis not present

## 2019-11-13 DIAGNOSIS — Z23 Encounter for immunization: Secondary | ICD-10-CM | POA: Diagnosis not present

## 2019-11-30 DIAGNOSIS — G4700 Insomnia, unspecified: Secondary | ICD-10-CM | POA: Diagnosis not present

## 2019-11-30 DIAGNOSIS — I10 Essential (primary) hypertension: Secondary | ICD-10-CM | POA: Diagnosis not present

## 2019-12-13 ENCOUNTER — Ambulatory Visit (INDEPENDENT_AMBULATORY_CARE_PROVIDER_SITE_OTHER): Payer: Medicare Other | Admitting: *Deleted

## 2019-12-13 ENCOUNTER — Other Ambulatory Visit: Payer: Self-pay

## 2019-12-13 DIAGNOSIS — Z5181 Encounter for therapeutic drug level monitoring: Secondary | ICD-10-CM

## 2019-12-13 LAB — POCT INR: INR: 3.2 — AB (ref 2.0–3.0)

## 2019-12-13 NOTE — Patient Instructions (Addendum)
Description   Take 1 tablet today then continue taking 1.5 tablets (6mg ) daily. Recheck INR in 5 weeks (normally 8 weeks). [facility]madin Clinic [phone]

## 2019-12-25 ENCOUNTER — Other Ambulatory Visit: Payer: Self-pay | Admitting: Oncology

## 2019-12-25 DIAGNOSIS — C50211 Malignant neoplasm of upper-inner quadrant of right female breast: Secondary | ICD-10-CM

## 2019-12-25 DIAGNOSIS — I4892 Unspecified atrial flutter: Secondary | ICD-10-CM

## 2019-12-25 DIAGNOSIS — M8580 Other specified disorders of bone density and structure, unspecified site: Secondary | ICD-10-CM

## 2019-12-25 DIAGNOSIS — Z170 Estrogen receptor positive status [ER+]: Secondary | ICD-10-CM

## 2019-12-25 DIAGNOSIS — Z7901 Long term (current) use of anticoagulants: Secondary | ICD-10-CM

## 2019-12-25 DIAGNOSIS — I495 Sick sinus syndrome: Secondary | ICD-10-CM

## 2019-12-25 DIAGNOSIS — Z853 Personal history of malignant neoplasm of breast: Secondary | ICD-10-CM

## 2020-01-04 ENCOUNTER — Other Ambulatory Visit: Payer: Self-pay | Admitting: Interventional Cardiology

## 2020-01-17 ENCOUNTER — Other Ambulatory Visit: Payer: Self-pay

## 2020-01-17 ENCOUNTER — Ambulatory Visit (INDEPENDENT_AMBULATORY_CARE_PROVIDER_SITE_OTHER): Payer: Medicare Other | Admitting: *Deleted

## 2020-01-17 DIAGNOSIS — Z5181 Encounter for therapeutic drug level monitoring: Secondary | ICD-10-CM | POA: Diagnosis not present

## 2020-01-17 LAB — POCT INR: INR: 2.5 (ref 2.0–3.0)

## 2020-01-17 NOTE — Patient Instructions (Addendum)
Description   Continue taking Warfarin 1.5 tablets (6mg ) daily. Recheck INR in 7 weeks (normally 8 weeks). Coumadin Clinic [phone]

## 2020-01-27 ENCOUNTER — Other Ambulatory Visit: Payer: Self-pay | Admitting: Interventional Cardiology

## 2020-02-02 NOTE — Progress Notes (Signed)
Cardiology Office Note:    Date:  02/07/2020   ID:  Martha Edwards, DOB [birth year], MRN [id number]  PCP:  [name], MD  Cardiologist:  Sinclair Grooms, MD   Referring MD: [name], MD   Chief Complaint  Patient presents with  . Atrial Fibrillation  . Cardiac Valve Problem    History of Present Illness:    Martha Edwards is a 87 y.o. female with a hx of tachybradycardia syndrome, PAF, hypertension, systolic right upper sternal border murmur, and chronic anticoagulation therapy.  Timica feels okay.  She says her memory is not as good as he used to be.  She denies shortness of breath, chest pain, edema, and orthopnea.  She is not aware that I have ever mentioned a heart murmur to her.  I reviewed the prior echo when she had mild aortic valve calcification with a 14 mm gradient and mild aortic regurgitation.  This was performed in 2019.  Past Medical History:  Diagnosis Date  . Anticoagulated   . Anxiety   . Arthritis of hand   . Atrial flutter ([place])    with LVEF 60%, LVH, LAE and holter with rate control and Ave HR 60 bpm   . Carotid artery plaque    mild bilateral carotid plaque without significant stenosis (<20% in left ICA), 04/2010  . CKD (chronic kidney disease) stage 2, GFR 60-89 ml/min   . Diverticula, colon   . Family history of breast cancer   . Family history of prostate cancer   . Hypercholesterolemia   . Hypertension    with LVH on echo 06/03/11  . Hypertonicity of bladder   . Insomnia   . Mitral regurgitation   . Osteopenia    > osteoporosis  . Overactive bladder     Past Surgical History:  Procedure Laterality Date  . BREAST BIOPSY    . BREAST LUMPECTOMY Right    2017  . BREAST LUMPECTOMY WITH RADIOACTIVE SEED LOCALIZATION Right 04/07/2015   Procedure: RIGHT BREAST LUMPECTOMY WITH RADIOACTIVE SEED LOCALIZATION;  Surgeon: Excell Seltzer, MD;  Location: [facility];  Service: General;  Laterality: Right;  . COLONOSCOPY  2001    2007 sigmoidoscopy     Current Medications: Current Meds  Medication Sig  . acetaminophen (TYLENOL) 325 MG tablet Take 650 mg by mouth every 6 (six) hours as needed for mild pain.  Marland Kitchen amLODipine (NORVASC) 2.5 MG tablet TAKE 1 TABLET DAILY. NEEDS FOLLOW UP APPOINTMENT FOR FURTHER REFILLS. 1ST ATTEMPT.  Marland Kitchen anastrozole (ARIMIDEX) 1 MG tablet TAKE 1 TABLET BY MOUTH EVERY DAY  . atorvastatin (LIPITOR) 40 MG tablet Take 40 mg by mouth daily.  Marland Kitchen CALCIUM PO Take 1 tablet by mouth daily.  . Cholecalciferol (VITAMIN D3 PO) Take 1 tablet by mouth daily.  . Denosumab (PROLIA [place]) Take as directed once every 6 months.  . metoprolol succinate (TOPROL-XL) 25 MG 24 hr tablet Take 12.5 mg by mouth daily.  . mirtazapine (REMERON) 15 MG tablet Take 15 mg by mouth at bedtime.  Marland Kitchen warfarin (COUMADIN) 4 MG tablet TAKE AS DIRECTED BY COUMADIN CLINIC **90 DAY SUPPLY     Allergies:   Patient has no known allergies.   Social History   Socioeconomic History  . Marital status: Single    Spouse name: Not on file  . Number of children: Not on file  . Years of education: Not on file  . Highest education level: Not on file  Occupational History  .  Not on file  Tobacco Use  . Smoking status: Former Smoker    Quit date: 02/02/1984    Years since quitting: 36.0  . Smokeless tobacco: Never Used  Substance and Sexual Activity  . Alcohol use: Not on file  . Drug use: Not on file  . Sexual activity: Never  Other Topics Concern  . Not on file  Social History Narrative  . Not on file   Social Determinants of Health   Financial Resource Strain: Not on file  Food Insecurity: Not on file  Transportation Needs: Not on file  Physical Activity: Not on file  Stress: Not on file  Social Connections: Not on file     Family History: The patient's family history includes Breast cancer (age of onset: 41) in her sister; Diabetes in her mother; Liver cancer in her mother; Prostate cancer (age of onset: 16) in her  brother. There is no history of Heart attack or Stroke.  ROS:   Please see the history of present illness.    No blood in the urine or stool.  Has not been physically active due to the Covid environment.  All other systems reviewed and are negative.  EKGs/Labs/Other Studies Reviewed:    The following studies were reviewed today: No new data.  Reference 2019 echocardiogram.  Report was reviewed.  EKG:  EKG sinus bradycardia, first-degree AV block, biatrial abnormality.  Recent Labs: 10/05/2019: ALT 9; BUN 14; Creatinine 1.10; Hemoglobin 12.7; Platelet Count 203; Potassium 4.0; Sodium 144  Recent Lipid Panel No results found for: CHOL, TRIG, HDL, CHOLHDL, VLDL, LDLCALC, LDLDIRECT  Physical Exam:    VS:  BP (!) 164/76   Pulse (!) 55   Ht 5\' 8"  (1.727 m)   Wt 154 lb 9.6 oz (70.1 kg)   SpO2 95%   BMI 23.51 kg/m     Wt Readings from Last 3 Encounters:  02/07/20 154 lb 9.6 oz (70.1 kg)  04/05/19 154 lb 8 oz (70.1 kg)  12/21/18 159 lb 12.8 oz (72.5 kg)     GEN: Appears younger than stated age. No acute distress HEENT: Normal NECK: No JVD. LYMPHATICS: No lymphadenopathy CARDIAC: 2/6 right upper sternal crescendo decrescendo systolic murmur. RRR without gallop, or edema. VASCULAR:  Normal Pulses. No bruits. RESPIRATORY:  Clear to auscultation without rales, wheezing or rhonchi  ABDOMEN: Soft, non-tender, non-distended, No pulsatile mass, MUSCULOSKELETAL: No deformity  SKIN: Warm and dry NEUROLOGIC:  Alert and oriented x 3 PSYCHIATRIC:  Normal affect   ASSESSMENT:    1. Paroxysmal atrial fibrillation (HCC)   2. Chronic anticoagulation   3. Nonrheumatic aortic valve stenosis   4. Tachy-brady syndrome ([place])   5. Essential hypertension   6. Hyperlipidemia LDL goal <70   7. Educated about COVID-19 virus infection    PLAN:    In order of problems listed above:  1. Currently in sinus rhythm 2. Continue Coumadin therapy 3. 2D doppler echo 4. Stable without  symptoms. 5. Less than 3 g sodium diet.  Resume physical activity.  She is stop walking due to Covid pandemic. 6. Continue to monitor. 7. Vaccinated and practicing mitigation.   1 year f/u.  Medication Adjustments/Labs and Tests Ordered: Current medicines are reviewed at length with the patient today.  Concerns regarding medicines are outlined above.  Orders Placed This Encounter  Procedures  . EKG 12-Lead   No orders of the defined types were placed in this encounter.   There are no Patient Instructions on file for this visit.  Signed, Lesleigh Noe, MD  02/07/2020 11:22 AM    [facility]

## 2020-02-07 ENCOUNTER — Other Ambulatory Visit: Payer: Self-pay

## 2020-02-07 ENCOUNTER — Ambulatory Visit (INDEPENDENT_AMBULATORY_CARE_PROVIDER_SITE_OTHER): Payer: Medicare Other | Admitting: Interventional Cardiology

## 2020-02-07 ENCOUNTER — Encounter: Payer: Self-pay | Admitting: Interventional Cardiology

## 2020-02-07 VITALS — BP 164/76 | HR 55 | Ht 68.0 in | Wt 154.6 lb

## 2020-02-07 DIAGNOSIS — Z7901 Long term (current) use of anticoagulants: Secondary | ICD-10-CM | POA: Diagnosis not present

## 2020-02-07 DIAGNOSIS — I495 Sick sinus syndrome: Secondary | ICD-10-CM

## 2020-02-07 DIAGNOSIS — I480 Paroxysmal atrial fibrillation: Secondary | ICD-10-CM

## 2020-02-07 DIAGNOSIS — Z7189 Other specified counseling: Secondary | ICD-10-CM | POA: Diagnosis not present

## 2020-02-07 DIAGNOSIS — I10 Essential (primary) hypertension: Secondary | ICD-10-CM | POA: Diagnosis not present

## 2020-02-07 DIAGNOSIS — I350 Nonrheumatic aortic (valve) stenosis: Secondary | ICD-10-CM | POA: Diagnosis not present

## 2020-02-07 DIAGNOSIS — E785 Hyperlipidemia, unspecified: Secondary | ICD-10-CM

## 2020-02-07 NOTE — Patient Instructions (Signed)
Medication Instructions:  Your physician recommends that you continue on your current medications as directed. Please refer to the Current Medication list given to you today.  *If you need a refill on your cardiac medications before your next appointment, please call your pharmacy*   Lab Work: None If you have labs (blood work) drawn today and your tests are completely normal, you will receive your results only by: . MyChart Message (if you have MyChart) OR . A paper copy in the mail If you have any lab test that is abnormal or we need to change your treatment, we will call you to review the results.   Testing/Procedures: Your physician has requested that you have an echocardiogram. Echocardiography is a painless test that uses sound waves to create images of your heart. It provides your doctor with information about the size and shape of your heart and how well your heart's chambers and valves are working. This procedure takes approximately one hour. There are no restrictions for this procedure.   Follow-Up: At [facility], you and your health needs are our priority.  As part of our continuing mission to provide you with exceptional heart care, we have created designated Provider Care Teams.  These Care Teams include your primary Cardiologist (physician) and Advanced Practice Providers (APPs -  Physician Assistants and Nurse Practitioners) who all work together to provide you with the care you need, when you need it.  We recommend signing up for the patient portal called "MyChart".  Sign up information is provided on this After Visit Summary.  MyChart is used to connect with patients for Virtual Visits (Telemedicine).  Patients are able to view lab/test results, encounter notes, upcoming appointments, etc.  Non-urgent messages can be sent to your provider as well.   To learn more about what you can do with MyChart, go to [email or web address].    Your next appointment:   1  year(s)  The format for your next appointment:   In Person  Provider:   You may see Henry W Smith III, MD or one of the following Advanced Practice Providers on your designated Care Team:    Lori Gerhardt, NP  Laura Ingold, NP  Jill McDaniel, NP    Other Instructions   

## 2020-02-12 ENCOUNTER — Ambulatory Visit
Admission: RE | Admit: 2020-02-12 | Discharge: 2020-02-12 | Disposition: A | Discharge status: Home / Self Care | Payer: Medicare Other | Source: Ambulatory Visit | Attending: Oncology | Admitting: Oncology

## 2020-02-12 ENCOUNTER — Other Ambulatory Visit: Payer: Self-pay

## 2020-02-12 DIAGNOSIS — R922 Inconclusive mammogram: Secondary | ICD-10-CM | POA: Diagnosis not present

## 2020-02-12 DIAGNOSIS — Z853 Personal history of malignant neoplasm of breast: Secondary | ICD-10-CM

## 2020-02-20 ENCOUNTER — Other Ambulatory Visit: Payer: Self-pay | Admitting: Interventional Cardiology

## 2020-02-21 ENCOUNTER — Ambulatory Visit (HOSPITAL_COMMUNITY): Payer: Medicare Other | Attending: Internal Medicine

## 2020-02-21 ENCOUNTER — Other Ambulatory Visit: Payer: Self-pay

## 2020-02-21 DIAGNOSIS — I350 Nonrheumatic aortic (valve) stenosis: Secondary | ICD-10-CM | POA: Diagnosis not present

## 2020-02-21 LAB — ECHOCARDIOGRAM COMPLETE
AR max vel: 0.91 cm2
AV Area VTI: 1.01 cm2
AV Area mean vel: 0.86 cm2
AV Mean grad: 18 mmHg
AV Peak grad: 31.8 mmHg
Ao pk vel: 2.82 m/s
Area-P 1/2: 2.17 cm2
MV VTI: 1.41 cm2
P 1/2 time: 535 msec
S' Lateral: 2.8 cm

## 2020-03-06 ENCOUNTER — Ambulatory Visit (INDEPENDENT_AMBULATORY_CARE_PROVIDER_SITE_OTHER): Payer: Medicare Other | Admitting: *Deleted

## 2020-03-06 ENCOUNTER — Other Ambulatory Visit: Payer: Self-pay

## 2020-03-06 DIAGNOSIS — Z5181 Encounter for therapeutic drug level monitoring: Secondary | ICD-10-CM | POA: Diagnosis not present

## 2020-03-06 LAB — POCT INR: INR: 2.9 (ref 2.0–3.0)

## 2020-03-06 NOTE — Patient Instructions (Signed)
Description   Continue taking Warfarin 1.5 tablets (6mg) daily. Recheck INR in 8 weeks. [facility]in Clinic [phone]     

## 2020-03-28 ENCOUNTER — Other Ambulatory Visit: Payer: Self-pay | Admitting: Interventional Cardiology

## 2020-03-31 ENCOUNTER — Other Ambulatory Visit: Payer: Self-pay

## 2020-03-31 DIAGNOSIS — C50211 Malignant neoplasm of upper-inner quadrant of right female breast: Secondary | ICD-10-CM

## 2020-04-01 NOTE — Progress Notes (Signed)
[facility]  [phone] [phone]    ID: Martha Edwards DOB: 12/16/1932  MR#: [id number]  WCB#:[id number]  Patient Care Team: [name], MD as PCP - General (Family Medicine) Belva Crome, MD as PCP - Cardiology (Cardiology) Excell Seltzer, MD (Inactive) as Consulting Physician (General Surgery) Jenel Gierke, Virgie Dad, MD as Consulting Physician (Oncology) Eppie Gibson, MD as Attending Physician (Radiation Oncology) OTHER MD:   CHIEF COMPLAINT: Estrogen receptor positive breast cancer  CURRENT TREATMENT: Completing 5 years of anastrozole; receiving her final dose of denosumab/Prolia   INTERVAL HISTORY: Martha Edwards returns today for follow-up of her estrogen receptor positive breast cancer.   She continues on anastrozole.  She tolerates this well with no hot flashes, vaginal dryness or other issues.  Arthralgias or myalgias have not been an issue  She also receives Prolia/denosumab, with a dose due today.  She has had no complications from this treatment that she is aware of  Her most recent bone density screening on 02/15/2018 showed a T-score of -2.4. She is scheduled for repeat on 04/08/2020.  Since her last visit, she underwent bilateral diagnostic mammography with tomography at [facility] on 02/12/2020 showing: breast density category C; no evidence of malignancy in either breast.    REVIEW OF SYSTEMS: Martha Edwards [facility] to [facility] now.  She does have a ER she could walk around in but she is not doing that either.  Aside from that a detailed review of systems today was stable   COVID 19 VACCINATION STATUS: vaccinated with [place] x2 and has received the booster     BREAST CANCER HISTORY: From the original intake note:  The patient had routine screening mammography at the [facility] 01/20/2015 showing a possible mass in the right breast. On 02/19/2015 she returned for right  diagnostic mammography with tomosynthesis and right breast ultrasonography. The breast density was category B. In the upper inner quadrant of the right breast there was a 1.3 cm irregular mass which was palpable by exam. Ultrasonography measured this at 1.3 cm maximally. Ultrasound of the right axilla was unremarkable.  Biopsy of this mass 02/27/2015 showed ([id number]) an invasive ductal carcinoma, grade 1 or 2, 100% estrogen receptor positive, with strong staining intensity; 90% progesterone receptor positive with moderate staining intensity, with an MIB-1 of 10%, and no HER-2 implication, the signals ratio 1.15 and the number per cell 1.95.  The case was discussed at breast cancer conference and it was decided sentinel lymph node sampling and Oncotype would not be needed. The patient proceeded to right lumpectomy 04/07/2015, with the final pathology ([id number]) showing an invasive ductal carcinoma, grade 1, measuring 1.3 cm. Margins were negative. Repeat HER-2 was again negative, with a signals ratio of 1.43, the number per cell being 2.00.  The patient met with radiation oncology 05/16/2015. At that time the possibility of adjuvant radiation versus anti-estrogens only was discussed. The patient was supposed to have seen me after that visit but she did not show. She subsequently called and said that she would like "pill" rather than radiation and was started on anastrozole April 2017.  Her subsequent history is as detailed below   PAST MEDICAL HISTORY: Past Medical History:  Diagnosis Date  . Anticoagulated   . Anxiety   . Arthritis of hand   . Atrial flutter ([place])    with LVEF 60%, LVH, LAE and holter with rate control  and Ave HR 60 bpm   . Breast cancer ([place]) 2017    RIGHT BREAST CA  . Carotid artery plaque    mild bilateral carotid plaque without significant stenosis (<20% in left ICA), 04/2010  . CKD (chronic kidney disease) stage 2, GFR 60-89 ml/min   . Diverticula, colon   .  Family history of breast cancer   . Family history of prostate cancer   . Hypercholesterolemia   . Hypertension    with LVH on echo 06/03/11  . Hypertonicity of bladder   . Insomnia   . Mitral regurgitation   . Osteopenia    > osteoporosis  . Overactive bladder     PAST SURGICAL HISTORY: Past Surgical History:  Procedure Laterality Date  . BREAST BIOPSY    . BREAST LUMPECTOMY Right    2017  . BREAST LUMPECTOMY WITH RADIOACTIVE SEED LOCALIZATION Right 04/07/2015   Procedure: RIGHT BREAST LUMPECTOMY WITH RADIOACTIVE SEED LOCALIZATION;  Surgeon: Excell Seltzer, MD;  Location: [facility];  Service: General;  Laterality: Right;  . COLONOSCOPY  2001   2007 sigmoidoscopy     FAMILY HISTORY: Family History  Problem Relation Age of Onset  . Diabetes Mother   . Liver cancer Mother   . Breast cancer Sister 89  . Prostate cancer Brother 68       metastatic  . Heart attack Neg Hx   . Stroke Neg Hx   Patient's father died in his 101s from causes unknown to the patient. The patient's mother died at the age of 34 with what may have been breast cancer. The patient had 2 brothers one sister. One sister was diagnosed with breast cancer in her early 47s.   GYNECOLOGIC HISTORY:  No LMP recorded. Patient is postmenopausal. Menarche age 68, first live birth age 19, the patient is Martha Edwards. She does not recall when she went through menopause. She never took hormone replacement or oral contraceptives   SOCIAL HISTORY:  The patient worked at [employer] for many years. She is now retired. She is widowed, lives by herself, with no pets. Her daughter [name] is an Optometrist for the [employer] system. Son [name] lives in [place] and is disabled. Son [name] lives in [place] and works for [employer]. The patient has 4 grandchildren and 2 great-grandchildren.   ADVANCED DIRECTIVES: The patient tells me her daughter Olin Hauser is her healthcare part of attorney. She can  be reached at [place]: Social History   Tobacco Use  . Smoking status: Former Smoker    Quit date: 02/02/1984    Years since quitting: 36.1  . Smokeless tobacco: Never Used    Colonoscopy:  PAP:  Bone density:   No Known Allergies  Current Outpatient Medications  Medication Sig Dispense Refill  . acetaminophen (TYLENOL) 325 MG tablet Take 650 mg by mouth every 6 (six) hours as needed for mild pain.    Marland Kitchen amLODipine (NORVASC) 2.5 MG tablet Take 1 tablet (2.5 mg total) by mouth daily. 90 tablet 3  . anastrozole (ARIMIDEX) 1 MG tablet TAKE 1 TABLET BY MOUTH EVERY DAY 90 tablet 4  . atorvastatin (LIPITOR) 40 MG tablet Take 40 mg by mouth daily.    Marland Kitchen CALCIUM PO Take 1 tablet by mouth daily.    . Cholecalciferol (VITAMIN D3 PO) Take 1 tablet by mouth daily.    . Denosumab (PROLIA [place]) Take as directed once every 6 months.    . metoprolol  succinate (TOPROL-XL) 25 MG 24 hr tablet Take 12.5 mg by mouth daily.    . mirtazapine (REMERON) 15 MG tablet Take 15 mg by mouth at bedtime.    Marland Kitchen warfarin (COUMADIN) 4 MG tablet TAKE AS DIRECTED BY COUMADIN CLINIC **90 DAY SUPPLY 150 tablet 0   No current facility-administered medications for this visit.    OBJECTIVE: African-American woman who appears stated age 31:   04/02/20 1016  BP: (!) 182/63  Pulse: (!) 50  Resp: 18  Temp: (!) 97.3 F (36.3 C)  SpO2: 95%     Body mass index is 23.17 kg/m.    ECOG FS:1 - Symptomatic but completely ambulatory  Sclerae unicteric, EOMs intact Wearing a mask No cervical or supraclavicular adenopathy Lungs no rales or rhonchi Heart regular rate and rhythm Abd soft, nontender, positive bowel sounds MSK no focal spinal tenderness, no upper extremity lymphedema Neuro: nonfocal, well oriented, appropriate affect Breasts: The right breast is status post lumpectomy (no radiation).  The left breast and both axillae are benign   LAB RESULTS:  CMP     Component Value Date/Time    NA 144 10/05/2019 0947   NA 142 10/21/2016 1334   K 4.0 10/05/2019 0947   K 4.2 10/21/2016 1334   CL 107 10/05/2019 0947   CO2 28 10/05/2019 0947   CO2 25 10/21/2016 1334   GLUCOSE 90 10/05/2019 0947   GLUCOSE 85 10/21/2016 1334   BUN 14 10/05/2019 0947   BUN 11.5 10/21/2016 1334   CREATININE 1.10 (H) 10/05/2019 0947   CREATININE 0.9 10/21/2016 1334   CALCIUM 9.9 10/05/2019 0947   CALCIUM 9.9 10/21/2016 1334   PROT 7.0 10/05/2019 0947   PROT 7.2 10/21/2016 1334   ALBUMIN 4.0 10/05/2019 0947   ALBUMIN 4.1 10/21/2016 1334   AST 18 10/05/2019 0947   AST 19 10/21/2016 1334   ALT 9 10/05/2019 0947   ALT 10 10/21/2016 1334   ALKPHOS 50 10/05/2019 0947   ALKPHOS 49 10/21/2016 1334   BILITOT 0.5 10/05/2019 0947   BILITOT 0.76 10/21/2016 1334   GFRNONAA 46 (L) 10/05/2019 0947   GFRAA 53 (L) 10/05/2019 0947    INo results found for: SPEP, UPEP  Lab Results  Component Value Date   WBC 7.2 04/02/2020   NEUTROABS 4.3 04/02/2020   HGB 12.9 04/02/2020   HCT 40.7 04/02/2020   MCV 88.9 04/02/2020   PLT 206 04/02/2020      Chemistry      Component Value Date/Time   NA 144 10/05/2019 0947   NA 142 10/21/2016 1334   K 4.0 10/05/2019 0947   K 4.2 10/21/2016 1334   CL 107 10/05/2019 0947   CO2 28 10/05/2019 0947   CO2 25 10/21/2016 1334   BUN 14 10/05/2019 0947   BUN 11.5 10/21/2016 1334   CREATININE 1.10 (H) 10/05/2019 0947   CREATININE 0.9 10/21/2016 1334      Component Value Date/Time   CALCIUM 9.9 10/05/2019 0947   CALCIUM 9.9 10/21/2016 1334   ALKPHOS 50 10/05/2019 0947   ALKPHOS 49 10/21/2016 1334   AST 18 10/05/2019 0947   AST 19 10/21/2016 1334   ALT 9 10/05/2019 0947   ALT 10 10/21/2016 1334   BILITOT 0.5 10/05/2019 0947   BILITOT 0.76 10/21/2016 1334       No results found for: LABCA2  No components found for: LABCA125  No results for input(s): INR in the last 168 hours.  Urinalysis No results found for: COLORURINE, APPEARANCEUR,  LABSPEC,  PHURINE, GLUCOSEU, HGBUR, BILIRUBINUR, KETONESUR, PROTEINUR, UROBILINOGEN, NITRITE, LEUKOCYTESUR   STUDIES: No results found.    ELIGIBLE FOR AVAILABLE RESEARCH PROTOCOL: no   ASSESSMENT: 87 y.o. [place] woman status post right breast upper inner quadrant lumpectomy 04/07/2015 for a pT1c cN0, stage IA invasive ductal carcinoma, grade 1, estrogen and progesterone receptor positive, HER-2 nonamplified, with an MIB-1 of 10%.  (1) the patient opted against adjuvant radiation   (2) started anastrozole April 2017, completing 5 years March 2022   (3) severe osteopenia, denosumab/Prolia started 05/06/2016, last dose 04/02/2020  (a) bone density 01/20/2016 showed a T score of -2.4 (no change)  (b) bone density 02/2018 shows T score of -2.4   (c) bone density 04/08/2020   PLAN: Martha Edwards is now 5 years out from definitive surgery for her breast cancer with no evidence of disease recurrence.  This is very favorable.  She is completing 5 years of anastrozole.  She will take the pills that she has and then stop.  She has tolerated this remarkably well and I do not anticipate she will feel any different after going off this medication.  She will receive her last Prolia dose today.  She has a bone density scan scheduled for next week and I anticipate she will get good news  At this point I feel comfortable releasing her to her primary care physician.  All she will need in terms of breast cancer follow-up is her yearly mammography and her yearly physician breast exam.  Martha Edwards is very interested in participating in our survivorship program and I have enrolled her to see one of our advanced practice providers March 2023  I will be glad to see Martha Edwards at any point in the future if and when the need arises but as of now are making no further routine appointments with me here  Total encounter time 25 minutes.Martha Edwards Auguste, MD  04/02/20 10:34 AM Medical Oncology and Hematology [facility] [place], [place] [address] Tel. [phone]    Fax. [phone]   I, Wilburn Mylar, am acting as scribe for Dr. Virgie Dad. Liyanna Cartwright.  I, Lurline Del MD, have reviewed the above documentation for accuracy and completeness, and I agree with the above.   *Total Encounter Time as defined by the Centers for Medicare and Medicaid Services includes, in addition to the face-to-face time of a patient visit (documented in the note above) non-face-to-face time: obtaining and reviewing outside history, ordering and reviewing medications, tests or procedures, care coordination (communications with other health care professionals or caregivers) and documentation in the medical record.

## 2020-04-02 ENCOUNTER — Inpatient Hospital Stay: Payer: Medicare Other

## 2020-04-02 ENCOUNTER — Inpatient Hospital Stay (HOSPITAL_BASED_OUTPATIENT_CLINIC_OR_DEPARTMENT_OTHER): Payer: Medicare Other | Admitting: Oncology

## 2020-04-02 ENCOUNTER — Inpatient Hospital Stay: Payer: Medicare Other | Attending: Oncology

## 2020-04-02 ENCOUNTER — Other Ambulatory Visit: Payer: Self-pay

## 2020-04-02 VITALS — BP 182/63 | HR 50 | Temp 97.3°F | Resp 18 | Ht 68.0 in | Wt 152.4 lb

## 2020-04-02 DIAGNOSIS — Z87891 Personal history of nicotine dependence: Secondary | ICD-10-CM | POA: Insufficient documentation

## 2020-04-02 DIAGNOSIS — M129 Arthropathy, unspecified: Secondary | ICD-10-CM | POA: Diagnosis not present

## 2020-04-02 DIAGNOSIS — I129 Hypertensive chronic kidney disease with stage 1 through stage 4 chronic kidney disease, or unspecified chronic kidney disease: Secondary | ICD-10-CM | POA: Insufficient documentation

## 2020-04-02 DIAGNOSIS — Z800 Family history of malignant neoplasm of digestive organs: Secondary | ICD-10-CM | POA: Insufficient documentation

## 2020-04-02 DIAGNOSIS — M8580 Other specified disorders of bone density and structure, unspecified site: Secondary | ICD-10-CM

## 2020-04-02 DIAGNOSIS — Z803 Family history of malignant neoplasm of breast: Secondary | ICD-10-CM | POA: Diagnosis not present

## 2020-04-02 DIAGNOSIS — E7800 Pure hypercholesterolemia, unspecified: Secondary | ICD-10-CM | POA: Insufficient documentation

## 2020-04-02 DIAGNOSIS — C50211 Malignant neoplasm of upper-inner quadrant of right female breast: Secondary | ICD-10-CM

## 2020-04-02 DIAGNOSIS — Z79899 Other long term (current) drug therapy: Secondary | ICD-10-CM | POA: Diagnosis not present

## 2020-04-02 DIAGNOSIS — Z7901 Long term (current) use of anticoagulants: Secondary | ICD-10-CM | POA: Insufficient documentation

## 2020-04-02 DIAGNOSIS — Z79811 Long term (current) use of aromatase inhibitors: Secondary | ICD-10-CM | POA: Insufficient documentation

## 2020-04-02 DIAGNOSIS — Z170 Estrogen receptor positive status [ER+]: Secondary | ICD-10-CM | POA: Diagnosis not present

## 2020-04-02 DIAGNOSIS — Z8042 Family history of malignant neoplasm of prostate: Secondary | ICD-10-CM | POA: Insufficient documentation

## 2020-04-02 DIAGNOSIS — F419 Anxiety disorder, unspecified: Secondary | ICD-10-CM | POA: Insufficient documentation

## 2020-04-02 DIAGNOSIS — Z923 Personal history of irradiation: Secondary | ICD-10-CM | POA: Diagnosis not present

## 2020-04-02 DIAGNOSIS — G4700 Insomnia, unspecified: Secondary | ICD-10-CM | POA: Insufficient documentation

## 2020-04-02 LAB — CBC WITH DIFFERENTIAL (CANCER CENTER ONLY)
Abs Immature Granulocytes: 0.01 10*3/uL (ref 0.00–0.07)
Basophils Absolute: 0 10*3/uL (ref 0.0–0.1)
Basophils Relative: 0 %
Eosinophils Absolute: 0.2 10*3/uL (ref 0.0–0.5)
Eosinophils Relative: 3 %
HCT: 40.7 % (ref 36.0–46.0)
Hemoglobin: 12.9 g/dL (ref 12.0–15.0)
Immature Granulocytes: 0 %
Lymphocytes Relative: 29 %
Lymphs Abs: 2.1 10*3/uL (ref 0.7–4.0)
MCH: 28.2 pg (ref 26.0–34.0)
MCHC: 31.7 g/dL (ref 30.0–36.0)
MCV: 88.9 fL (ref 80.0–100.0)
Monocytes Absolute: 0.6 10*3/uL (ref 0.1–1.0)
Monocytes Relative: 8 %
Neutro Abs: 4.3 10*3/uL (ref 1.7–7.7)
Neutrophils Relative %: 60 %
Platelet Count: 206 10*3/uL (ref 150–400)
RBC: 4.58 MIL/uL (ref 3.87–5.11)
RDW: 14.8 % (ref 11.5–15.5)
WBC Count: 7.2 10*3/uL (ref 4.0–10.5)
nRBC: 0 % (ref 0.0–0.2)

## 2020-04-02 LAB — CMP (CANCER CENTER ONLY)
ALT: 15 U/L (ref 0–44)
AST: 19 U/L (ref 15–41)
Albumin: 4.2 g/dL (ref 3.5–5.0)
Alkaline Phosphatase: 42 U/L (ref 38–126)
Anion gap: 9 (ref 5–15)
BUN: 15 mg/dL (ref 8–23)
CO2: 25 mmol/L (ref 22–32)
Calcium: 9.9 mg/dL (ref 8.9–10.3)
Chloride: 109 mmol/L (ref 98–111)
Creatinine: 1.18 mg/dL — ABNORMAL HIGH (ref 0.44–1.00)
GFR, Estimated: 45 mL/min — ABNORMAL LOW (ref >60)
Glucose, Bld: 87 mg/dL (ref 70–99)
Potassium: 3.9 mmol/L (ref 3.5–5.1)
Sodium: 143 mmol/L (ref 135–145)
Total Bilirubin: 0.5 mg/dL (ref 0.3–1.2)
Total Protein: 7.2 g/dL (ref 6.5–8.1)

## 2020-04-02 MED ORDER — DENOSUMAB 60 MG/ML ~~LOC~~ SOSY
PREFILLED_SYRINGE | SUBCUTANEOUS | Status: AC
  Filled 2020-04-02: qty 1

## 2020-04-02 MED ORDER — DENOSUMAB 60 MG/ML ~~LOC~~ SOSY
60.0000 mg | PREFILLED_SYRINGE | Freq: Once | SUBCUTANEOUS | Status: AC
  Administered 2020-04-02: 60 mg via SUBCUTANEOUS
  Filled 2020-04-02: qty 1

## 2020-04-02 NOTE — Patient Instructions (Signed)
Denosumab injection What is this medicine? DENOSUMAB (den oh sue mab) slows bone breakdown. Prolia is used to treat osteoporosis in women after menopause and in men, and in people who are taking corticosteroids for 6 months or more. Xgeva is used to treat a high calcium level due to cancer and to prevent bone fractures and other bone problems caused by multiple myeloma or cancer bone metastases. Xgeva is also used to treat giant cell tumor of the bone. This medicine may be used for other purposes; ask your health care provider or pharmacist if you have questions. COMMON BRAND NAME(S): Prolia, XGEVA What should I tell my health care provider before I take this medicine? They need to know if you have any of these conditions:  dental disease  having surgery or tooth extraction  infection  kidney disease  low levels of calcium or Vitamin D in the blood  malnutrition  on hemodialysis  skin conditions or sensitivity  thyroid or parathyroid disease  an unusual reaction to denosumab, other medicines, foods, dyes, or preservatives  pregnant or trying to get pregnant  breast-feeding How should I use this medicine? This medicine is for injection under the skin. It is given by a health care professional in a hospital or clinic setting. A special MedGuide will be given to you before each treatment. Be sure to read this information carefully each time. For Prolia, talk to your pediatrician regarding the use of this medicine in children. Special care may be needed. For Xgeva, talk to your pediatrician regarding the use of this medicine in children. While this drug may be prescribed for children as young as 13 years for selected conditions, precautions do apply. Overdosage: If you think you have taken too much of this medicine contact a poison control center or emergency room at once. NOTE: This medicine is only for you. Do not share this medicine with others. What if I miss a dose? It is  important not to miss your dose. Call your doctor or health care professional if you are unable to keep an appointment. What may interact with this medicine? Do not take this medicine with any of the following medications:  other medicines containing denosumab This medicine may also interact with the following medications:  medicines that lower your chance of fighting infection  steroid medicines like prednisone or cortisone This list may not describe all possible interactions. Give your health care provider a list of all the medicines, herbs, non-prescription drugs, or dietary supplements you use. Also tell them if you smoke, drink alcohol, or use illegal drugs. Some items may interact with your medicine. What should I watch for while using this medicine? Visit your doctor or health care professional for regular checks on your progress. Your doctor or health care professional may order blood tests and other tests to see how you are doing. Call your doctor or health care professional for advice if you get a fever, chills or sore throat, or other symptoms of a cold or flu. Do not treat yourself. This drug may decrease your body's ability to fight infection. Try to avoid being around people who are sick. You should make sure you get enough calcium and vitamin D while you are taking this medicine, unless your doctor tells you not to. Discuss the foods you eat and the vitamins you take with your health care professional. See your dentist regularly. Brush and floss your teeth as directed. Before you have any dental work done, tell your dentist you are   receiving this medicine. Do not become pregnant while taking this medicine or for 5 months after stopping it. Talk with your doctor or health care professional about your birth control options while taking this medicine. Women should inform their doctor if they wish to become pregnant or think they might be pregnant. There is a potential for serious side  effects to an unborn child. Talk to your health care professional or pharmacist for more information. What side effects may I notice from receiving this medicine? Side effects that you should report to your doctor or health care professional as soon as possible:  allergic reactions like skin rash, itching or hives, swelling of the face, lips, or tongue  bone pain  breathing problems  dizziness  jaw pain, especially after dental work  redness, blistering, peeling of the skin  signs and symptoms of infection like fever or chills; cough; sore throat; pain or trouble passing urine  signs of low calcium like fast heartbeat, muscle cramps or muscle pain; pain, tingling, numbness in the hands or feet; seizures  unusual bleeding or bruising  unusually weak or tired Side effects that usually do not require medical attention (report to your doctor or health care professional if they continue or are bothersome):  constipation  diarrhea  headache  joint pain  loss of appetite  muscle pain  runny nose  tiredness  upset stomach This list may not describe all possible side effects. Call your doctor for medical advice about side effects. You may report side effects to FDA at 1-800-FDA-1088. Where should I keep my medicine? This medicine is only given in a clinic, doctor's office, or other health care setting and will not be stored at home. NOTE: This sheet is a summary. It may not cover all possible information. If you have questions about this medicine, talk to your doctor, pharmacist, or health care provider.  2021 Elsevier/Gold Standard (2017-05-27 16:10:44)

## 2020-04-03 ENCOUNTER — Ambulatory Visit: Payer: Medicare Other

## 2020-04-03 ENCOUNTER — Ambulatory Visit: Payer: Medicare Other | Admitting: Hematology and Oncology

## 2020-04-03 ENCOUNTER — Other Ambulatory Visit: Payer: Medicare Other

## 2020-04-08 ENCOUNTER — Other Ambulatory Visit: Payer: Medicare Other

## 2020-05-01 ENCOUNTER — Other Ambulatory Visit: Payer: Self-pay

## 2020-05-01 ENCOUNTER — Ambulatory Visit (INDEPENDENT_AMBULATORY_CARE_PROVIDER_SITE_OTHER): Payer: Medicare Other | Admitting: *Deleted

## 2020-05-01 DIAGNOSIS — Z5181 Encounter for therapeutic drug level monitoring: Secondary | ICD-10-CM | POA: Diagnosis not present

## 2020-05-01 LAB — POCT INR: INR: 2.4 (ref 2.0–3.0)

## 2020-05-01 NOTE — Patient Instructions (Signed)
Description   Continue taking Warfarin 1.5 tablets (6mg ) daily. Recheck INR in 8 weeks. [facility] [phone]

## 2020-06-24 ENCOUNTER — Other Ambulatory Visit: Payer: Self-pay | Admitting: Interventional Cardiology

## 2020-06-24 NOTE — Telephone Encounter (Signed)
Prescription refill request received for warfarin Lov: Tamala Julian, 02/07/2020 Next INR check: 5/26 Warfarin tablet strength: 4mg 

## 2020-06-26 ENCOUNTER — Encounter (INDEPENDENT_AMBULATORY_CARE_PROVIDER_SITE_OTHER): Payer: Self-pay

## 2020-06-26 ENCOUNTER — Other Ambulatory Visit: Payer: Self-pay

## 2020-06-26 ENCOUNTER — Ambulatory Visit (INDEPENDENT_AMBULATORY_CARE_PROVIDER_SITE_OTHER): Payer: Medicare Other

## 2020-06-26 DIAGNOSIS — Z5181 Encounter for therapeutic drug level monitoring: Secondary | ICD-10-CM | POA: Diagnosis not present

## 2020-06-26 LAB — POCT INR: INR: 2.9 (ref 2.0–3.0)

## 2020-06-26 NOTE — Patient Instructions (Signed)
Continue taking Warfarin 1.5 tablets (6mg ) daily. Recheck INR in 8 weeks. [facility] [phone]

## 2020-08-06 ENCOUNTER — Other Ambulatory Visit: Payer: Self-pay | Admitting: Oncology

## 2020-08-21 ENCOUNTER — Ambulatory Visit (INDEPENDENT_AMBULATORY_CARE_PROVIDER_SITE_OTHER): Payer: Medicare Other

## 2020-08-21 ENCOUNTER — Other Ambulatory Visit: Payer: Self-pay

## 2020-08-21 DIAGNOSIS — I4892 Unspecified atrial flutter: Secondary | ICD-10-CM

## 2020-08-21 DIAGNOSIS — Z5181 Encounter for therapeutic drug level monitoring: Secondary | ICD-10-CM | POA: Diagnosis not present

## 2020-08-21 LAB — POCT INR: INR: 2.7 (ref 2.0–3.0)

## 2020-08-21 NOTE — Patient Instructions (Signed)
Description   Continue taking Warfarin 1.5 tablets (6mg ) daily. Recheck INR in 8 weeks. [facility] [phone]

## 2020-09-10 DIAGNOSIS — E785 Hyperlipidemia, unspecified: Secondary | ICD-10-CM | POA: Diagnosis not present

## 2020-09-10 DIAGNOSIS — Z0000 Encounter for general adult medical examination without abnormal findings: Secondary | ICD-10-CM | POA: Diagnosis not present

## 2020-09-10 DIAGNOSIS — F322 Major depressive disorder, single episode, severe without psychotic features: Secondary | ICD-10-CM | POA: Diagnosis not present

## 2020-09-10 DIAGNOSIS — C50919 Malignant neoplasm of unspecified site of unspecified female breast: Secondary | ICD-10-CM | POA: Diagnosis not present

## 2020-09-10 DIAGNOSIS — M810 Age-related osteoporosis without current pathological fracture: Secondary | ICD-10-CM | POA: Diagnosis not present

## 2020-09-10 DIAGNOSIS — I495 Sick sinus syndrome: Secondary | ICD-10-CM | POA: Diagnosis not present

## 2020-09-10 DIAGNOSIS — Z23 Encounter for immunization: Secondary | ICD-10-CM | POA: Diagnosis not present

## 2020-09-10 DIAGNOSIS — I10 Essential (primary) hypertension: Secondary | ICD-10-CM | POA: Diagnosis not present

## 2020-09-10 DIAGNOSIS — G479 Sleep disorder, unspecified: Secondary | ICD-10-CM | POA: Diagnosis not present

## 2020-09-10 DIAGNOSIS — I4891 Unspecified atrial fibrillation: Secondary | ICD-10-CM | POA: Diagnosis not present

## 2020-09-10 DIAGNOSIS — I272 Pulmonary hypertension, unspecified: Secondary | ICD-10-CM | POA: Diagnosis not present

## 2020-09-10 DIAGNOSIS — Z79899 Other long term (current) drug therapy: Secondary | ICD-10-CM | POA: Diagnosis not present

## 2020-09-12 ENCOUNTER — Other Ambulatory Visit: Payer: Self-pay

## 2020-09-12 ENCOUNTER — Ambulatory Visit
Admission: RE | Admit: 2020-09-12 | Discharge: 2020-09-12 | Disposition: A | Discharge status: Home / Self Care | Payer: Medicare Other | Source: Ambulatory Visit | Attending: Oncology | Admitting: Oncology

## 2020-09-12 DIAGNOSIS — I495 Sick sinus syndrome: Secondary | ICD-10-CM

## 2020-09-12 DIAGNOSIS — M8580 Other specified disorders of bone density and structure, unspecified site: Secondary | ICD-10-CM

## 2020-09-12 DIAGNOSIS — C50211 Malignant neoplasm of upper-inner quadrant of right female breast: Secondary | ICD-10-CM

## 2020-09-12 DIAGNOSIS — Z170 Estrogen receptor positive status [ER+]: Secondary | ICD-10-CM

## 2020-09-12 DIAGNOSIS — Z780 Asymptomatic menopausal state: Secondary | ICD-10-CM | POA: Diagnosis not present

## 2020-09-12 DIAGNOSIS — M8589 Other specified disorders of bone density and structure, multiple sites: Secondary | ICD-10-CM | POA: Diagnosis not present

## 2020-09-12 DIAGNOSIS — Z7901 Long term (current) use of anticoagulants: Secondary | ICD-10-CM

## 2020-09-12 DIAGNOSIS — I4892 Unspecified atrial flutter: Secondary | ICD-10-CM

## 2020-09-19 ENCOUNTER — Other Ambulatory Visit: Payer: Self-pay | Admitting: Interventional Cardiology

## 2020-10-16 ENCOUNTER — Other Ambulatory Visit: Payer: Self-pay

## 2020-10-16 ENCOUNTER — Ambulatory Visit (INDEPENDENT_AMBULATORY_CARE_PROVIDER_SITE_OTHER): Payer: Medicare Other | Admitting: *Deleted

## 2020-10-16 DIAGNOSIS — Z5181 Encounter for therapeutic drug level monitoring: Secondary | ICD-10-CM

## 2020-10-16 LAB — POCT INR: INR: 2.2 (ref 2.0–3.0)

## 2020-10-16 NOTE — Patient Instructions (Signed)
Description   Continue taking Warfarin 1.5 tablets ('6mg'$ ) daily. Recheck INR in 8 weeks. [facility] [phone]

## 2020-11-13 DIAGNOSIS — Z23 Encounter for immunization: Secondary | ICD-10-CM | POA: Diagnosis not present

## 2020-11-20 DIAGNOSIS — H5202 Hypermetropia, left eye: Secondary | ICD-10-CM | POA: Diagnosis not present

## 2020-11-20 DIAGNOSIS — Z961 Presence of intraocular lens: Secondary | ICD-10-CM | POA: Diagnosis not present

## 2020-11-20 DIAGNOSIS — H35373 Puckering of macula, bilateral: Secondary | ICD-10-CM | POA: Diagnosis not present

## 2020-11-20 DIAGNOSIS — H349 Unspecified retinal vascular occlusion: Secondary | ICD-10-CM | POA: Diagnosis not present

## 2020-12-11 ENCOUNTER — Ambulatory Visit (INDEPENDENT_AMBULATORY_CARE_PROVIDER_SITE_OTHER): Payer: Medicare Other

## 2020-12-11 ENCOUNTER — Other Ambulatory Visit: Payer: Self-pay

## 2020-12-11 DIAGNOSIS — Z5181 Encounter for therapeutic drug level monitoring: Secondary | ICD-10-CM

## 2020-12-11 LAB — POCT INR: INR: 3.1 — AB (ref 2.0–3.0)

## 2020-12-11 NOTE — Patient Instructions (Signed)
Description   Continue taking Warfarin 1.5 tablets (6mg ) daily. Recheck INR in 8 weeks. [facility] [phone]

## 2020-12-22 ENCOUNTER — Other Ambulatory Visit: Payer: Self-pay | Admitting: Interventional Cardiology

## 2021-01-15 ENCOUNTER — Other Ambulatory Visit: Payer: Self-pay | Admitting: Oncology

## 2021-01-15 DIAGNOSIS — Z1231 Encounter for screening mammogram for malignant neoplasm of breast: Secondary | ICD-10-CM

## 2021-01-16 DIAGNOSIS — I10 Essential (primary) hypertension: Secondary | ICD-10-CM | POA: Diagnosis not present

## 2021-01-16 DIAGNOSIS — E785 Hyperlipidemia, unspecified: Secondary | ICD-10-CM | POA: Diagnosis not present

## 2021-01-16 DIAGNOSIS — G4700 Insomnia, unspecified: Secondary | ICD-10-CM | POA: Diagnosis not present

## 2021-01-16 DIAGNOSIS — I272 Pulmonary hypertension, unspecified: Secondary | ICD-10-CM | POA: Diagnosis not present

## 2021-01-16 DIAGNOSIS — M810 Age-related osteoporosis without current pathological fracture: Secondary | ICD-10-CM | POA: Diagnosis not present

## 2021-01-16 DIAGNOSIS — I4891 Unspecified atrial fibrillation: Secondary | ICD-10-CM | POA: Diagnosis not present

## 2021-02-05 ENCOUNTER — Ambulatory Visit (INDEPENDENT_AMBULATORY_CARE_PROVIDER_SITE_OTHER): Payer: Medicare Other | Admitting: *Deleted

## 2021-02-05 ENCOUNTER — Other Ambulatory Visit: Payer: Self-pay

## 2021-02-05 DIAGNOSIS — Z5181 Encounter for therapeutic drug level monitoring: Secondary | ICD-10-CM | POA: Diagnosis not present

## 2021-02-05 LAB — POCT INR: INR: 2.4 (ref 2.0–3.0)

## 2021-02-05 NOTE — Patient Instructions (Signed)
Description   Continue taking Warfarin 1.5 tablets (6mg ) daily. Recheck INR in 8 weeks. [facility] [phone]

## 2021-02-17 ENCOUNTER — Ambulatory Visit
Admission: RE | Admit: 2021-02-17 | Discharge: 2021-02-17 | Disposition: A | Discharge status: Home / Self Care | Payer: Medicare Other | Source: Ambulatory Visit | Attending: Oncology | Admitting: Oncology

## 2021-02-17 DIAGNOSIS — Z1231 Encounter for screening mammogram for malignant neoplasm of breast: Secondary | ICD-10-CM

## 2021-02-26 ENCOUNTER — Other Ambulatory Visit: Payer: Self-pay | Admitting: Interventional Cardiology

## 2021-03-02 DIAGNOSIS — I272 Pulmonary hypertension, unspecified: Secondary | ICD-10-CM | POA: Diagnosis not present

## 2021-03-02 DIAGNOSIS — G4700 Insomnia, unspecified: Secondary | ICD-10-CM | POA: Diagnosis not present

## 2021-03-02 DIAGNOSIS — E785 Hyperlipidemia, unspecified: Secondary | ICD-10-CM | POA: Diagnosis not present

## 2021-03-02 DIAGNOSIS — I10 Essential (primary) hypertension: Secondary | ICD-10-CM | POA: Diagnosis not present

## 2021-03-02 DIAGNOSIS — M810 Age-related osteoporosis without current pathological fracture: Secondary | ICD-10-CM | POA: Diagnosis not present

## 2021-03-10 DIAGNOSIS — G479 Sleep disorder, unspecified: Secondary | ICD-10-CM | POA: Diagnosis not present

## 2021-03-10 DIAGNOSIS — I4891 Unspecified atrial fibrillation: Secondary | ICD-10-CM | POA: Diagnosis not present

## 2021-03-15 ENCOUNTER — Other Ambulatory Visit: Payer: Self-pay | Admitting: Interventional Cardiology

## 2021-03-16 NOTE — Telephone Encounter (Signed)
Prescription refill request received for warfarin Lov: 02/07/20 Tamala Julian) - Pt has scheduled appt with Dr Tamala Julian on 03/27/21  Next INR check: 04/02/21 Warfarin tablet strength: 4mg   Appropriate dose and refill sent to requested pharmacy.

## 2021-03-26 NOTE — Progress Notes (Signed)
Cardiology Office Note:    Date:  03/27/2021   ID:  Martha Edwards, DOB 24-Jan-1933, MRN [id number]  PCP:  [name], MD  Cardiologist:  Sinclair Grooms, MD   Referring MD: [name], MD   Chief Complaint  Patient presents with   Atrial Fibrillation   Hypertension    History of Present Illness:    Martha Edwards is a 88 y.o. female with a hx of  tachybradycardia syndrome, PAF, hypertension, systolic right upper sternal border murmur, and chronic anticoagulation therapy.   She is doing okay.  Does not have the energy to do which she once did.  Feels COVID-19 took away some of her social capital.  She denies angina.  No bleeding on Coumadin.  Past Medical History:  Diagnosis Date   Anticoagulated    Anxiety    Arthritis of hand    Atrial flutter ([place])    with LVEF 60%, LVH, LAE and holter with rate control and Ave HR 60 bpm    Breast cancer ([place]) 2017    RIGHT BREAST CA   Carotid artery plaque    mild bilateral carotid plaque without significant stenosis (<20% in left ICA), 04/2010   CKD (chronic kidney disease) stage 2, GFR 60-89 ml/min    Diverticula, colon    Family history of breast cancer    Family history of prostate cancer    Hypercholesterolemia    Hypertension    with LVH on echo 06/03/11   Hypertonicity of bladder    Insomnia    Mitral regurgitation    Osteopenia    > osteoporosis   Overactive bladder     Past Surgical History:  Procedure Laterality Date   BREAST BIOPSY     BREAST LUMPECTOMY Right    2017   BREAST LUMPECTOMY WITH RADIOACTIVE SEED LOCALIZATION Right 04/07/2015   Procedure: RIGHT BREAST LUMPECTOMY WITH RADIOACTIVE SEED LOCALIZATION;  Surgeon: Excell Seltzer, MD;  Location: [facility];  Service: General;  Laterality: Right;   COLONOSCOPY  2001   2007 sigmoidoscopy     Current Medications: Current Meds  Medication Sig   acetaminophen (TYLENOL) 325 MG tablet Take 650 mg by mouth every 6 (six) hours as  needed for mild pain.   amLODipine (NORVASC) 5 MG tablet Take 1 tablet (5 mg total) by mouth daily.   atorvastatin (LIPITOR) 40 MG tablet Take 40 mg by mouth daily.   CALCIUM PO Take 1 tablet by mouth daily.   Cholecalciferol (VITAMIN D3 PO) Take 1 tablet by mouth daily.   Denosumab (PROLIA Posey) Take as directed once every 6 months.   metoprolol succinate (TOPROL-XL) 25 MG 24 hr tablet Take 12.5 mg by mouth daily.   mirtazapine (REMERON) 30 MG tablet Take 60 mg by mouth at bedtime.   warfarin (COUMADIN) 4 MG tablet TAKE AS DIRECTED BY COUMADIN CLINIC **90 DAY SUPPLY   [DISCONTINUED] amLODipine (NORVASC) 2.5 MG tablet TAKE 1 TABLET BY MOUTH EVERY DAY   [DISCONTINUED] mirtazapine (REMERON) 15 MG tablet Take 15 mg by mouth at bedtime.     Allergies:   Patient has no known allergies.   Social History   Socioeconomic History   Marital status: Single    Spouse name: Not on file   Number of children: Not on file   Years of education: Not on file   Highest education level: Not on file  Occupational History   Not on file  Tobacco Use   Smoking status: Former  Types: Cigarettes    Quit date: 02/02/1984    Years since quitting: 37.1   Smokeless tobacco: Never  Substance and Sexual Activity   Alcohol use: Not on file   Drug use: Not on file   Sexual activity: Never  Other Topics Concern   Not on file  Social History Narrative   Not on file   Social Determinants of Health   Financial Resource Strain: Not on file  Food Insecurity: Not on file  Transportation Needs: Not on file  Physical Activity: Not on file  Stress: Not on file  Social Connections: Not on file     Family History: The patient's family history includes Breast cancer (age of onset: 22) in her sister; Diabetes in her mother; Liver cancer in her mother; Prostate cancer (age of onset: 17) in her brother. There is no history of Heart attack or Stroke.  ROS:   Please see the history of present illness.    Appetite is  good.  No orthopnea, PND, or angina.  No episodes of tachycardia.  All other systems reviewed and are negative.  EKGs/Labs/Other Studies Reviewed:    The following studies were reviewed today: 2D Doppler echocardiogram January 2022: IMPRESSIONS     1. Left ventricular ejection fraction, by estimation, is 65 to 70%. The  left ventricle has normal function. The left ventricle has no regional  wall motion abnormalities. Left ventricular diastolic parameters are  consistent with Grade I diastolic  dysfunction (impaired relaxation). Elevated left atrial pressure.   2. Right ventricular systolic function is normal. The right ventricular  size is normal. There is moderately elevated pulmonary artery systolic  pressure.   3. Left atrial size was mildly dilated.   4. Anterior MV leaflet is thickened, calcified with some restricted  motion. Peak nad mean gradients through the valve are 9 and 3 mm HG  respectively. MVA by P1/2 time is 2.24 cm2   5. AV is thickened, calcified with mildly restricted motion. Peak and  mean gradients through the valve are 32 and 18 mm Hg respectively  consistent iwth mild aortic stenosis.. The aortic valve is tricuspid.  Aortic valve regurgitation is mild to moderate.   Mild aortic valve stenosis   6. The inferior vena cava is normal in size with greater than 50%  respiratory variability, suggesting right atrial pressure of 3 mmHg.   EKG:  EKG sinus bradycardia 51 bpm.  By atrial abnormality.  Recent Labs: 04/02/2020: ALT 15; BUN 15; Creatinine 1.18; Hemoglobin 12.9; Platelet Count 206; Potassium 3.9; Sodium 143  Recent Lipid Panel No results found for: CHOL, TRIG, HDL, CHOLHDL, VLDL, LDLCALC, LDLDIRECT  Physical Exam:    VS:  BP (!) 150/72    Pulse (!) 51    Ht 5\' 8"  (1.727 m)    Wt 146 lb 6.4 oz (66.4 kg)    SpO2 96%    BMI 22.26 kg/m     Wt Readings from Last 3 Encounters:  03/27/21 146 lb 6.4 oz (66.4 kg)  04/02/20 152 lb 6.4 oz (69.1 kg)  02/07/20 154  lb 9.6 oz (70.1 kg)     GEN: Feels healthy. No acute distress HEENT: Normal NECK: No JVD. LYMPHATICS: No lymphadenopathy CARDIAC: 2/6 systolic at apex without diastolic murmur. RRR no gallop, or edema. VASCULAR:  Normal Pulses. No bruits. RESPIRATORY:  Clear to auscultation without rales, wheezing or rhonchi  ABDOMEN: Soft, non-tender, non-distended, No pulsatile mass, MUSCULOSKELETAL: No deformity  SKIN: Warm and dry NEUROLOGIC:  Alert and  oriented x 3 PSYCHIATRIC:  Normal affect   ASSESSMENT:    1. Paroxysmal atrial fibrillation (HCC)   2. Chronic anticoagulation   3. Nonrheumatic aortic valve stenosis   4. Tachy-brady syndrome ([place])   5. Essential hypertension    PLAN:    In order of problems listed above:  No clinical recurrences.  Significant bradycardia on low-dose metoprolol.  We will discontinue metoprolol 12.5 mg. Continue Coumadin therapy Auscultation is not changed from a year ago.  See echo report from a year ago. As she ages heart rate is getting slower.  We will discontinue beta-blocker therapy to avoid syncope due to bradycardia. Increase amlodipine dose to 5 mg/day.  Target 140/80 mmHg   Medication Adjustments/Labs and Tests Ordered: Current medicines are reviewed at length with the patient today.  Concerns regarding medicines are outlined above.  Orders Placed This Encounter  Procedures   EKG 12-Lead   Meds ordered this encounter  Medications   amLODipine (NORVASC) 5 MG tablet    Sig: Take 1 tablet (5 mg total) by mouth daily.    Dispense:  90 tablet    Refill:  3    Dose change.  D/C Metoprolol    Patient Instructions  Medication Instructions:  1) DISCONTINUE Metoprolol 2) INCREASE Amlodipine to 5mg  once daily  *If you need a refill on your cardiac medications before your next appointment, please call your pharmacy*   Lab Work: None If you have labs (blood work) drawn today and your tests are completely normal, you will receive your  results only by: [place] (if you have MyChart) OR A paper copy in the mail If you have any lab test that is abnormal or we need to change your treatment, we will call you to review the results.   Testing/Procedures: None   Follow-Up: At [facility], you and your health needs are our priority.  As part of our continuing mission to provide you with exceptional heart care, we have created designated Provider Care Teams.  These Care Teams include your primary Cardiologist (physician) and Advanced Practice Providers (APPs -  Physician Assistants and Nurse Practitioners) who all work together to provide you with the care you need, when you need it.  We recommend signing up for the patient portal called "MyChart".  Sign up information is provided on this After Visit Summary.  MyChart is used to connect with patients for Virtual Visits (Telemedicine).  Patients are able to view lab/test results, encounter notes, upcoming appointments, etc.  Non-urgent messages can be sent to your provider as well.   To learn more about what you can do with MyChart, go to [email or web address].    Your next appointment:   6 months  The format for your next appointment:   In Person  Provider:   Sinclair Grooms, MD     Other Instructions     Signed, Sinclair Grooms, MD  03/27/2021 4:35 PM    [place]

## 2021-03-27 ENCOUNTER — Other Ambulatory Visit: Payer: Self-pay

## 2021-03-27 ENCOUNTER — Encounter: Payer: Self-pay | Admitting: Interventional Cardiology

## 2021-03-27 ENCOUNTER — Ambulatory Visit (INDEPENDENT_AMBULATORY_CARE_PROVIDER_SITE_OTHER): Payer: Medicare Other | Admitting: Interventional Cardiology

## 2021-03-27 VITALS — BP 150/72 | HR 51 | Ht 68.0 in | Wt 146.4 lb

## 2021-03-27 DIAGNOSIS — I350 Nonrheumatic aortic (valve) stenosis: Secondary | ICD-10-CM

## 2021-03-27 DIAGNOSIS — I480 Paroxysmal atrial fibrillation: Secondary | ICD-10-CM

## 2021-03-27 DIAGNOSIS — I495 Sick sinus syndrome: Secondary | ICD-10-CM | POA: Diagnosis not present

## 2021-03-27 DIAGNOSIS — Z7901 Long term (current) use of anticoagulants: Secondary | ICD-10-CM

## 2021-03-27 DIAGNOSIS — Z7189 Other specified counseling: Secondary | ICD-10-CM

## 2021-03-27 DIAGNOSIS — I10 Essential (primary) hypertension: Secondary | ICD-10-CM | POA: Diagnosis not present

## 2021-03-27 MED ORDER — AMLODIPINE BESYLATE 5 MG PO TABS: 5.0000 mg | ORAL_TABLET | Freq: Every day | ORAL | 3 refills | Status: DC

## 2021-03-27 NOTE — Patient Instructions (Addendum)
Medication Instructions:  1) DISCONTINUE Metoprolol 2) INCREASE Amlodipine to 5mg  once daily  *If you need a refill on your cardiac medications before your next appointment, please call your pharmacy*   Lab Work: None If you have labs (blood work) drawn today and your tests are completely normal, you will receive your results only by: [place] (if you have MyChart) OR A paper copy in the mail If you have any lab test that is abnormal or we need to change your treatment, we will call you to review the results.   Testing/Procedures: None   Follow-Up: At [facility], you and your health needs are our priority.  As part of our continuing mission to provide you with exceptional heart care, we have created designated Provider Care Teams.  These Care Teams include your primary Cardiologist (physician) and Advanced Practice Providers (APPs -  Physician Assistants and Nurse Practitioners) who all work together to provide you with the care you need, when you need it.  We recommend signing up for the patient portal called "MyChart".  Sign up information is provided on this After Visit Summary.  MyChart is used to connect with patients for Virtual Visits (Telemedicine).  Patients are able to view lab/test results, encounter notes, upcoming appointments, etc.  Non-urgent messages can be sent to your provider as well.   To learn more about what you can do with MyChart, go to [email or web address].    Your next appointment:   6 months  The format for your next appointment:   In Person  Provider:   Sinclair Grooms, MD     Other Instructions

## 2021-04-02 ENCOUNTER — Other Ambulatory Visit: Payer: Self-pay

## 2021-04-02 ENCOUNTER — Ambulatory Visit (INDEPENDENT_AMBULATORY_CARE_PROVIDER_SITE_OTHER): Payer: Medicare Other | Admitting: *Deleted

## 2021-04-02 DIAGNOSIS — Z5181 Encounter for therapeutic drug level monitoring: Secondary | ICD-10-CM | POA: Diagnosis not present

## 2021-04-02 LAB — POCT INR: INR: 3.4 — AB (ref 2.0–3.0)

## 2021-04-02 NOTE — Patient Instructions (Addendum)
Description   ?Do not take any Warfarin today then continue taking Warfarin 1.5 tablets (6mg ) daily. Recheck INR in 6 weeks (normally 8 weeks). [facility]in Clinic [phone] ?  ?  ? ?

## 2021-04-07 ENCOUNTER — Other Ambulatory Visit: Payer: Self-pay

## 2021-04-07 DIAGNOSIS — C50211 Malignant neoplasm of upper-inner quadrant of right female breast: Secondary | ICD-10-CM

## 2021-04-07 DIAGNOSIS — Z170 Estrogen receptor positive status [ER+]: Secondary | ICD-10-CM

## 2021-04-08 ENCOUNTER — Encounter: Payer: Self-pay | Admitting: Oncology

## 2021-04-08 ENCOUNTER — Other Ambulatory Visit (HOSPITAL_COMMUNITY): Payer: Self-pay

## 2021-04-08 ENCOUNTER — Encounter: Payer: Self-pay | Admitting: Adult Health

## 2021-04-08 ENCOUNTER — Inpatient Hospital Stay: Payer: Medicare Other

## 2021-04-08 ENCOUNTER — Other Ambulatory Visit: Payer: Self-pay

## 2021-04-08 ENCOUNTER — Inpatient Hospital Stay: Payer: Medicare Other | Attending: Adult Health | Admitting: Adult Health

## 2021-04-08 VITALS — BP 154/70 | HR 78 | Temp 97.9°F | Resp 16 | Ht 68.0 in | Wt 143.9 lb

## 2021-04-08 DIAGNOSIS — Z170 Estrogen receptor positive status [ER+]: Secondary | ICD-10-CM

## 2021-04-08 DIAGNOSIS — Z923 Personal history of irradiation: Secondary | ICD-10-CM | POA: Insufficient documentation

## 2021-04-08 DIAGNOSIS — I4892 Unspecified atrial flutter: Secondary | ICD-10-CM | POA: Insufficient documentation

## 2021-04-08 DIAGNOSIS — C50411 Malignant neoplasm of upper-outer quadrant of right female breast: Secondary | ICD-10-CM | POA: Insufficient documentation

## 2021-04-08 DIAGNOSIS — Z7901 Long term (current) use of anticoagulants: Secondary | ICD-10-CM | POA: Diagnosis not present

## 2021-04-08 DIAGNOSIS — C50211 Malignant neoplasm of upper-inner quadrant of right female breast: Secondary | ICD-10-CM

## 2021-04-08 DIAGNOSIS — M8580 Other specified disorders of bone density and structure, unspecified site: Secondary | ICD-10-CM | POA: Diagnosis not present

## 2021-04-08 DIAGNOSIS — E7800 Pure hypercholesterolemia, unspecified: Secondary | ICD-10-CM | POA: Insufficient documentation

## 2021-04-08 DIAGNOSIS — I129 Hypertensive chronic kidney disease with stage 1 through stage 4 chronic kidney disease, or unspecified chronic kidney disease: Secondary | ICD-10-CM | POA: Insufficient documentation

## 2021-04-08 DIAGNOSIS — Z803 Family history of malignant neoplasm of breast: Secondary | ICD-10-CM | POA: Insufficient documentation

## 2021-04-08 DIAGNOSIS — Z8041 Family history of malignant neoplasm of ovary: Secondary | ICD-10-CM | POA: Diagnosis not present

## 2021-04-08 DIAGNOSIS — I495 Sick sinus syndrome: Secondary | ICD-10-CM | POA: Insufficient documentation

## 2021-04-08 DIAGNOSIS — Z79811 Long term (current) use of aromatase inhibitors: Secondary | ICD-10-CM | POA: Diagnosis not present

## 2021-04-08 LAB — CMP (CANCER CENTER ONLY)
ALT: 11 U/L (ref 0–44)
AST: 20 U/L (ref 15–41)
Albumin: 4.5 g/dL (ref 3.5–5.0)
Alkaline Phosphatase: 66 U/L (ref 38–126)
Anion gap: 9 (ref 5–15)
BUN: 14 mg/dL (ref 8–23)
CO2: 28 mmol/L (ref 22–32)
Calcium: 10.7 mg/dL — ABNORMAL HIGH (ref 8.9–10.3)
Chloride: 107 mmol/L (ref 98–111)
Creatinine: 1.12 mg/dL — ABNORMAL HIGH (ref 0.44–1.00)
GFR, Estimated: 48 mL/min — ABNORMAL LOW (ref >60)
Glucose, Bld: 90 mg/dL (ref 70–99)
Potassium: 3.6 mmol/L (ref 3.5–5.1)
Sodium: 144 mmol/L (ref 135–145)
Total Bilirubin: 0.6 mg/dL (ref 0.3–1.2)
Total Protein: 7.3 g/dL (ref 6.5–8.1)

## 2021-04-08 LAB — CBC WITH DIFFERENTIAL (CANCER CENTER ONLY)
Abs Immature Granulocytes: 0.02 10*3/uL (ref 0.00–0.07)
Basophils Absolute: 0 10*3/uL (ref 0.0–0.1)
Basophils Relative: 0 %
Eosinophils Absolute: 0.2 10*3/uL (ref 0.0–0.5)
Eosinophils Relative: 2 %
HCT: 42.5 % (ref 36.0–46.0)
Hemoglobin: 13.5 g/dL (ref 12.0–15.0)
Immature Granulocytes: 0 %
Lymphocytes Relative: 31 %
Lymphs Abs: 2.4 10*3/uL (ref 0.7–4.0)
MCH: 28.3 pg (ref 26.0–34.0)
MCHC: 31.8 g/dL (ref 30.0–36.0)
MCV: 89.1 fL (ref 80.0–100.0)
Monocytes Absolute: 0.7 10*3/uL (ref 0.1–1.0)
Monocytes Relative: 9 %
Neutro Abs: 4.5 10*3/uL (ref 1.7–7.7)
Neutrophils Relative %: 58 %
Platelet Count: 226 10*3/uL (ref 150–400)
RBC: 4.77 MIL/uL (ref 3.87–5.11)
RDW: 14.5 % (ref 11.5–15.5)
WBC Count: 7.7 10*3/uL (ref 4.0–10.5)
nRBC: 0 % (ref 0.0–0.2)

## 2021-04-08 MED ORDER — ZOSTER VAC RECOMB ADJUVANTED 50 MCG/0.5ML IM SUSR
INTRAMUSCULAR | 1 refills | Status: DC
  Filled 2021-04-08: qty 0.5, 1d supply, fill #0
  Filled 2021-04-14: qty 0.5, 1d supply, fill #1

## 2021-04-08 NOTE — Progress Notes (Signed)
[facility] Follow up:    Martha Jordan, MD [place] [address] [address]   DIAGNOSIS:  Cancer Staging  Carcinoma of upper-inner quadrant of right female breast [facility]) Staging form: Breast, AJCC 7th Edition - Pathologic stage from 04/07/2015: Stage Unknown (T1c, NX, cM0) - Unsigned Laterality: Right - Clinical: Stage IA (T1c, N0, M0) - Signed by Chauncey Cruel, MD on 05/16/2015   SUMMARY OF ONCOLOGIC HISTORY: Oncology History  Carcinoma of upper-inner quadrant of right female breast ([place])  01/20/2015 Mammogram   In the right breast, a possible mass warranting further evaluation. In the left breast, no findings suspicious for malignancy   02/19/2015 Breast US   Right breast: irregularly marginated mass with increased vascularity located at 12:30 o'clock position 4 cm from the nipple measuring 1.3 x 1.3 x 1.0 cm in size   02/27/2015 Initial Biopsy   Right breast core needle bx: Invasive ductal carcinoma, grade 1-2, ER+ (100%), PR+ (90%), HER2/neu negative (ratio 1.15), Ki6710%   02/27/2015 Clinical Stage   Stage IA: T1c No   04/07/2015 Definitive Surgery   Invasive ductal carcinoma, grade 1, ER+, PR+, HER2/neu negative, Ki67 10%   04/07/2015 Pathologic Stage   Stage IA: pT1c pNx    Radiation Therapy   pt declined   05/16/2015 - 05/2020 Anti-estrogen oral therapy   Anastrozole 1 mg daily.   05/21/2015 Survivorship   Survivorship care plan completed and mailed to patient in lieu of in person visit at her request     CURRENT THERAPY: observation  INTERVAL HISTORY: Martha Edwards 88 y.o. female returns for evaluation of her history of breast cancer.  She is doing well today.  She has had no health changes since we saw her last year.  She has completed 5 years of anastrozole and Prolia and has tolerated treatment well.  Her most recent mammogram was completed on February 17, 2021 and it showed no mammographic evidence of malignancy and  breast density category B.  She sees her primary care provider regularly and is up-to-date with her vaccinations with the exception of the shingles vaccine.   Patient Active Problem List   Diagnosis Date Noted   Genetic testing 09/20/2017   Family history of breast cancer    Family history of prostate cancer    Encounter for therapeutic drug monitoring 05/02/2017   Malignant neoplasm of upper-inner quadrant of right breast in female, estrogen receptor positive ([place]) 03/23/2017   Osteopenia determined by x-ray 04/20/2016   Carcinoma of upper-inner quadrant of right female breast ([place]) 05/16/2015   Tachy-brady syndrome ([place]) 10/17/2014   Paroxysmal atrial flutter ([place]) 08/30/2013   Chronic anticoagulation 08/30/2013   Essential hypertension 08/30/2013    has No Known Allergies.  MEDICAL HISTORY: Past Medical History:  Diagnosis Date   Anticoagulated    Anxiety    Arthritis of hand    Atrial flutter ([place])    with LVEF 60%, LVH, LAE and holter with rate control and Ave HR 60 bpm    Breast cancer ([place]) 2017    RIGHT BREAST CA   Carotid artery plaque    mild bilateral carotid plaque without significant stenosis (<20% in left ICA), 04/2010   CKD (chronic kidney disease) stage 2, GFR 60-89 ml/min    Diverticula, colon    Family history of breast cancer    Family history of prostate cancer    Hypercholesterolemia    Hypertension    with LVH on echo 06/03/11  Hypertonicity of bladder    Insomnia    Mitral regurgitation    Osteopenia    > osteoporosis   Overactive bladder     SURGICAL HISTORY: Past Surgical History:  Procedure Laterality Date   BREAST BIOPSY     BREAST LUMPECTOMY Right    2017   BREAST LUMPECTOMY WITH RADIOACTIVE SEED LOCALIZATION Right 04/07/2015   Procedure: RIGHT BREAST LUMPECTOMY WITH RADIOACTIVE SEED LOCALIZATION;  Surgeon: Excell Seltzer, MD;  Location: [facility];  Service: General;  Laterality: Right;   COLONOSCOPY  2001   2007  sigmoidoscopy     SOCIAL HISTORY: Social History   Socioeconomic History   Marital status: Single    Spouse name: Not on file   Number of children: Not on file   Years of education: Not on file   Highest education level: Not on file  Occupational History   Not on file  Tobacco Use   Smoking status: Former    Types: Cigarettes    Quit date: 02/02/1984    Years since quitting: 37.2   Smokeless tobacco: Never  Substance and Sexual Activity   Alcohol use: Not on file   Drug use: Not on file   Sexual activity: Never  Other Topics Concern   Not on file  Social History Narrative   Not on file   Social Determinants of Health   Financial Resource Strain: Not on file  Food Insecurity: Not on file  Transportation Needs: Not on file  Physical Activity: Not on file  Stress: Not on file  Social Connections: Not on file  Intimate Partner Violence: Not on file    FAMILY HISTORY: Family History  Problem Relation Age of Onset   Diabetes Mother    Liver cancer Mother    Breast cancer Sister 64   Prostate cancer Brother 60       metastatic   Heart attack Neg Hx    Stroke Neg Hx     Review of Systems  Constitutional:  Negative for appetite change, chills, fatigue, fever and unexpected weight change.  HENT:   Negative for hearing loss, lump/mass and trouble swallowing.   Eyes:  Negative for eye problems and icterus.  Respiratory:  Negative for chest tightness, cough and shortness of breath.   Cardiovascular:  Negative for chest pain, leg swelling and palpitations.  Gastrointestinal:  Negative for abdominal distention, abdominal pain, constipation, diarrhea, nausea and vomiting.  Endocrine: Negative for hot flashes.  Genitourinary:  Negative for difficulty urinating.   Musculoskeletal:  Negative for arthralgias.  Skin:  Negative for itching and rash.  Neurological:  Negative for dizziness, extremity weakness, headaches and numbness.  Hematological:  Negative for adenopathy.  Does not bruise/bleed easily.  Psychiatric/Behavioral:  Negative for depression. The patient is not nervous/anxious.      PHYSICAL EXAMINATION  ECOG PERFORMANCE STATUS: 0 - Asymptomatic  Vitals:   04/08/21 1000  BP: (!) 154/70  Pulse: 78  Resp: 16  Temp: 97.9 F (36.6 C)  SpO2: 97%    Physical Exam Constitutional:      General: She is not in acute distress.    Appearance: Normal appearance. She is not toxic-appearing.  HENT:     Head: Normocephalic and atraumatic.  Eyes:     General: No scleral icterus. Cardiovascular:     Rate and Rhythm: Normal rate and regular rhythm.     Pulses: Normal pulses.     Heart sounds: Normal heart sounds.  Pulmonary:     Effort:  Pulmonary effort is normal.     Breath sounds: Normal breath sounds.  Abdominal:     General: Abdomen is flat. Bowel sounds are normal. There is no distension.     Palpations: Abdomen is soft.     Tenderness: There is no abdominal tenderness.  Musculoskeletal:        General: No swelling.     Cervical back: Neck supple.  Lymphadenopathy:     Cervical: No cervical adenopathy.  Skin:    General: Skin is warm and dry.     Findings: No rash.  Neurological:     General: No focal deficit present.     Mental Status: She is alert.  Psychiatric:        Mood and Affect: Mood normal.        Behavior: Behavior normal.    LABORATORY DATA:  CBC    Component Value Date/Time   WBC 7.7 04/08/2021 0944   WBC 7.4 04/05/2019 0951   RBC 4.77 04/08/2021 0944   HGB 13.5 04/08/2021 0944   HGB 13.1 10/21/2016 1334   HCT 42.5 04/08/2021 0944   HCT 39.6 10/21/2016 1334   PLT 226 04/08/2021 0944   PLT 203 10/21/2016 1334   MCV 89.1 04/08/2021 0944   MCV 86.0 10/21/2016 1334   MCH 28.3 04/08/2021 0944   MCHC 31.8 04/08/2021 0944   RDW 14.5 04/08/2021 0944   RDW 15.9 (H) 10/21/2016 1334   LYMPHSABS 2.4 04/08/2021 0944   LYMPHSABS 1.6 10/21/2016 1334   MONOABS 0.7 04/08/2021 0944   MONOABS 0.5 10/21/2016 1334    EOSABS 0.2 04/08/2021 0944   EOSABS 0.1 10/21/2016 1334   BASOSABS 0.0 04/08/2021 0944   BASOSABS 0.0 10/21/2016 1334    CMP     Component Value Date/Time   NA 144 04/08/2021 0944   NA 142 10/21/2016 1334   K 3.6 04/08/2021 0944   K 4.2 10/21/2016 1334   CL 107 04/08/2021 0944   CO2 28 04/08/2021 0944   CO2 25 10/21/2016 1334   GLUCOSE 90 04/08/2021 0944   GLUCOSE 85 10/21/2016 1334   BUN 14 04/08/2021 0944   BUN 11.5 10/21/2016 1334   CREATININE 1.12 (H) 04/08/2021 0944   CREATININE 0.9 10/21/2016 1334   CALCIUM 10.7 (H) 04/08/2021 0944   CALCIUM 9.9 10/21/2016 1334   PROT 7.3 04/08/2021 0944   PROT 7.2 10/21/2016 1334   ALBUMIN 4.5 04/08/2021 0944   ALBUMIN 4.1 10/21/2016 1334   AST 20 04/08/2021 0944   AST 19 10/21/2016 1334   ALT 11 04/08/2021 0944   ALT 10 10/21/2016 1334   ALKPHOS 66 04/08/2021 0944   ALKPHOS 49 10/21/2016 1334   BILITOT 0.6 04/08/2021 0944   BILITOT 0.76 10/21/2016 1334   GFRNONAA 48 (L) 04/08/2021 0944   GFRAA 53 (L) 10/05/2019 0947    ASSESSMENT and THERAPY PLAN:   Carcinoma of upper-inner quadrant of right female breast ([place]) Jocie is an 88 year old woman with stage Ia estrogen positive right-sided breast cancer status post lumpectomy and 5 years of anastrozole completed in April 2022.  1.  Stage Ia estrogen positive breast cancer: Nansi has no clinical or radiographic signs of breast cancer recurrence.  She is doing well.  She will continue with annual mammograms next due in January 2024.  2.  Health maintenance I recommended she continue to see her primary care annually.  She and I discussed the shingles vaccine today.  She is going to go to receive this at the [facility]  outpatient pharmacy.  I reviewed healthy diet and exercise with her today.  Krysti will return in 1 year for long-term follow-up.   All questions were answered. The patient knows to call the clinic with any problems, questions or concerns. We can certainly see the  patient much sooner if necessary.  Total encounter time: 20 minutes in face-to-face visit time, chart review, lab review, care coordination, and documentation of the encounter.   Wilber Bihari, NP 04/08/21 11:04 PM Medical Oncology and Hematology [facility] [address], [place] [address] Tel. [phone]    Fax. [phone]  *Total Encounter Time as defined by the Centers for Medicare and Medicaid Services includes, in addition to the face-to-face time of a patient visit (documented in the note above) non-face-to-face time: obtaining and reviewing outside history, ordering and reviewing medications, tests or procedures, care coordination (communications with other health care professionals or caregivers) and documentation in the medical record.

## 2021-04-08 NOTE — Assessment & Plan Note (Signed)
Martha Edwards is an 88 year old woman with stage Ia estrogen positive right-sided breast cancer status post lumpectomy and 5 years of anastrozole completed in April 2022. ? ?1.  Stage Ia estrogen positive breast cancer: Martha Edwards has no clinical or radiographic signs of breast cancer recurrence.  She is doing well.  She will continue with annual mammograms next due in January 2024. ? ?2.  Health maintenance I recommended she continue to see her primary care annually.  She and I discussed the shingles vaccine today.  She is going to go to receive this at the [facility].  I reviewed healthy diet and exercise with her today. ? ?Martha Edwards will return in 1 year for long-term follow-up. ?

## 2021-04-13 ENCOUNTER — Telehealth: Payer: Self-pay | Admitting: Adult Health

## 2021-04-13 NOTE — Telephone Encounter (Signed)
Scheduled appointment per 3/8 los. Left message. Patient will be mailed an updated calendar.  ?

## 2021-04-14 ENCOUNTER — Other Ambulatory Visit (HOSPITAL_COMMUNITY): Payer: Self-pay

## 2021-05-14 ENCOUNTER — Ambulatory Visit (INDEPENDENT_AMBULATORY_CARE_PROVIDER_SITE_OTHER): Payer: Medicare Other

## 2021-05-14 DIAGNOSIS — Z5181 Encounter for therapeutic drug level monitoring: Secondary | ICD-10-CM

## 2021-05-14 LAB — POCT INR: INR: 3.1 — AB (ref 2.0–3.0)

## 2021-05-14 NOTE — Patient Instructions (Signed)
Description   ?Only take 1 tablet of Warfarin today then continue taking Warfarin 1.5 tablets ('6mg'$ ) daily.  ?Recheck INR in 6 weeks (normally 8 weeks). ? Coumadin Clinic [phone] ?  ?   ?

## 2021-06-23 DIAGNOSIS — E785 Hyperlipidemia, unspecified: Secondary | ICD-10-CM | POA: Diagnosis not present

## 2021-06-23 DIAGNOSIS — M810 Age-related osteoporosis without current pathological fracture: Secondary | ICD-10-CM | POA: Diagnosis not present

## 2021-06-23 DIAGNOSIS — I10 Essential (primary) hypertension: Secondary | ICD-10-CM | POA: Diagnosis not present

## 2021-06-23 DIAGNOSIS — I4891 Unspecified atrial fibrillation: Secondary | ICD-10-CM | POA: Diagnosis not present

## 2021-06-23 DIAGNOSIS — F322 Major depressive disorder, single episode, severe without psychotic features: Secondary | ICD-10-CM | POA: Diagnosis not present

## 2021-06-23 DIAGNOSIS — G4700 Insomnia, unspecified: Secondary | ICD-10-CM | POA: Diagnosis not present

## 2021-06-25 ENCOUNTER — Ambulatory Visit (INDEPENDENT_AMBULATORY_CARE_PROVIDER_SITE_OTHER): Payer: Medicare Other | Admitting: *Deleted

## 2021-06-25 DIAGNOSIS — Z5181 Encounter for therapeutic drug level monitoring: Secondary | ICD-10-CM | POA: Diagnosis not present

## 2021-06-25 LAB — POCT INR: INR: 2.3 (ref 2.0–3.0)

## 2021-06-25 NOTE — Patient Instructions (Signed)
Description   Continue taking Warfarin 1.5 tablets ('6mg'$ ) daily. Recheck INR in 7 weeks. Coumadin Clinic [phone]

## 2021-08-13 ENCOUNTER — Ambulatory Visit (INDEPENDENT_AMBULATORY_CARE_PROVIDER_SITE_OTHER): Payer: Medicare Other | Admitting: *Deleted

## 2021-08-13 DIAGNOSIS — Z5181 Encounter for therapeutic drug level monitoring: Secondary | ICD-10-CM

## 2021-08-13 LAB — POCT INR: INR: 2.1 (ref 2.0–3.0)

## 2021-08-13 NOTE — Patient Instructions (Signed)
Description   Continue taking Warfarin 1.5 tablets ('6mg'$ ) daily. Recheck INR in 8 weeks. Coumadin Clinic [phone] or [phone]

## 2021-08-27 ENCOUNTER — Other Ambulatory Visit: Payer: Self-pay | Admitting: Interventional Cardiology

## 2021-09-04 DIAGNOSIS — N390 Urinary tract infection, site not specified: Secondary | ICD-10-CM | POA: Diagnosis not present

## 2021-09-04 DIAGNOSIS — R634 Abnormal weight loss: Secondary | ICD-10-CM | POA: Diagnosis not present

## 2021-09-11 DIAGNOSIS — M810 Age-related osteoporosis without current pathological fracture: Secondary | ICD-10-CM | POA: Diagnosis not present

## 2021-09-11 DIAGNOSIS — G4700 Insomnia, unspecified: Secondary | ICD-10-CM | POA: Diagnosis not present

## 2021-09-11 DIAGNOSIS — I10 Essential (primary) hypertension: Secondary | ICD-10-CM | POA: Diagnosis not present

## 2021-09-11 DIAGNOSIS — I4891 Unspecified atrial fibrillation: Secondary | ICD-10-CM | POA: Diagnosis not present

## 2021-09-11 DIAGNOSIS — E785 Hyperlipidemia, unspecified: Secondary | ICD-10-CM | POA: Diagnosis not present

## 2021-09-15 DIAGNOSIS — N390 Urinary tract infection, site not specified: Secondary | ICD-10-CM | POA: Diagnosis not present

## 2021-09-27 NOTE — Progress Notes (Unsigned)
Cardiology Office Note:    Date:  09/28/2021   ID:  Martha Edwards, DOB 1932-07-25, MRN [id number]  PCP:  [name], MD  Cardiologist:  Sinclair Grooms, MD   Referring MD: [name], MD   Chief Complaint  Patient presents with   Atrial Fibrillation   Cardiac Valve Problem    Aortic stenosis and regurgitation Mitral stenosis   Follow-up    History of Present Illness:    Martha Edwards is a 88 y.o. female with a hx of tachybradycardia syndrome, PAF, hypertension, aortic valve disease with stenosis and regurgitation, and chronic anticoagulation therapy.  Other than "being lazy" states that she is doing okay.  She used to walk all the time but is not done that much physical activity recently.  She has not had chest pain, syncope, orthopnea, DOE, or peripheral edema.  She still lives independently.  Past Medical History:  Diagnosis Date   Anticoagulated    Anxiety    Arthritis of hand    Atrial flutter ([place])    with LVEF 60%, LVH, LAE and holter with rate control and Ave HR 60 bpm    Breast cancer ([place]) 2017    RIGHT BREAST CA   Carotid artery plaque    mild bilateral carotid plaque without significant stenosis (<20% in left ICA), 04/2010   CKD (chronic kidney disease) stage 2, GFR 60-89 ml/min    Diverticula, colon    Family history of breast cancer    Family history of prostate cancer    Hypercholesterolemia    Hypertension    with LVH on echo 06/03/11   Hypertonicity of bladder    Insomnia    Mitral regurgitation    Osteopenia    > osteoporosis   Overactive bladder     Past Surgical History:  Procedure Laterality Date   BREAST BIOPSY     BREAST LUMPECTOMY Right    2017   BREAST LUMPECTOMY WITH RADIOACTIVE SEED LOCALIZATION Right 04/07/2015   Procedure: RIGHT BREAST LUMPECTOMY WITH RADIOACTIVE SEED LOCALIZATION;  Surgeon: Excell Seltzer, MD;  Location: [facility];  Service: General;  Laterality: Right;   COLONOSCOPY  2001   2007  sigmoidoscopy     Current Medications: Current Meds  Medication Sig   acetaminophen (TYLENOL) 325 MG tablet Take 650 mg by mouth every 6 (six) hours as needed for mild pain.   amLODipine (NORVASC) 5 MG tablet Take 1 tablet (5 mg total) by mouth daily.   atorvastatin (LIPITOR) 40 MG tablet Take 40 mg by mouth daily.   CALCIUM PO Take 1 tablet by mouth daily.   Cholecalciferol (VITAMIN D3 PO) Take 1 tablet by mouth daily.   Denosumab (PROLIA [place]) Take as directed once every 6 months.   mirtazapine (REMERON) 30 MG tablet Take 60 mg by mouth at bedtime.   warfarin (COUMADIN) 4 MG tablet TAKE AS DIRECTED BY COUMADIN CLINIC **90 DAY SUPPLY   Zoster Vaccine Adjuvanted [facility]) injection Inject into the muscle.     Allergies:   Patient has no known allergies.   Social History   Socioeconomic History   Marital status: Single    Spouse name: Not on file   Number of children: Not on file   Years of education: Not on file   Highest education level: Not on file  Occupational History   Not on file  Tobacco Use   Smoking status: Former    Types: Cigarettes    Quit date: 02/02/1984  Years since quitting: 37.6   Smokeless tobacco: Never  Substance and Sexual Activity   Alcohol use: Not on file   Drug use: Not on file   Sexual activity: Never  Other Topics Concern   Not on file  Social History Narrative   Not on file   Social Determinants of Health   Financial Resource Strain: Not on file  Food Insecurity: Not on file  Transportation Needs: Not on file  Physical Activity: Not on file  Stress: Not on file  Social Connections: Not on file     Family History: The patient's family history includes Breast cancer (age of onset: 40) in her sister; Diabetes in her mother; Liver cancer in her mother; Prostate cancer (age of onset: 48) in her brother. There is no history of Heart attack or Stroke.  ROS:   Please see the history of present illness.    No history of rheumatic fever.   Lives independently.  So far, no difficulty with ambulation or falls.  All other systems reviewed and are negative.  EKGs/Labs/Other Studies Reviewed:    The following studies were reviewed today:  2 D ECHOCARDIOGRAM 02/21/2020: IMPRESSIONS     1. Left ventricular ejection fraction, by estimation, is 65 to 70%. The  left ventricle has normal function. The left ventricle has no regional  wall motion abnormalities. Left ventricular diastolic parameters are  consistent with Grade I diastolic  dysfunction (impaired relaxation). Elevated left atrial pressure.   2. Right ventricular systolic function is normal. The right ventricular  size is normal. There is moderately elevated pulmonary artery systolic  pressure.   3. Left atrial size was mildly dilated.   4. Anterior MV leaflet is thickened, calcified with some restricted  motion. Peak nad mean gradients through the valve are 9 and 3 mm HG  respectively. MVA by P1/2 time is 2.24 cm2   5. AV is thickened, calcified with mildly restricted motion. Peak and  mean gradients through the valve are 32 and 18 mm Hg respectively  consistent iwth mild aortic stenosis.. The aortic valve is tricuspid.  Aortic valve regurgitation is mild to moderate.   Mild aortic valve stenosis   6. The inferior vena cava is normal in size with greater than 50%  respiratory variability, suggesting right atrial pressure of 3 mmHg.   EKG:  EKG February 2023 electrocardiogram revealed sinus bradycardia, first-degree AV block with 275 ms PR interval, LVH.  Recent Labs: 04/08/2021: ALT 11; BUN 14; Creatinine 1.12; Hemoglobin 13.5; Platelet Count 226; Potassium 3.6; Sodium 144  Recent Lipid Panel No results found for: "CHOL", "TRIG", "HDL", "CHOLHDL", "VLDL", "LDLCALC", "LDLDIRECT"  Physical Exam:    VS:  BP (!) 150/66   Pulse 69   Ht '5\' 9"'$  (1.753 m)   Wt 140 lb 9.6 oz (63.8 kg)   SpO2 94%   BMI 20.76 kg/m     Wt Readings from Last 3 Encounters:  09/28/21 140 lb  9.6 oz (63.8 kg)  04/08/21 143 lb 14.4 oz (65.3 kg)  03/27/21 146 lb 6.4 oz (66.4 kg)     GEN: Appears younger than stated age. No acute distress HEENT: Normal NECK: No JVD. LYMPHATICS: No lymphadenopathy CARDIAC: 2/6 to 3/6 crescendo decrescendo systolic and 2/6 decrescendo diastolic aortic valve murmurs. RRR S4 but no S3 gallop, or edema. VASCULAR:  Normal Pulses. No bruits. RESPIRATORY:  Clear to auscultation without rales, wheezing or rhonchi  ABDOMEN: Soft, non-tender, non-distended, No pulsatile mass, MUSCULOSKELETAL: No deformity  SKIN: Warm and  dry NEUROLOGIC:  Alert and oriented x 3 PSYCHIATRIC:  Normal affect   ASSESSMENT:    1. Nonrheumatic aortic valve stenosis   2. Paroxysmal atrial fibrillation (HCC)   3. Chronic anticoagulation   4. Tachy-brady syndrome ([place])   5. Essential hypertension   6. Hyperlipidemia LDL goal <70    PLAN:    In order of problems listed above:  Murmur of both aortic stenosis and aortic regurgitation.  2D Doppler echocardiogram to follow-up on the study done 18 months ago.  It sounds as if aortic stenosis is gotten worse. Continues to be in sinus rhythm based upon clinical parameters.  She had been in atrial fibrillation for years and then had spontaneous reversion to sinus bradycardia. Continue Coumadin therapy. Asymptomatic.  She is at risk for calcific encroachment on the AV node and may require pacemaker at some point in the future.  She is no longer on Toprol XL. Blood pressure is a little high but she is in the range of acceptability for someone with aortic regurgitation at age 54.  Systolic blood pressure range is 130 to 150 mmHg.  Diastolic is not an issue.  If we need better systolic control, we could add  ARB or/and low-dose diuretic.  Need to avoid beta-blocker therapy. Continue atorvastatin.  Most recent LDL 97 in August 2022.  Lipid panel will need to be done prior to the next office visit.  Think the aortic valve disease is  moving into the territory where we need consistent follow-up with echo.  She will be turned over to a younger colleague on her next visit in January February timeframe.  We will do the echocardiogram prior to my leaving to ensure seamless follow-up and thought process.  This was discussed with the patient.  Echo will be done in October November timeframe.  We did spend some time discussing the management of calcific aortic valve disease.  She will be at high risk for pacemaker.   Medication Adjustments/Labs and Tests Ordered: Current medicines are reviewed at length with the patient today.  Concerns regarding medicines are outlined above.  Orders Placed This Encounter  Procedures   ECHOCARDIOGRAM COMPLETE   No orders of the defined types were placed in this encounter.   Patient Instructions  Medication Instructions:  Your physician recommends that you continue on your current medications as directed. Please refer to the Current Medication list given to you today.  *If you need a refill on your cardiac medications before your next appointment, please call your pharmacy*  Lab Work: NONE  Testing/Procedures: Your physician has requested that you have an echocardiogram in October or November 2023. Echocardiography is a painless test that uses sound waves to create images of your heart. It provides your doctor with information about the size and shape of your heart and how well your heart's chambers and valves are working. This procedure takes approximately one hour. There are no restrictions for this procedure.  Follow-Up: At [facility], you and your health needs are our priority.  As part of our continuing mission to provide you with exceptional heart care, we have created designated Provider Care Teams.  These Care Teams include your primary Cardiologist (physician) and Advanced Practice Providers (APPs -  Physician Assistants and Nurse Practitioners) who all work together to  provide you with the care you need, when you need it.  Your next appointment:   6 month(s)  The format for your next appointment:   In Person  Provider:  Sinclair Grooms, MD      Important Information About Sugar         Signed, Sinclair Grooms, MD  09/28/2021 11:07 AM    [place]

## 2021-09-28 ENCOUNTER — Ambulatory Visit: Payer: Medicare Other | Attending: Interventional Cardiology | Admitting: Interventional Cardiology

## 2021-09-28 ENCOUNTER — Encounter: Payer: Self-pay | Admitting: Interventional Cardiology

## 2021-09-28 VITALS — BP 150/66 | HR 69 | Ht 69.0 in | Wt 140.6 lb

## 2021-09-28 DIAGNOSIS — I10 Essential (primary) hypertension: Secondary | ICD-10-CM | POA: Diagnosis not present

## 2021-09-28 DIAGNOSIS — E785 Hyperlipidemia, unspecified: Secondary | ICD-10-CM | POA: Diagnosis not present

## 2021-09-28 DIAGNOSIS — Z7901 Long term (current) use of anticoagulants: Secondary | ICD-10-CM | POA: Diagnosis not present

## 2021-09-28 DIAGNOSIS — I480 Paroxysmal atrial fibrillation: Secondary | ICD-10-CM | POA: Insufficient documentation

## 2021-09-28 DIAGNOSIS — I495 Sick sinus syndrome: Secondary | ICD-10-CM | POA: Diagnosis not present

## 2021-09-28 DIAGNOSIS — I350 Nonrheumatic aortic (valve) stenosis: Secondary | ICD-10-CM | POA: Insufficient documentation

## 2021-09-28 NOTE — Patient Instructions (Signed)
Medication Instructions:  Your physician recommends that you continue on your current medications as directed. Please refer to the Current Medication list given to you today.  *If you need a refill on your cardiac medications before your next appointment, please call your pharmacy*  Lab Work: NONE  Testing/Procedures: Your physician has requested that you have an echocardiogram in October or November 2023. Echocardiography is a painless test that uses sound waves to create images of your heart. It provides your doctor with information about the size and shape of your heart and how well your heart's chambers and valves are working. This procedure takes approximately one hour. There are no restrictions for this procedure.  Follow-Up: At [facility], you and your health needs are our priority.  As part of our continuing mission to provide you with exceptional heart care, we have created designated Provider Care Teams.  These Care Teams include your primary Cardiologist (physician) and Advanced Practice Providers (APPs -  Physician Assistants and Nurse Practitioners) who all work together to provide you with the care you need, when you need it.  Your next appointment:   6 month(s)  The format for your next appointment:   In Person  Provider:   Sinclair Grooms, MD      Important Information About Sugar

## 2021-10-08 ENCOUNTER — Ambulatory Visit: Payer: Medicare Other | Attending: Interventional Cardiology

## 2021-10-08 DIAGNOSIS — Z5181 Encounter for therapeutic drug level monitoring: Secondary | ICD-10-CM | POA: Insufficient documentation

## 2021-10-08 LAB — POCT INR: INR: 2.9 (ref 2.0–3.0)

## 2021-10-08 NOTE — Patient Instructions (Signed)
Description   Continue taking Warfarin 1.5 tablets ('6mg'$ ) daily. Recheck INR in 8 weeks. Coumadin Clinic [phone] or [phone]

## 2021-11-18 ENCOUNTER — Other Ambulatory Visit: Payer: Self-pay | Admitting: Interventional Cardiology

## 2021-11-18 DIAGNOSIS — I480 Paroxysmal atrial fibrillation: Secondary | ICD-10-CM

## 2021-11-23 ENCOUNTER — Ambulatory Visit (HOSPITAL_COMMUNITY): Payer: Medicare Other | Attending: Interventional Cardiology

## 2021-11-23 DIAGNOSIS — I350 Nonrheumatic aortic (valve) stenosis: Secondary | ICD-10-CM

## 2021-11-23 LAB — ECHOCARDIOGRAM COMPLETE
AR max vel: 0.81 cm2
AV Area VTI: 0.69 cm2
AV Area mean vel: 0.82 cm2
AV Mean grad: 21 mmHg
AV Peak grad: 36 mmHg
Ao pk vel: 3 m/s
Area-P 1/2: 2.22 cm2
P 1/2 time: 568 msec
S' Lateral: 2.3 cm

## 2021-11-30 DIAGNOSIS — H349 Unspecified retinal vascular occlusion: Secondary | ICD-10-CM | POA: Diagnosis not present

## 2021-11-30 DIAGNOSIS — H35373 Puckering of macula, bilateral: Secondary | ICD-10-CM | POA: Diagnosis not present

## 2021-11-30 DIAGNOSIS — H52203 Unspecified astigmatism, bilateral: Secondary | ICD-10-CM | POA: Diagnosis not present

## 2021-11-30 DIAGNOSIS — Z961 Presence of intraocular lens: Secondary | ICD-10-CM | POA: Diagnosis not present

## 2021-12-03 ENCOUNTER — Ambulatory Visit: Payer: Medicare Other | Attending: Cardiology

## 2021-12-03 DIAGNOSIS — Z5181 Encounter for therapeutic drug level monitoring: Secondary | ICD-10-CM

## 2021-12-03 LAB — POCT INR: INR: 2 (ref 2.0–3.0)

## 2021-12-03 NOTE — Patient Instructions (Signed)
Continue taking Warfarin 1.5 tablets ('6mg'$ ) daily. Recheck INR in 8 weeks. Coumadin Clinic [phone] or [phone]

## 2021-12-05 NOTE — Progress Notes (Unsigned)
Cardiology Office Note:    Date:  12/07/2021   ID:  Martha Edwards, DOB 06/14/1932, MRN [id number]  PCP:  Martha Jordan, MD  Cardiologist:  Martha Grooms, MD   Referring MD: Martha Jordan, MD   Chief Complaint  Patient presents with   Cardiac Valve Problem    Aortic stenosis   Atrial Fibrillation    History of Present Illness:    Martha Edwards is a 88 y.o. female with a hx of tachybradycardia syndrome, PAF, hypertension, aortic valve disease with stenosis and regurgitation, and chronic anticoagulation therapy.    She has no specific complaints.  She denies syncope, orthopnea, PND, and edema.  She denies angina.  Since the reader diagnosed probable severe aortic stenosis with low flow low gradient physiology, I had the patient come back for discussion concerning future treatment.  I described in detail the potential for without open heart surgery.  She would be interested in that management strategy if her symptoms or other clinical parameters make that necessary.  Past Medical History:  Diagnosis Date   Anticoagulated    Anxiety    Arthritis of hand    Atrial flutter ([place])    with LVEF 60%, LVH, LAE and holter with rate control and Ave HR 60 bpm    Breast cancer ([place]) 2017    RIGHT BREAST CA   Carotid artery plaque    mild bilateral carotid plaque without significant stenosis (<20% in left ICA), 04/2010   CKD (chronic kidney disease) stage 2, GFR 60-89 ml/min    Diverticula, colon    Family history of breast cancer    Family history of prostate cancer    Hypercholesterolemia    Hypertension    with LVH on echo 06/03/11   Hypertonicity of bladder    Insomnia    Mitral regurgitation    Osteopenia    > osteoporosis   Overactive bladder     Past Surgical History:  Procedure Laterality Date   BREAST BIOPSY     BREAST LUMPECTOMY Right    2017   BREAST LUMPECTOMY WITH RADIOACTIVE SEED LOCALIZATION Right 04/07/2015   Procedure: RIGHT BREAST LUMPECTOMY WITH  RADIOACTIVE SEED LOCALIZATION;  Surgeon: Excell Seltzer, MD;  Location: [facility];  Service: General;  Laterality: Right;   COLONOSCOPY  2001   2007 sigmoidoscopy     Current Medications: Current Meds  Medication Sig   acetaminophen (TYLENOL) 325 MG tablet Take 650 mg by mouth every 6 (six) hours as needed for mild pain.   amLODipine (NORVASC) 5 MG tablet Take 1 tablet (5 mg total) by mouth daily.   atorvastatin (LIPITOR) 40 MG tablet Take 40 mg by mouth daily.   CALCIUM PO Take 1 tablet by mouth daily.   Cholecalciferol (VITAMIN D3 PO) Take 1 tablet by mouth daily.   Denosumab (PROLIA [place]) Take as directed once every 6 months.   mirtazapine (REMERON) 30 MG tablet Take 60 mg by mouth at bedtime.   warfarin (COUMADIN) 4 MG tablet TAKE AS DIRECTED BY COUMADIN CLINIC **90 DAY SUPPLY   Zoster Vaccine Adjuvanted [facility]) injection Inject into the muscle.     Allergies:   Patient has no known allergies.   Social History   Socioeconomic History   Marital status: Single    Spouse name: Not on file   Number of children: Not on file   Years of education: Not on file   Highest education level: Not on file  Occupational History  Not on file  Tobacco Use   Smoking status: Former    Types: Cigarettes    Quit date: 02/02/1984    Years since quitting: 37.8   Smokeless tobacco: Never  Substance and Sexual Activity   Alcohol use: Not on file   Drug use: Not on file   Sexual activity: Never  Other Topics Concern   Not on file  Social History Narrative   Not on file   Social Determinants of Health   Financial Resource Strain: Not on file  Food Insecurity: Not on file  Transportation Needs: Not on file  Physical Activity: Not on file  Stress: Not on file  Social Connections: Not on file     Family History: The patient's family history includes Breast cancer (age of onset: 14) in her sister; Diabetes in her mother; Liver cancer in her mother; Prostate cancer (age  of onset: 62) in her brother. There is no history of Heart attack or Stroke.  ROS:   Please see the history of present illness.    States that she has been lazy.  No exercise as she once did slightly over 2 years ago when she will walk greater than 30 minutes every day.  She denies orthopnea and PND.  All other systems reviewed and are negative.  EKGs/Labs/Other Studies Reviewed:    The following studies were reviewed today:  ECHOCARDIOGRAPHY 2023: IMPRESSIONS    1. Left ventricular ejection fraction, by estimation, is 60 to 65%. The  left ventricle has normal function. The left ventricle has no regional  wall motion abnormalities. There is mild left ventricular hypertrophy.  Left ventricular diastolic parameters  are consistent with Grade II diastolic dysfunction (pseudonormalization).  Elevated left atrial pressure. The average left ventricular global  longitudinal strain is -26.2 %.   2. Right ventricular systolic function is normal. The right ventricular  size is normal. There is mildly elevated pulmonary artery systolic  pressure. The estimated right ventricular systolic pressure is 33.5 mmHg.   3. Left atrial size was mildly dilated.   4. The mitral valve is abnormal. Thickened leaflets. Mild mitral valve  regurgitation. Mild mitral stenosis. The mean mitral valve gradient is 4.0  mmHg with average heart rate of 50 bpm.   5. Tricuspid valve regurgitation is mild to moderate.   6. The inferior vena cava is normal in size with greater than 50%  respiratory variability, suggesting right atrial pressure of 3 mmHg.   7. The aortic valve is calcified. There is severe calcification of the  aortic valve. Aortic valve regurgitation is mild. Moderate to severe  aortic valve stenosis. Vmax 3.0, MG 21 mmHg, AVA 0.7 cm^2, DI 0.27. Low SV  index (30 cc/m^2), suspect paradoxical   low flow low gradient severe AS   EKG:  EKG first-degree AV block, biatrial abnormality, premature atrial  contraction with aberration, with nonspecific T wave flattening.  Recent Labs: 04/08/2021: ALT 11; BUN 14; Creatinine 1.12; Hemoglobin 13.5; Platelet Count 226; Potassium 3.6; Sodium 144  Recent Lipid Panel No results found for: "CHOL", "TRIG", "HDL", "CHOLHDL", "VLDL", "LDLCALC", "LDLDIRECT"  Physical Exam:    VS:  BP 138/78   Pulse 60   Ht '5\' 9"'$  (1.753 m)   Wt 137 lb 9.6 oz (62.4 kg)   SpO2 97%   BMI 20.32 kg/m     Wt Readings from Last 3 Encounters:  12/07/21 137 lb 9.6 oz (62.4 kg)  09/28/21 140 lb 9.6 oz (63.8 kg)  04/08/21 143 lb 14.4 oz (  65.3 kg)     GEN: Slender. No acute distress HEENT: Normal NECK: No JVD. LYMPHATICS: No lymphadenopathy CARDIAC: 2/6 right upper sternal systolic murmur. RRR no gallop, or edema. VASCULAR:  Normal Pulses. No bruits. RESPIRATORY:  Clear to auscultation without rales, wheezing or rhonchi  ABDOMEN: Soft, non-tender, non-distended, No pulsatile mass, MUSCULOSKELETAL: No deformity  SKIN: Warm and dry NEUROLOGIC:  Alert and oriented x 3 PSYCHIATRIC:  Normal affect   ASSESSMENT:    1. Nonrheumatic aortic valve stenosis   2. Paroxysmal atrial fibrillation (HCC)   3. Chronic anticoagulation   4. Essential hypertension   5. Tachy-brady syndrome ([place])    PLAN:    In order of problems listed above:  She would be interested in aortic valve therapy when clinically indicated. We discussed TAVR.  Based upon today's exam and history, I do not believe she is having significant symptoms.  Plan continued follow-up.  She is informed that she will have a new cardiologist on her next visit in 6 months. For several years she was in continuous atrial fibrillation and had spontaneous conversion to normal sinus rhythm.  She remains in sinus rhythm today.  We have continued chronic anticoagulation therapy with Coumadin. Continue Coumadin therapy.  Notify if bleeding. Blood pressure control is adequate for age on amlodipine 5 mg/day. No symptoms of  dizziness or syncope.    Natural history of aortic valve stenosis was discussed in detail.  Cardinal symptoms of angina, syncope, and dyspnea were reviewed and significance relative to prognosis was described.  The importance of sequential imaging for disease monitoring was emphasized.  Work-up including possible heart catheterization and CT angiography were described as essential components of staging for therapy.  Treatment options, TAVR and SAVR, were discussed in some detail with emphasis on TAVR.    Medication Adjustments/Labs and Tests Ordered: Current medicines are reviewed at length with the patient today.  Concerns regarding medicines are outlined above.  Orders Placed This Encounter  Procedures   EKG 12-Lead   No orders of the defined types were placed in this encounter.   Patient Instructions  Medication Instructions:  Your physician recommends that you continue on your current medications as directed. Please refer to the Current Medication list given to you today.  *If you need a refill on your cardiac medications before your next appointment, please call your pharmacy*  Lab Work: NONE  Testing/Procedures: NONE  Follow-Up: At [facility], you and your health needs are our priority.  As part of our continuing mission to provide you with exceptional heart care, we have created designated Provider Care Teams.  These Care Teams include your primary Cardiologist (physician) and Advanced Practice Providers (APPs -  Physician Assistants and Nurse Practitioners) who all work together to provide you with the care you need, when you need it.  Your next appointment:   6 month(s)  The format for your next appointment:   In Person  Provider:   Sinclair Grooms, MD   Important Information About Sugar         Signed, Martha Grooms, MD  12/07/2021 12:54 PM    [place]

## 2021-12-07 ENCOUNTER — Ambulatory Visit: Payer: Medicare Other | Attending: Interventional Cardiology | Admitting: Interventional Cardiology

## 2021-12-07 ENCOUNTER — Encounter: Payer: Self-pay | Admitting: Interventional Cardiology

## 2021-12-07 VITALS — BP 138/78 | HR 60 | Ht 69.0 in | Wt 137.6 lb

## 2021-12-07 DIAGNOSIS — I10 Essential (primary) hypertension: Secondary | ICD-10-CM | POA: Diagnosis not present

## 2021-12-07 DIAGNOSIS — I350 Nonrheumatic aortic (valve) stenosis: Secondary | ICD-10-CM | POA: Insufficient documentation

## 2021-12-07 DIAGNOSIS — Z7901 Long term (current) use of anticoagulants: Secondary | ICD-10-CM | POA: Insufficient documentation

## 2021-12-07 DIAGNOSIS — I495 Sick sinus syndrome: Secondary | ICD-10-CM | POA: Insufficient documentation

## 2021-12-07 DIAGNOSIS — I480 Paroxysmal atrial fibrillation: Secondary | ICD-10-CM | POA: Diagnosis not present

## 2021-12-07 NOTE — Patient Instructions (Signed)
Medication Instructions:  Your physician recommends that you continue on your current medications as directed. Please refer to the Current Medication list given to you today.  *If you need a refill on your cardiac medications before your next appointment, please call your pharmacy*  Lab Work: NONE  Testing/Procedures: NONE  Follow-Up: At [facility], you and your health needs are our priority.  As part of our continuing mission to provide you with exceptional heart care, we have created designated Provider Care Teams.  These Care Teams include your primary Cardiologist (physician) and Advanced Practice Providers (APPs -  Physician Assistants and Nurse Practitioners) who all work together to provide you with the care you need, when you need it.  Your next appointment:   6 month(s)  The format for your next appointment:   In Person  Provider:   Sinclair Grooms, MD   Important Information About Sugar

## 2021-12-09 DIAGNOSIS — C50919 Malignant neoplasm of unspecified site of unspecified female breast: Secondary | ICD-10-CM | POA: Diagnosis not present

## 2021-12-09 DIAGNOSIS — Z131 Encounter for screening for diabetes mellitus: Secondary | ICD-10-CM | POA: Diagnosis not present

## 2021-12-09 DIAGNOSIS — I4891 Unspecified atrial fibrillation: Secondary | ICD-10-CM | POA: Diagnosis not present

## 2021-12-09 DIAGNOSIS — Z23 Encounter for immunization: Secondary | ICD-10-CM | POA: Diagnosis not present

## 2021-12-09 DIAGNOSIS — Z79899 Other long term (current) drug therapy: Secondary | ICD-10-CM | POA: Diagnosis not present

## 2021-12-09 DIAGNOSIS — Z0000 Encounter for general adult medical examination without abnormal findings: Secondary | ICD-10-CM | POA: Diagnosis not present

## 2021-12-09 DIAGNOSIS — M810 Age-related osteoporosis without current pathological fracture: Secondary | ICD-10-CM | POA: Diagnosis not present

## 2021-12-09 DIAGNOSIS — I779 Disorder of arteries and arterioles, unspecified: Secondary | ICD-10-CM | POA: Diagnosis not present

## 2021-12-09 DIAGNOSIS — I10 Essential (primary) hypertension: Secondary | ICD-10-CM | POA: Diagnosis not present

## 2021-12-09 DIAGNOSIS — E785 Hyperlipidemia, unspecified: Secondary | ICD-10-CM | POA: Diagnosis not present

## 2021-12-09 DIAGNOSIS — I059 Rheumatic mitral valve disease, unspecified: Secondary | ICD-10-CM | POA: Diagnosis not present

## 2021-12-09 DIAGNOSIS — G479 Sleep disorder, unspecified: Secondary | ICD-10-CM | POA: Diagnosis not present

## 2021-12-09 DIAGNOSIS — F330 Major depressive disorder, recurrent, mild: Secondary | ICD-10-CM | POA: Diagnosis not present

## 2022-01-28 ENCOUNTER — Ambulatory Visit: Payer: Medicare Other | Attending: Interventional Cardiology | Admitting: *Deleted

## 2022-01-28 DIAGNOSIS — Z5181 Encounter for therapeutic drug level monitoring: Secondary | ICD-10-CM | POA: Diagnosis not present

## 2022-01-28 LAB — POCT INR: INR: 1.6 — AB (ref 2.0–3.0)

## 2022-01-28 NOTE — Patient Instructions (Signed)
Description   Today take 2 tablets then continue taking Warfarin 1.5 tablets ('6mg'$ ) daily. Recheck INR in 4 weeks (normally 8 weeks). Coumadin Clinic [phone] or [phone]

## 2022-02-02 ENCOUNTER — Other Ambulatory Visit: Payer: Self-pay | Admitting: Family Medicine

## 2022-02-02 DIAGNOSIS — Z1231 Encounter for screening mammogram for malignant neoplasm of breast: Secondary | ICD-10-CM

## 2022-02-10 ENCOUNTER — Other Ambulatory Visit: Payer: Self-pay

## 2022-02-25 ENCOUNTER — Ambulatory Visit: Payer: Medicare Other | Attending: Internal Medicine | Admitting: *Deleted

## 2022-02-25 DIAGNOSIS — Z5181 Encounter for therapeutic drug level monitoring: Secondary | ICD-10-CM

## 2022-02-25 LAB — POCT INR: INR: 2.2 (ref 2.0–3.0)

## 2022-02-25 NOTE — Patient Instructions (Signed)
Description   Continue taking Warfarin 1.5 tablets ('6mg'$ ) daily. Recheck INR in 6 weeks (normally 8 weeks). Coumadin Clinic [phone] or [phone]

## 2022-03-22 ENCOUNTER — Telehealth: Payer: Self-pay | Admitting: Adult Health

## 2022-03-22 ENCOUNTER — Other Ambulatory Visit: Payer: Self-pay

## 2022-03-22 MED ORDER — AMLODIPINE BESYLATE 5 MG PO TABS: 5.0000 mg | ORAL_TABLET | Freq: Every day | ORAL | 3 refills | Status: DC

## 2022-03-22 NOTE — Telephone Encounter (Signed)
Left patient a vm regarding upcoming appointment change

## 2022-03-25 ENCOUNTER — Ambulatory Visit
Admission: RE | Admit: 2022-03-25 | Discharge: 2022-03-25 | Disposition: A | Discharge status: Home / Self Care | Payer: Medicare Other | Source: Ambulatory Visit | Attending: Family Medicine | Admitting: Family Medicine

## 2022-03-25 DIAGNOSIS — Z1231 Encounter for screening mammogram for malignant neoplasm of breast: Secondary | ICD-10-CM | POA: Diagnosis not present

## 2022-03-30 ENCOUNTER — Other Ambulatory Visit: Payer: Self-pay | Admitting: Family Medicine

## 2022-03-30 DIAGNOSIS — R928 Other abnormal and inconclusive findings on diagnostic imaging of breast: Secondary | ICD-10-CM

## 2022-04-08 ENCOUNTER — Ambulatory Visit: Payer: Medicare Other | Attending: Cardiology | Admitting: *Deleted

## 2022-04-08 DIAGNOSIS — I4892 Unspecified atrial flutter: Secondary | ICD-10-CM | POA: Insufficient documentation

## 2022-04-08 DIAGNOSIS — Z5181 Encounter for therapeutic drug level monitoring: Secondary | ICD-10-CM | POA: Diagnosis not present

## 2022-04-08 LAB — POCT INR: INR: 2.1 (ref 2.0–3.0)

## 2022-04-08 NOTE — Patient Instructions (Addendum)
Description   Continue taking Warfarin 1.5 tablets ('6mg'$ ) daily. Recheck INR in 7 weeks (normally 8 weeks). Coumadin Clinic [phone] or [phone]

## 2022-04-09 ENCOUNTER — Encounter: Payer: Self-pay | Admitting: Adult Health

## 2022-04-09 ENCOUNTER — Inpatient Hospital Stay: Payer: Medicare Other | Attending: Adult Health | Admitting: Adult Health

## 2022-04-09 VITALS — BP 144/66 | HR 73 | Temp 97.7°F | Resp 16 | Ht 69.0 in | Wt 138.7 lb

## 2022-04-09 DIAGNOSIS — M8588 Other specified disorders of bone density and structure, other site: Secondary | ICD-10-CM | POA: Diagnosis not present

## 2022-04-09 DIAGNOSIS — M810 Age-related osteoporosis without current pathological fracture: Secondary | ICD-10-CM | POA: Diagnosis not present

## 2022-04-09 DIAGNOSIS — C50211 Malignant neoplasm of upper-inner quadrant of right female breast: Secondary | ICD-10-CM | POA: Diagnosis not present

## 2022-04-09 DIAGNOSIS — Z79811 Long term (current) use of aromatase inhibitors: Secondary | ICD-10-CM | POA: Diagnosis not present

## 2022-04-09 DIAGNOSIS — Z170 Estrogen receptor positive status [ER+]: Secondary | ICD-10-CM | POA: Diagnosis not present

## 2022-04-09 DIAGNOSIS — Z7901 Long term (current) use of anticoagulants: Secondary | ICD-10-CM | POA: Diagnosis not present

## 2022-04-09 DIAGNOSIS — I495 Sick sinus syndrome: Secondary | ICD-10-CM | POA: Diagnosis not present

## 2022-04-09 DIAGNOSIS — Z800 Family history of malignant neoplasm of digestive organs: Secondary | ICD-10-CM | POA: Insufficient documentation

## 2022-04-09 DIAGNOSIS — E7800 Pure hypercholesterolemia, unspecified: Secondary | ICD-10-CM | POA: Diagnosis not present

## 2022-04-09 DIAGNOSIS — Z87891 Personal history of nicotine dependence: Secondary | ICD-10-CM | POA: Insufficient documentation

## 2022-04-09 DIAGNOSIS — R634 Abnormal weight loss: Secondary | ICD-10-CM | POA: Insufficient documentation

## 2022-04-09 DIAGNOSIS — I129 Hypertensive chronic kidney disease with stage 1 through stage 4 chronic kidney disease, or unspecified chronic kidney disease: Secondary | ICD-10-CM | POA: Insufficient documentation

## 2022-04-09 DIAGNOSIS — M8580 Other specified disorders of bone density and structure, unspecified site: Secondary | ICD-10-CM

## 2022-04-09 DIAGNOSIS — I4892 Unspecified atrial flutter: Secondary | ICD-10-CM | POA: Insufficient documentation

## 2022-04-09 DIAGNOSIS — Z803 Family history of malignant neoplasm of breast: Secondary | ICD-10-CM | POA: Diagnosis not present

## 2022-04-09 DIAGNOSIS — N182 Chronic kidney disease, stage 2 (mild): Secondary | ICD-10-CM | POA: Diagnosis not present

## 2022-04-09 NOTE — Assessment & Plan Note (Addendum)
Martha Edwards is an 89 year old woman with history of stage IA ER/PR positive breast cancer diagnosed in 02/2015 s/p lumpectomy and antiestrogen therapy with Anastrozole that she completed in 05/2020.    She is doing well today.  Her breast exam shows no signs of local recurrence.  She has a 20 pound weight loss, with an unknown etiology.  I recommended that she undergo CT c/a/p to r/o occult malignancy or etiology of her unintentional weight loss.    She was recommended to f/u with the breast center for diagnostic right breast mammogram next week when scheduled.    We will f/u with Ada regarding her results.  For now we will plan to see her in one year for long term survivorship, but will work her in sooner if the above tests require.

## 2022-04-09 NOTE — Progress Notes (Signed)
[facility] Follow up:    Martha Jordan, MD [address] [place] [address]   DIAGNOSIS:  Cancer Staging  Carcinoma of upper-inner quadrant of right female breast [facility]) Staging form: Breast, AJCC 7th Edition - Pathologic stage from 04/07/2015: Stage Unknown (T1c, NX, cM0) - Unsigned Laterality: Right - Clinical: Stage IA (T1c, N0, M0) - Signed by Chauncey Cruel, MD on 05/16/2015   SUMMARY OF ONCOLOGIC HISTORY: Oncology History  Carcinoma of upper-inner quadrant of right female breast ([place])  01/20/2015 Mammogram   In the right breast, a possible mass warranting further evaluation. In the left breast, no findings suspicious for malignancy   02/19/2015 Breast US   Right breast: irregularly marginated mass with increased vascularity located at 12:30 o'clock position 4 cm from the nipple measuring 1.3 x 1.3 x 1.0 cm in size   02/27/2015 Initial Biopsy   Right breast core needle bx: Invasive ductal carcinoma, grade 1-2, ER+ (100%), PR+ (90%), HER2/neu negative (ratio 1.15), Ki6710%   02/27/2015 Clinical Stage   Stage IA: T1c No   04/07/2015 Definitive Surgery   Invasive ductal carcinoma, grade 1, ER+, PR+, HER2/neu negative, Ki67 10%   04/07/2015 Pathologic Stage   Stage IA: pT1c pNx    Radiation Therapy   pt declined   05/16/2015 - 05/2020 Anti-estrogen oral therapy   Anastrozole 1 mg daily.   05/21/2015 Survivorship   Survivorship care plan completed and mailed to patient in lieu of in person visit at her request     CURRENT THERAPY: Observation  INTERVAL HISTORY: Martha Edwards 89 y.o. female returns for follow-up of her history of breast cancer.  She continues on observation alone.  Recent mammogram occurred on March 25, 2022 demonstrating left breast calcifications and a diagnostic mammogram was recommended and is scheduled for April 20, 2022.  She is doing moderately well, however she is concerned over a weight loss of about 20  pounds over the past 2 years.  She denies any new issues such as night sweats, fevers, or chills.  She denies lymphadenopathy.  She has no appetite changes and tells me that she eats very well and doesn't skip any meals.   She underwent lab work in August, 2023 with her PCP that revealed a normal TSH, and in 12/2021 that revealed normal CBC and CMP.    Patient Active Problem List   Diagnosis Date Noted   Genetic testing 09/20/2017   Family history of breast cancer    Family history of prostate cancer    Encounter for therapeutic drug monitoring 05/02/2017   Malignant neoplasm of upper-inner quadrant of right breast in female, estrogen receptor positive ([place]) 03/23/2017   Osteopenia determined by x-ray 04/20/2016   Carcinoma of upper-inner quadrant of right female breast ([place]) 05/16/2015   Tachy-brady syndrome ([employer]) 10/17/2014   Paroxysmal atrial flutter ([place]) 08/30/2013   Chronic anticoagulation 08/30/2013   Essential hypertension 08/30/2013    has No Known Allergies.  MEDICAL HISTORY: Past Medical History:  Diagnosis Date   Anticoagulated    Anxiety    Arthritis of hand    Atrial flutter ([place])    with LVEF 60%, LVH, LAE and holter with rate control and Ave HR 60 bpm    Breast cancer ([place]) 2017    RIGHT BREAST CA   Carotid artery plaque    mild bilateral carotid plaque without significant stenosis (<20% in left ICA), 04/2010   CKD (chronic kidney disease) stage 2, GFR 60-89 ml/min  Diverticula, colon    Family history of breast cancer    Family history of prostate cancer    Hypercholesterolemia    Hypertension    with LVH on echo 06/03/11   Hypertonicity of bladder    Insomnia    Mitral regurgitation    Osteopenia    > osteoporosis   Overactive bladder     SURGICAL HISTORY: Past Surgical History:  Procedure Laterality Date   BREAST BIOPSY     BREAST LUMPECTOMY Right    2017   BREAST LUMPECTOMY WITH RADIOACTIVE SEED LOCALIZATION Right 04/07/2015   Procedure: RIGHT  BREAST LUMPECTOMY WITH RADIOACTIVE SEED LOCALIZATION;  Surgeon: Excell Seltzer, MD;  Location: [place];  Service: General;  Laterality: Right;   COLONOSCOPY  2001   2007 sigmoidoscopy     SOCIAL HISTORY: Social History   Socioeconomic History   Marital status: Single    Spouse name: Not on file   Number of children: Not on file   Years of education: Not on file   Highest education level: Not on file  Occupational History   Not on file  Tobacco Use   Smoking status: Former    Types: Cigarettes    Quit date: 02/02/1984    Years since quitting: 38.2   Smokeless tobacco: Never  Substance and Sexual Activity   Alcohol use: Not on file   Drug use: Not on file   Sexual activity: Never  Other Topics Concern   Not on file  Social History Narrative   Not on file   Social Determinants of Health   Financial Resource Strain: Not on file  Food Insecurity: Not on file  Transportation Needs: Not on file  Physical Activity: Not on file  Stress: Not on file  Social Connections: Not on file  Intimate Partner Violence: Not on file    FAMILY HISTORY: Family History  Problem Relation Age of Onset   Diabetes Mother    Liver cancer Mother    Breast cancer Sister 66   Prostate cancer Brother 69       metastatic   Heart attack Neg Hx    Stroke Neg Hx     Review of Systems  Constitutional:  Positive for unexpected weight change. Negative for appetite change, chills, fatigue and fever.  HENT:   Negative for hearing loss, lump/mass and trouble swallowing.   Eyes:  Negative for eye problems and icterus.  Respiratory:  Negative for chest tightness, cough and shortness of breath.   Cardiovascular:  Negative for chest pain, leg swelling and palpitations.  Gastrointestinal:  Negative for abdominal distention, abdominal pain, constipation, diarrhea, nausea and vomiting.  Endocrine: Negative for hot flashes.  Genitourinary:  Negative for difficulty urinating.    Musculoskeletal:  Negative for arthralgias.  Skin:  Negative for itching and rash.  Neurological:  Negative for dizziness, extremity weakness, headaches and numbness.  Hematological:  Negative for adenopathy. Does not bruise/bleed easily.  Psychiatric/Behavioral:  Negative for depression. The patient is not nervous/anxious.       PHYSICAL EXAMINATION  ECOG PERFORMANCE STATUS: 1 - Symptomatic but completely ambulatory  Vitals:   04/09/22 0939  BP: (!) 144/66  Pulse: 73  Resp: 16  Temp: 97.7 F (36.5 C)  SpO2: 97%    Physical Exam Constitutional:      General: She is not in acute distress.    Appearance: Normal appearance. She is not toxic-appearing.  HENT:     Head: Normocephalic and atraumatic.  Eyes:  General: No scleral icterus. Cardiovascular:     Rate and Rhythm: Normal rate and regular rhythm.     Pulses: Normal pulses.     Heart sounds: Normal heart sounds.  Pulmonary:     Effort: Pulmonary effort is normal.     Breath sounds: Normal breath sounds.  Chest:     Comments: Right breast s/p lumpectomy, no sign of local recurrence, left breast is benign Abdominal:     General: Abdomen is flat. Bowel sounds are normal. There is no distension.     Palpations: Abdomen is soft.     Tenderness: There is no abdominal tenderness.  Musculoskeletal:        General: No swelling.     Cervical back: Neck supple.  Lymphadenopathy:     Cervical: No cervical adenopathy.  Skin:    General: Skin is warm and dry.     Findings: No rash.  Neurological:     General: No focal deficit present.     Mental Status: She is alert.  Psychiatric:        Mood and Affect: Mood normal.        Behavior: Behavior normal.     LABORATORY DATA: None for this visit    ASSESSMENT and THERAPY PLAN:   Osteopenia determined by x-ray She last received prolia in 04/2020.  In 09/2020 her bone density demonstrated a t score of -2.2.  We will repeat bone density testing in 09/2022 to evaluate  her osteopenia status in the absence of Prolia.  We discussed calcium, vitamin d and weight bearing exercises.   Malignant neoplasm of upper-inner quadrant of right breast in female, estrogen receptor positive (HCC) Sumi is an 89 year old woman with history of stage IA ER/PR positive breast cancer diagnosed in 02/2015 s/p lumpectomy and antiestrogen therapy with Anastrozole that she completed in 05/2020.    She is doing well today.  Her breast exam shows no signs of local recurrence.  She has a 20 pound weight loss, with an unknown etiology.  I recommended that she undergo CT c/a/p to r/o occult malignancy or etiology of her unintentional weight loss.    She was recommended to f/u with the breast center for diagnostic right breast mammogram next week when scheduled.    We will f/u with Shamell regarding her results.  For now we will plan to see her in one year for long term survivorship, but will work her in sooner if the above tests require.      All questions were answered. The patient knows to call the clinic with any problems, questions or concerns. We can certainly see the patient much sooner if necessary.  Total encounter time:30 minutes*in face-to-face visit time, chart review, lab review, care coordination, order entry, and documentation of the encounter time.    Wilber Bihari, NP 04/09/22 11:31 AM Medical Oncology and Hematology [facility] [place], [place] [address] Tel. [phone]    Fax. [phone]  *Total Encounter Time as defined by the Centers for Medicare and Medicaid Services includes, in addition to the face-to-face time of a patient visit (documented in the note above) non-face-to-face time: obtaining and reviewing outside history, ordering and reviewing medications, tests or procedures, care coordination (communications with other health care professionals or caregivers) and documentation in the medical record.

## 2022-04-09 NOTE — Assessment & Plan Note (Signed)
She last received prolia in 04/2020.  In 09/2020 her bone density demonstrated a t score of -2.2.  We will repeat bone density testing in 09/2022 to evaluate her osteopenia status in the absence of Prolia.  We discussed calcium, vitamin d and weight bearing exercises.

## 2022-04-13 ENCOUNTER — Telehealth: Payer: Self-pay | Admitting: Hematology and Oncology

## 2022-04-13 NOTE — Telephone Encounter (Signed)
Scheduled appointment per 3/8 los. Left voicemail.

## 2022-04-20 ENCOUNTER — Ambulatory Visit
Admission: RE | Admit: 2022-04-20 | Discharge: 2022-04-20 | Disposition: A | Discharge status: Home / Self Care | Payer: Medicare Other | Source: Ambulatory Visit | Attending: Family Medicine | Admitting: Family Medicine

## 2022-04-20 DIAGNOSIS — R928 Other abnormal and inconclusive findings on diagnostic imaging of breast: Secondary | ICD-10-CM | POA: Diagnosis not present

## 2022-04-20 DIAGNOSIS — R921 Mammographic calcification found on diagnostic imaging of breast: Secondary | ICD-10-CM | POA: Diagnosis not present

## 2022-04-22 ENCOUNTER — Other Ambulatory Visit: Payer: Self-pay | Admitting: Family Medicine

## 2022-04-22 DIAGNOSIS — R921 Mammographic calcification found on diagnostic imaging of breast: Secondary | ICD-10-CM

## 2022-04-26 ENCOUNTER — Ambulatory Visit (HOSPITAL_COMMUNITY)
Admission: RE | Admit: 2022-04-26 | Discharge: 2022-04-26 | Disposition: A | Discharge status: Home / Self Care | Payer: Medicare Other | Source: Ambulatory Visit | Attending: Adult Health | Admitting: Adult Health

## 2022-04-26 DIAGNOSIS — M8588 Other specified disorders of bone density and structure, other site: Secondary | ICD-10-CM | POA: Diagnosis not present

## 2022-04-26 DIAGNOSIS — M8580 Other specified disorders of bone density and structure, unspecified site: Secondary | ICD-10-CM | POA: Diagnosis not present

## 2022-04-26 DIAGNOSIS — Z170 Estrogen receptor positive status [ER+]: Secondary | ICD-10-CM | POA: Insufficient documentation

## 2022-04-26 DIAGNOSIS — C50211 Malignant neoplasm of upper-inner quadrant of right female breast: Secondary | ICD-10-CM | POA: Insufficient documentation

## 2022-04-26 DIAGNOSIS — J439 Emphysema, unspecified: Secondary | ICD-10-CM | POA: Diagnosis not present

## 2022-04-26 DIAGNOSIS — N289 Disorder of kidney and ureter, unspecified: Secondary | ICD-10-CM | POA: Diagnosis not present

## 2022-04-26 MED ORDER — IOHEXOL 300 MG/ML  SOLN
80.0000 mL | Freq: Once | INTRAMUSCULAR | Status: AC | PRN
  Administered 2022-04-26: 80 mL via INTRAVENOUS

## 2022-04-29 ENCOUNTER — Other Ambulatory Visit: Payer: Self-pay | Admitting: Adult Health

## 2022-04-29 DIAGNOSIS — R911 Solitary pulmonary nodule: Secondary | ICD-10-CM

## 2022-04-29 DIAGNOSIS — Z7901 Long term (current) use of anticoagulants: Secondary | ICD-10-CM

## 2022-04-29 DIAGNOSIS — Z170 Estrogen receptor positive status [ER+]: Secondary | ICD-10-CM

## 2022-04-29 MED ORDER — AZITHROMYCIN 250 MG PO TABS: ORAL_TABLET | ORAL | 0 refills | Status: DC

## 2022-04-29 NOTE — Progress Notes (Signed)
Reviewed CT scan results with [name] that demonstrate indeterminate right lower lobe nodule that could be infection.  I prescribed Zpak and let her know that I sent this into her pharmacy.  The recommendation is for her to take the antibiotics, and repeat CT scan in 3-4 weeks.  If nodule is persistent, then PET scan is recommended.   Zpak sent to her pharmacy.  Repeat CT scan ordered   I recommended she reach out to the Coumadin Clinic she goes to and let them know she was prescribed an antibiotic in case she needs to return earlier for her INR.    I repeated the above information 3 times for Martha Edwards.  I offered to call her children to review with them, but she declined.    I will ask scheduling to get her in for f/u in about 4-5 weeks so we can discuss the CT scan results in person.    Wilber Bihari, NP 04/29/22 4:34 PM Medical Oncology and Hematology [facility] [place], [place] [address] Tel. [phone]    Fax. [phone]

## 2022-05-05 ENCOUNTER — Telehealth: Payer: Self-pay | Admitting: Adult Health

## 2022-05-05 NOTE — Telephone Encounter (Signed)
Scheduled appointment per scheduling message. Patient is aware of the made appointment. 

## 2022-05-13 ENCOUNTER — Ambulatory Visit: Payer: Medicare Other | Attending: Cardiology | Admitting: *Deleted

## 2022-05-13 DIAGNOSIS — Z5181 Encounter for therapeutic drug level monitoring: Secondary | ICD-10-CM | POA: Insufficient documentation

## 2022-05-13 LAB — POCT INR
POC INR: 1.8
POC INR: 1.8

## 2022-05-13 NOTE — Patient Instructions (Signed)
Description   Take 2 tablets of warfarin today and then continue taking Warfarin 1.5 tablets (6mg ) daily. Recheck INR in 3 weeks. Coumadin Clinic [phone] or [phone]

## 2022-06-02 ENCOUNTER — Ambulatory Visit (HOSPITAL_COMMUNITY)
Admission: RE | Admit: 2022-06-02 | Discharge: 2022-06-02 | Disposition: A | Discharge status: Home / Self Care | Payer: Medicare Other | Source: Ambulatory Visit | Attending: Adult Health | Admitting: Adult Health

## 2022-06-02 DIAGNOSIS — R918 Other nonspecific abnormal finding of lung field: Secondary | ICD-10-CM | POA: Diagnosis not present

## 2022-06-02 DIAGNOSIS — K5730 Diverticulosis of large intestine without perforation or abscess without bleeding: Secondary | ICD-10-CM | POA: Diagnosis not present

## 2022-06-02 DIAGNOSIS — R911 Solitary pulmonary nodule: Secondary | ICD-10-CM | POA: Diagnosis not present

## 2022-06-02 DIAGNOSIS — C50211 Malignant neoplasm of upper-inner quadrant of right female breast: Secondary | ICD-10-CM

## 2022-06-02 DIAGNOSIS — Z170 Estrogen receptor positive status [ER+]: Secondary | ICD-10-CM

## 2022-06-02 MED ORDER — IOHEXOL 300 MG/ML  SOLN
100.0000 mL | Freq: Once | INTRAMUSCULAR | Status: AC | PRN
  Administered 2022-06-02: 100 mL via INTRAVENOUS

## 2022-06-03 ENCOUNTER — Ambulatory Visit: Payer: Medicare Other | Attending: Internal Medicine

## 2022-06-03 DIAGNOSIS — Z5181 Encounter for therapeutic drug level monitoring: Secondary | ICD-10-CM

## 2022-06-03 LAB — POCT INR: INR: 1.8 — AB (ref 2.0–3.0)

## 2022-06-03 NOTE — Patient Instructions (Signed)
Description   Take 2 tablets of warfarin today and then START taking Warfarin 1.5 tablets (6mg ) daily except 2 tablets on Sundays. Recheck INR in 3 weeks. Coumadin Clinic [phone] or [phone]

## 2022-06-07 ENCOUNTER — Inpatient Hospital Stay: Payer: Medicare Other | Attending: Adult Health | Admitting: Adult Health

## 2022-06-07 ENCOUNTER — Other Ambulatory Visit: Payer: Self-pay

## 2022-06-07 ENCOUNTER — Encounter: Payer: Self-pay | Admitting: Adult Health

## 2022-06-07 VITALS — BP 140/71 | HR 75 | Temp 97.7°F | Resp 18 | Ht 69.0 in | Wt 139.8 lb

## 2022-06-07 DIAGNOSIS — Z8042 Family history of malignant neoplasm of prostate: Secondary | ICD-10-CM | POA: Insufficient documentation

## 2022-06-07 DIAGNOSIS — R634 Abnormal weight loss: Secondary | ICD-10-CM | POA: Diagnosis not present

## 2022-06-07 DIAGNOSIS — Z79811 Long term (current) use of aromatase inhibitors: Secondary | ICD-10-CM | POA: Diagnosis not present

## 2022-06-07 DIAGNOSIS — I129 Hypertensive chronic kidney disease with stage 1 through stage 4 chronic kidney disease, or unspecified chronic kidney disease: Secondary | ICD-10-CM | POA: Diagnosis not present

## 2022-06-07 DIAGNOSIS — Z803 Family history of malignant neoplasm of breast: Secondary | ICD-10-CM | POA: Diagnosis not present

## 2022-06-07 DIAGNOSIS — I4892 Unspecified atrial flutter: Secondary | ICD-10-CM | POA: Insufficient documentation

## 2022-06-07 DIAGNOSIS — I495 Sick sinus syndrome: Secondary | ICD-10-CM | POA: Diagnosis not present

## 2022-06-07 DIAGNOSIS — Z923 Personal history of irradiation: Secondary | ICD-10-CM | POA: Insufficient documentation

## 2022-06-07 DIAGNOSIS — C50211 Malignant neoplasm of upper-inner quadrant of right female breast: Secondary | ICD-10-CM | POA: Diagnosis not present

## 2022-06-07 DIAGNOSIS — R911 Solitary pulmonary nodule: Secondary | ICD-10-CM | POA: Diagnosis not present

## 2022-06-07 DIAGNOSIS — Z87891 Personal history of nicotine dependence: Secondary | ICD-10-CM | POA: Diagnosis not present

## 2022-06-07 DIAGNOSIS — M8580 Other specified disorders of bone density and structure, unspecified site: Secondary | ICD-10-CM | POA: Diagnosis not present

## 2022-06-07 DIAGNOSIS — Z7901 Long term (current) use of anticoagulants: Secondary | ICD-10-CM | POA: Insufficient documentation

## 2022-06-07 DIAGNOSIS — Z800 Family history of malignant neoplasm of digestive organs: Secondary | ICD-10-CM | POA: Insufficient documentation

## 2022-06-07 DIAGNOSIS — E7800 Pure hypercholesterolemia, unspecified: Secondary | ICD-10-CM | POA: Insufficient documentation

## 2022-06-07 DIAGNOSIS — Z170 Estrogen receptor positive status [ER+]: Secondary | ICD-10-CM | POA: Diagnosis not present

## 2022-06-07 NOTE — Progress Notes (Signed)
[facility] Cancer Follow up:    Martha Palmer, MD [address] [place] [address]   DIAGNOSIS:  Cancer Staging  Carcinoma of upper-inner quadrant of right female breast [facility]) Staging form: Breast, AJCC 7th Edition - Pathologic stage from 04/07/2015: Stage Unknown (T1c, NX, cM0) - Unsigned Laterality: Right - Clinical: Stage IA (T1c, N0, M0) - Signed by Lowella Dell, MD on 05/16/2015   SUMMARY OF ONCOLOGIC HISTORY: Oncology History  Carcinoma of upper-inner quadrant of right female breast (HCC)  01/20/2015 Mammogram   In the right breast, a possible mass warranting further evaluation. In the left breast, no findings suspicious for malignancy   02/19/2015 Breast US   Right breast: irregularly marginated mass with increased vascularity located at 12:30 o'clock position 4 cm from the nipple measuring 1.3 x 1.3 x 1.0 cm in size   02/27/2015 Initial Biopsy   Right breast core needle bx: Invasive ductal carcinoma, grade 1-2, ER+ (100%), PR+ (90%), HER2/neu negative (ratio 1.15), Ki6710%   02/27/2015 Clinical Stage   Stage IA: T1c No   04/07/2015 Definitive Surgery   Invasive ductal carcinoma, grade 1, ER+, PR+, HER2/neu negative, Ki67 10%   04/07/2015 Pathologic Stage   Stage IA: pT1c pNx    Radiation Therapy   pt declined   05/16/2015 - 05/2020 Anti-estrogen oral therapy   Anastrozole 1 mg daily.   05/21/2015 Survivorship   Survivorship care plan completed and mailed to patient in lieu of in person visit at her request     CURRENT THERAPY: Observation  INTERVAL HISTORY: Martha Edwards 89 y.o. female returns for follow-up today.  I saw her back in March and she had complained of a 20 pound weight loss over the past 2 years that she was concerned about.  She did not seem to have any other overwhelming symptoms and lab testing was normal.    We obtained CT chest abdomen and pelvis on April 26, 2022 that identified irregular right lower lobe  consolidation along with additional groundglass nodules in the lungs bilaterally.  There was a question that the bilateral ground glass appearance could be infectious or inflammatory in nature, so she was prescribed a Z pak to take on 04/29/2022 which she completed.  Martha Edwards underwent repeat CT chest abdomen pelvis on Jun 02, 2022 that was just read this morning identifying a persistent 1.2 cm solid nodule in the anterior left upper lobe strongly favoring primary bronchogenic carcinoma over pulmonary metastasis, along with stable bilateral groundglass nodules measuring up to 1.1 cm.    Martha Edwards is here today for follow-up to review the results of her repeat CT chest abdomen pelvis.  She is feeling well today.  She is eating and drinking well.  Her weight is stable.  She is accompanied by her son Martha Edwards, who drove her to the office today.   Patient Active Problem List   Diagnosis Date Noted   Genetic testing 09/20/2017   Family history of breast cancer    Family history of prostate cancer    Encounter for therapeutic drug monitoring 05/02/2017   Malignant neoplasm of upper-inner quadrant of right breast in female, estrogen receptor positive (HCC) 03/23/2017   Osteopenia determined by x-ray 04/20/2016   Carcinoma of upper-inner quadrant of right female breast (HCC) 05/16/2015   Tachy-brady syndrome (HCC) 10/17/2014   Paroxysmal atrial flutter (HCC) 08/30/2013   Chronic anticoagulation 08/30/2013   Essential hypertension 08/30/2013    has No Known Allergies.  MEDICAL HISTORY:  Past Medical History:  Diagnosis Date   Anticoagulated    Anxiety    Arthritis of hand    Atrial flutter (HCC)    with LVEF 60%, LVH, LAE and holter with rate control and Ave HR 60 bpm    Breast cancer (HCC) 2017    RIGHT BREAST CA   Carotid artery plaque    mild bilateral carotid plaque without significant stenosis (<20% in left ICA), 04/2010   CKD (chronic kidney disease) stage 2, GFR 60-89 ml/min    Diverticula,  colon    Family history of breast cancer    Family history of prostate cancer    Hypercholesterolemia    Hypertension    with LVH on echo 06/03/11   Hypertonicity of bladder    Insomnia    Mitral regurgitation    Osteopenia    > osteoporosis   Overactive bladder     SURGICAL HISTORY: Past Surgical History:  Procedure Laterality Date   BREAST BIOPSY     BREAST LUMPECTOMY Right    2017   BREAST LUMPECTOMY WITH RADIOACTIVE SEED LOCALIZATION Right 04/07/2015   Procedure: RIGHT BREAST LUMPECTOMY WITH RADIOACTIVE SEED LOCALIZATION;  Surgeon: Glenna Fellows, MD;  Location: [facility];  Service: General;  Laterality: Right;   COLONOSCOPY  2001   2007 sigmoidoscopy     SOCIAL HISTORY: Social History   Socioeconomic History   Marital status: Single    Spouse name: Not on file   Number of children: Not on file   Years of education: Not on file   Highest education level: Not on file  Occupational History   Not on file  Tobacco Use   Smoking status: Former    Types: Cigarettes    Quit date: 02/02/1984    Years since quitting: 38.3   Smokeless tobacco: Never  Substance and Sexual Activity   Alcohol use: Not on file   Drug use: Not on file   Sexual activity: Never  Other Topics Concern   Not on file  Social History Narrative   Not on file   Social Determinants of Health   Financial Resource Strain: Not on file  Food Insecurity: Not on file  Transportation Needs: Not on file  Physical Activity: Not on file  Stress: Not on file  Social Connections: Not on file  Intimate Partner Violence: Not on file    FAMILY HISTORY: Family History  Problem Relation Age of Onset   Diabetes Mother    Liver cancer Mother    Breast cancer Sister 44   Prostate cancer Brother 44       metastatic   Heart attack Neg Hx    Stroke Neg Hx     Review of Systems  Constitutional:  Negative for appetite change, chills, fatigue, fever and unexpected weight change.  HENT:    Negative for hearing loss, lump/mass and trouble swallowing.   Eyes:  Negative for eye problems and icterus.  Respiratory:  Negative for chest tightness, cough and shortness of breath.   Cardiovascular:  Negative for chest pain, leg swelling and palpitations.  Gastrointestinal:  Negative for abdominal distention, abdominal pain, constipation, diarrhea, nausea and vomiting.  Endocrine: Negative for hot flashes.  Genitourinary:  Negative for difficulty urinating.   Musculoskeletal:  Negative for arthralgias.  Skin:  Negative for itching and rash.  Neurological:  Negative for dizziness, extremity weakness, headaches and numbness.  Hematological:  Negative for adenopathy. Does not bruise/bleed easily.  Psychiatric/Behavioral:  Negative for depression. The patient  is not nervous/anxious.       PHYSICAL EXAMINATION    Vitals:   06/07/22 1410  BP: (!) 140/71  Pulse: 75  Resp: 18  Temp: 97.7 F (36.5 C)  SpO2: 97%   Deferred physical exam today in lieu of counseling.   LABORATORY DATA: None for this visit    ASSESSMENT and THERAPY PLAN:   Carcinoma of upper-inner quadrant of right female breast (HCC) Martha Edwards is an 89 year old woman with history of stage Ia ER/PR positive invasive ductal carcinoma of the right breast diagnosed in January 2017.  She is status post lumpectomy followed by adjuvant antiestrogen therapy with anastrozole that began in April 2017 through April 2022.  History of right breast stage Ia ER/PR positive breast cancer: She continues on observation alone with annual mammograms which she will continue. Lung nodule noted on CT chest abdomen pelvis: I reviewed the scan results with her in detail.  I let her know that I am concerned she might have lung cancer.  I recommended that she follow-up with pulmonologist Dr. Tonia Brooms to have this nodule biopsied.  She is scheduled to meet with him on May 9 at 11 AM. Weight loss: It appears that her weight loss has stabilized.  She  is eating and drinking well.  It could be that her weight decrease was related to turning to more normal activities after the pandemic.  We will await biopsy results to determine next steps for Martha Edwards's appointments here at the cancer center.  I let her know that I will be in touch with Dr. Tonia Brooms.  All questions were answered. The patient knows to call the clinic with any problems, questions or concerns. We can certainly see the patient much sooner if necessary.  Total encounter time:20 minutes*in face-to-face visit time, chart review, lab review, care coordination, order entry, and documentation of the encounter time.  Lillard Anes, NP 06/07/22 3:16 PM Medical Oncology and Hematology [facility] [address] [place], [place] [address] Tel. [phone]    Fax. [phone]  *Total Encounter Time as defined by the Centers for Medicare and Medicaid Services includes, in addition to the face-to-face time of a patient visit (documented in the note above) non-face-to-face time: obtaining and reviewing outside history, ordering and reviewing medications, tests or procedures, care coordination (communications with other health care professionals or caregivers) and documentation in the medical record.

## 2022-06-07 NOTE — Assessment & Plan Note (Signed)
Martha Edwards is an 89 year old woman with history of stage Ia ER/PR positive invasive ductal carcinoma of the right breast diagnosed in January 2017.  She is status post lumpectomy followed by adjuvant antiestrogen therapy with anastrozole that began in April 2017 through April 2022.  History of right breast stage Ia ER/PR positive breast cancer: She continues on observation alone with annual mammograms which she will continue. Lung nodule noted on CT chest abdomen pelvis: I reviewed the scan results with her in detail.  I let her know that I am concerned she might have lung cancer.  I recommended that she follow-up with pulmonologist Dr. Tonia Brooms to have this nodule biopsied.  She is scheduled to meet with him on May 9 at 11 AM. Weight loss: It appears that her weight loss has stabilized.  She is eating and drinking well.  It could be that her weight decrease was related to turning to more normal activities after the pandemic.  We will await biopsy results to determine next steps for [name]'s appointments here at the cancer center.  I let her know that I will be in touch with Dr. Tonia Brooms.

## 2022-06-09 DIAGNOSIS — I4891 Unspecified atrial fibrillation: Secondary | ICD-10-CM | POA: Diagnosis not present

## 2022-06-09 DIAGNOSIS — I10 Essential (primary) hypertension: Secondary | ICD-10-CM | POA: Diagnosis not present

## 2022-06-10 ENCOUNTER — Ambulatory Visit (INDEPENDENT_AMBULATORY_CARE_PROVIDER_SITE_OTHER): Payer: Medicare Other | Admitting: Pulmonary Disease

## 2022-06-10 ENCOUNTER — Encounter: Payer: Self-pay | Admitting: Pulmonary Disease

## 2022-06-10 VITALS — BP 130/82 | HR 71 | Ht 69.0 in | Wt 140.2 lb

## 2022-06-10 DIAGNOSIS — R911 Solitary pulmonary nodule: Secondary | ICD-10-CM | POA: Diagnosis not present

## 2022-06-10 DIAGNOSIS — Z803 Family history of malignant neoplasm of breast: Secondary | ICD-10-CM

## 2022-06-10 DIAGNOSIS — Z87891 Personal history of nicotine dependence: Secondary | ICD-10-CM

## 2022-06-10 DIAGNOSIS — C50211 Malignant neoplasm of upper-inner quadrant of right female breast: Secondary | ICD-10-CM

## 2022-06-10 DIAGNOSIS — Z7901 Long term (current) use of anticoagulants: Secondary | ICD-10-CM

## 2022-06-10 NOTE — Progress Notes (Signed)
Synopsis: Referred in May 2024 for pulmonary nodule by Lillard Anes Cornett*  Subjective:   PATIENT ID: Martha Edwards GENDER: female DOB: 1932-11-14, MRN: [id number]  Chief Complaint  Patient presents with   Consult    Lung nodule.    This is a 89 year old female, past medical history of arthritis, history of right-sided breast cancer, history of hypertension, hypercholesterolemia.  Patient seen today after follow-up CT imaging completed at oncology office which showed a right-sided pulmonary nodule.  She also has some other small groundglass lesions.  But the primary lesion in the right lung is concerning for malignancy.  She was referred here for consideration of bronchoscopy and biopsy.  Case reviewed and images reviewed today in the office.  She is on Coumadin for atrial fibrillation.  This will need to be held prior to procedure.  And consideration for Coumadin bridge on low molecular weight heparin if recommended by the anticoagulation clinic.    Past Medical History:  Diagnosis Date   Anticoagulated    Anxiety    Arthritis of hand    Atrial flutter (HCC)    with LVEF 60%, LVH, LAE and holter with rate control and Ave HR 60 bpm    Breast cancer (HCC) 2017    RIGHT BREAST CA   Carotid artery plaque    mild bilateral carotid plaque without significant stenosis (<20% in left ICA), 04/2010   CKD (chronic kidney disease) stage 2, GFR 60-89 ml/min    Diverticula, colon    Family history of breast cancer    Family history of prostate cancer    Hypercholesterolemia    Hypertension    with LVH on echo 06/03/11   Hypertonicity of bladder    Insomnia    Mitral regurgitation    Osteopenia    > osteoporosis   Overactive bladder      Family History  Problem Relation Age of Onset   Diabetes Mother    Liver cancer Mother    Breast cancer Sister 53   Prostate cancer Brother 80       metastatic   Heart attack Neg Hx    Stroke Neg Hx      Past Surgical History:  Procedure  Laterality Date   BREAST BIOPSY     BREAST LUMPECTOMY Right    2017   BREAST LUMPECTOMY WITH RADIOACTIVE SEED LOCALIZATION Right 04/07/2015   Procedure: RIGHT BREAST LUMPECTOMY WITH RADIOACTIVE SEED LOCALIZATION;  Surgeon: Glenna Fellows, MD;  Location: [facility];  Service: General;  Laterality: Right;   COLONOSCOPY  2001   2007 sigmoidoscopy     Social History   Socioeconomic History   Marital status: Single    Spouse name: Not on file   Number of children: Not on file   Years of education: Not on file   Highest education level: Not on file  Occupational History   Not on file  Tobacco Use   Smoking status: Former    Types: Cigarettes    Quit date: 02/02/1984    Years since quitting: 38.3   Smokeless tobacco: Never  Substance and Sexual Activity   Alcohol use: Not on file   Drug use: Not on file   Sexual activity: Never  Other Topics Concern   Not on file  Social History Narrative   Not on file   Social Determinants of Health   Financial Resource Strain: Not on file  Food Insecurity: Not on file  Transportation Needs: Not on file  Physical  Activity: Not on file  Stress: Not on file  Social Connections: Not on file  Intimate Partner Violence: Not on file     No Known Allergies   Outpatient Medications Prior to Visit  Medication Sig Dispense Refill   acetaminophen (TYLENOL) 325 MG tablet Take 650 mg by mouth every 6 (six) hours as needed for mild pain.     amLODipine (NORVASC) 5 MG tablet Take 1 tablet (5 mg total) by mouth daily. 90 tablet 3   atorvastatin (LIPITOR) 40 MG tablet Take 40 mg by mouth daily.     CALCIUM PO Take 1 tablet by mouth daily.     Cholecalciferol (VITAMIN D3 PO) Take 1 tablet by mouth daily.     mirtazapine (REMERON) 30 MG tablet Take 60 mg by mouth at bedtime.     warfarin (COUMADIN) 4 MG tablet TAKE AS DIRECTED BY COUMADIN CLINIC **90 DAY SUPPLY 150 tablet 1   Zoster Vaccine Adjuvanted [facility]) injection Inject into the  muscle. 0.5 mL 1   Denosumab (PROLIA [place]) Take as directed once every 6 months. (Patient not taking: Reported on 04/09/2022)     No facility-administered medications prior to visit.    Review of Systems  Constitutional:  Negative for chills, fever, malaise/fatigue and weight loss.  HENT:  Negative for hearing loss, sore throat and tinnitus.   Eyes:  Negative for blurred vision and double vision.  Respiratory:  Negative for cough, hemoptysis, sputum production, shortness of breath, wheezing and stridor.   Cardiovascular:  Negative for chest pain, palpitations, orthopnea, leg swelling and PND.  Gastrointestinal:  Negative for abdominal pain, constipation, diarrhea, heartburn, nausea and vomiting.  Genitourinary:  Negative for dysuria, hematuria and urgency.  Musculoskeletal:  Negative for joint pain and myalgias.  Skin:  Negative for itching and rash.  Neurological:  Negative for dizziness, tingling, weakness and headaches.  Endo/Heme/Allergies:  Negative for environmental allergies. Does not bruise/bleed easily.  Psychiatric/Behavioral:  Negative for depression. The patient is not nervous/anxious and does not have insomnia.   All other systems reviewed and are negative.    Objective:  Physical Exam Vitals reviewed.  Constitutional:      General: She is not in acute distress.    Appearance: She is well-developed.  HENT:     Head: Normocephalic and atraumatic.  Eyes:     General: No scleral icterus.    Conjunctiva/sclera: Conjunctivae normal.     Pupils: Pupils are equal, round, and reactive to light.  Neck:     Vascular: No JVD.     Trachea: No tracheal deviation.  Cardiovascular:     Rate and Rhythm: Normal rate and regular rhythm.     Heart sounds: Normal heart sounds. No murmur heard. Pulmonary:     Effort: Pulmonary effort is normal. No tachypnea, accessory muscle usage or respiratory distress.     Breath sounds: No stridor. No wheezing, rhonchi or rales.  Abdominal:      General: There is no distension.     Palpations: Abdomen is soft.     Tenderness: There is no abdominal tenderness.  Musculoskeletal:        General: No tenderness.     Cervical back: Neck supple.  Lymphadenopathy:     Cervical: No cervical adenopathy.  Skin:    General: Skin is warm and dry.     Capillary Refill: Capillary refill takes less than 2 seconds.     Findings: No rash.  Neurological:     Mental Status: She is alert  and oriented to person, place, and time.  Psychiatric:        Behavior: Behavior normal.      Vitals:   06/10/22 1047  BP: 130/82  Pulse: 71  SpO2: 98%  Weight: 140 lb 3.2 oz (63.6 kg)  Height: 5\' 9"  (1.753 m)   98% on RA BMI Readings from Last 3 Encounters:  06/10/22 20.70 kg/m  06/07/22 20.64 kg/m  04/09/22 20.48 kg/m   Wt Readings from Last 3 Encounters:  06/10/22 140 lb 3.2 oz (63.6 kg)  06/07/22 139 lb 12.8 oz (63.4 kg)  04/09/22 138 lb 11.2 oz (62.9 kg)     CBC    Component Value Date/Time   WBC 7.7 04/08/2021 0944   WBC 7.4 04/05/2019 0951   RBC 4.77 04/08/2021 0944   HGB 13.5 04/08/2021 0944   HGB 13.1 10/21/2016 1334   HCT 42.5 04/08/2021 0944   HCT 39.6 10/21/2016 1334   PLT 226 04/08/2021 0944   PLT 203 10/21/2016 1334   MCV 89.1 04/08/2021 0944   MCV 86.0 10/21/2016 1334   MCH 28.3 04/08/2021 0944   MCHC 31.8 04/08/2021 0944   RDW 14.5 04/08/2021 0944   RDW 15.9 (H) 10/21/2016 1334   LYMPHSABS 2.4 04/08/2021 0944   LYMPHSABS 1.6 10/21/2016 1334   MONOABS 0.7 04/08/2021 0944   MONOABS 0.5 10/21/2016 1334   EOSABS 0.2 04/08/2021 0944   EOSABS 0.1 10/21/2016 1334   BASOSABS 0.0 04/08/2021 0944   BASOSABS 0.0 10/21/2016 1334     Chest Imaging:  CT chest May 2024: Right lung nodule concerning for primary malignancy. The patient's images have been independently reviewed by me.    Pulmonary Functions Testing Results:     No data to display          FeNO:   Pathology:   Echocardiogram:   Heart  Catheterization:     Assessment & Plan:     ICD-10-CM   1. Lung nodule  R91.1 Ambulatory referral to Pulmonology    Procedural/ Surgical Case Request: ROBOTIC ASSISTED NAVIGATIONAL BRONCHOSCOPY, VIDEO BRONCHOSCOPY WITH ENDOBRONCHIAL ULTRASOUND    2. Chronic anticoagulation  Z79.01     3. Carcinoma of upper-inner quadrant of right female breast, unspecified estrogen receptor status (HCC)  C50.211     4. Family history of breast cancer  Z80.3     5. Former smoker  Z87.891       Discussion:  This is a 89 year old female, past medical history of breast cancer, former smoker quit 30+ years ago.  Found to have a right-sided pulmonary nodule concerning for primary malignancy is atrial fibrillation on Coumadin.  Plan: We discussed risk-benefit alternative today in the office and consideration bronchoscopy biopsy. Patient is agreeable to proceed. For about the risk of bleeding and pneumothorax. Patient will need to stop anticoagulation prior to procedure consideration for Coumadin bridge with Lovenox if needed. Will discuss this with our anticoagulation clinic and get recommendations from them on how to proceed. Tentative bronchoscopy date will be on 06/22/2022 to have time to plan for the Coumadin needs. Patient is not diabetic and on any diabetic meds. Will have 1 week follow-up post bronchoscopy with SG, NP.    Current Outpatient Medications:    acetaminophen (TYLENOL) 325 MG tablet, Take 650 mg by mouth every 6 (six) hours as needed for mild pain., Disp: , Rfl:    amLODipine (NORVASC) 5 MG tablet, Take 1 tablet (5 mg total) by mouth daily., Disp: 90 tablet, Rfl: 3  atorvastatin (LIPITOR) 40 MG tablet, Take 40 mg by mouth daily., Disp: , Rfl:    CALCIUM PO, Take 1 tablet by mouth daily., Disp: , Rfl:    Cholecalciferol (VITAMIN D3 PO), Take 1 tablet by mouth daily., Disp: , Rfl:    mirtazapine (REMERON) 30 MG tablet, Take 60 mg by mouth at bedtime., Disp: , Rfl:    warfarin  (COUMADIN) 4 MG tablet, TAKE AS DIRECTED BY COUMADIN CLINIC **90 DAY SUPPLY, Disp: 150 tablet, Rfl: 1   Zoster Vaccine Adjuvanted (SHINGRIX) injection, Inject into the muscle., Disp: 0.5 mL, Rfl: 1   Denosumab (PROLIA [place]), Take as directed once every 6 months. (Patient not taking: Reported on 04/09/2022), Disp: , Rfl:   I spent 62 minutes dedicated to the care of this patient on the date of this encounter to include pre-visit review of records, face-to-face time with the patient discussing conditions above, post visit ordering of testing, clinical documentation with the electronic health record, making appropriate referrals as documented, and communicating necessary findings to members of the patients care team.   [name], DO [facility] 06/10/2022 11:30 AM

## 2022-06-10 NOTE — H&P (View-Only) (Signed)
 Synopsis: Referred in May 2024 for pulmonary nodule by Causey, Lindsey Cornett*  Subjective:   PATIENT ID: Martha Edwards GENDER: female DOB: 11/23/1932, MRN: [id number]  Chief Complaint  Patient presents with   Consult    Lung nodule.    This is a 89-year-old female, past medical history of arthritis, history of right-sided breast cancer, history of hypertension, hypercholesterolemia.  Patient seen today after follow-up CT imaging completed at oncology office which showed a right-sided pulmonary nodule.  She also has some other small groundglass lesions.  But the primary lesion in the right lung is concerning for malignancy.  She was referred here for consideration of bronchoscopy and biopsy.  Case reviewed and images reviewed today in the office.  She is on Coumadin for atrial fibrillation.  This will need to be held prior to procedure.  And consideration for Coumadin bridge on low molecular weight heparin if recommended by the anticoagulation clinic.    Past Medical History:  Diagnosis Date   Anticoagulated    Anxiety    Arthritis of hand    Atrial flutter (HCC)    with LVEF 60%, LVH, LAE and holter with rate control and Ave HR 60 bpm    Breast cancer (HCC) 2017    RIGHT BREAST CA   Carotid artery plaque    mild bilateral carotid plaque without significant stenosis (<20% in left ICA), 04/2010   CKD (chronic kidney disease) stage 2, GFR 60-89 ml/min    Diverticula, colon    Family history of breast cancer    Family history of prostate cancer    Hypercholesterolemia    Hypertension    with LVH on echo 06/03/11   Hypertonicity of bladder    Insomnia    Mitral regurgitation    Osteopenia    > osteoporosis   Overactive bladder      Family History  Problem Relation Age of Onset   Diabetes Mother    Liver cancer Mother    Breast cancer Sister 65   Prostate cancer Brother 70       metastatic   Heart attack Neg Hx    Stroke Neg Hx      Past Surgical History:  Procedure  Laterality Date   BREAST BIOPSY     BREAST LUMPECTOMY Right    2017   BREAST LUMPECTOMY WITH RADIOACTIVE SEED LOCALIZATION Right 04/07/2015   Procedure: RIGHT BREAST LUMPECTOMY WITH RADIOACTIVE SEED LOCALIZATION;  Surgeon: Benjamin Hoxworth, MD;  Location: [facility];  Service: General;  Laterality: Right;   COLONOSCOPY  2001   2007 sigmoidoscopy     Social History   Socioeconomic History   Marital status: Single    Spouse name: Not on file   Number of children: Not on file   Years of education: Not on file   Highest education level: Not on file  Occupational History   Not on file  Tobacco Use   Smoking status: Former    Types: Cigarettes    Quit date: 02/02/1984    Years since quitting: 38.3   Smokeless tobacco: Never  Substance and Sexual Activity   Alcohol use: Not on file   Drug use: Not on file   Sexual activity: Never  Other Topics Concern   Not on file  Social History Narrative   Not on file   Social Determinants of Health   Financial Resource Strain: Not on file  Food Insecurity: Not on file  Transportation Needs: Not on file  Physical   Activity: Not on file  Stress: Not on file  Social Connections: Not on file  Intimate Partner Violence: Not on file     No Known Allergies   Outpatient Medications Prior to Visit  Medication Sig Dispense Refill   acetaminophen (TYLENOL) 325 MG tablet Take 650 mg by mouth every 6 (six) hours as needed for mild pain.     amLODipine (NORVASC) 5 MG tablet Take 1 tablet (5 mg total) by mouth daily. 90 tablet 3   atorvastatin (LIPITOR) 40 MG tablet Take 40 mg by mouth daily.     CALCIUM PO Take 1 tablet by mouth daily.     Cholecalciferol (VITAMIN D3 PO) Take 1 tablet by mouth daily.     mirtazapine (REMERON) 30 MG tablet Take 60 mg by mouth at bedtime.     warfarin (COUMADIN) 4 MG tablet TAKE AS DIRECTED BY COUMADIN CLINIC **90 DAY SUPPLY 150 tablet 1   Zoster Vaccine Adjuvanted (SHINGRIX) injection Inject into the  muscle. 0.5 mL 1   Denosumab (PROLIA [place]) Take as directed once every 6 months. (Patient not taking: Reported on 04/09/2022)     No facility-administered medications prior to visit.    Review of Systems  Constitutional:  Negative for chills, fever, malaise/fatigue and weight loss.  HENT:  Negative for hearing loss, sore throat and tinnitus.   Eyes:  Negative for blurred vision and double vision.  Respiratory:  Negative for cough, hemoptysis, sputum production, shortness of breath, wheezing and stridor.   Cardiovascular:  Negative for chest pain, palpitations, orthopnea, leg swelling and PND.  Gastrointestinal:  Negative for abdominal pain, constipation, diarrhea, heartburn, nausea and vomiting.  Genitourinary:  Negative for dysuria, hematuria and urgency.  Musculoskeletal:  Negative for joint pain and myalgias.  Skin:  Negative for itching and rash.  Neurological:  Negative for dizziness, tingling, weakness and headaches.  Endo/Heme/Allergies:  Negative for environmental allergies. Does not bruise/bleed easily.  Psychiatric/Behavioral:  Negative for depression. The patient is not nervous/anxious and does not have insomnia.   All other systems reviewed and are negative.    Objective:  Physical Exam Vitals reviewed.  Constitutional:      General: She is not in acute distress.    Appearance: She is well-developed.  HENT:     Head: Normocephalic and atraumatic.  Eyes:     General: No scleral icterus.    Conjunctiva/sclera: Conjunctivae normal.     Pupils: Pupils are equal, round, and reactive to light.  Neck:     Vascular: No JVD.     Trachea: No tracheal deviation.  Cardiovascular:     Rate and Rhythm: Normal rate and regular rhythm.     Heart sounds: Normal heart sounds. No murmur heard. Pulmonary:     Effort: Pulmonary effort is normal. No tachypnea, accessory muscle usage or respiratory distress.     Breath sounds: No stridor. No wheezing, rhonchi or rales.  Abdominal:      General: There is no distension.     Palpations: Abdomen is soft.     Tenderness: There is no abdominal tenderness.  Musculoskeletal:        General: No tenderness.     Cervical back: Neck supple.  Lymphadenopathy:     Cervical: No cervical adenopathy.  Skin:    General: Skin is warm and dry.     Capillary Refill: Capillary refill takes less than 2 seconds.     Findings: No rash.  Neurological:     Mental Status: She is alert   and oriented to person, place, and time.  Psychiatric:        Behavior: Behavior normal.      Vitals:   06/10/22 1047  BP: 130/82  Pulse: 71  SpO2: 98%  Weight: 140 lb 3.2 oz (63.6 kg)  Height: 5' 9" (1.753 m)   98% on RA BMI Readings from Last 3 Encounters:  06/10/22 20.70 kg/m  06/07/22 20.64 kg/m  04/09/22 20.48 kg/m   Wt Readings from Last 3 Encounters:  06/10/22 140 lb 3.2 oz (63.6 kg)  06/07/22 139 lb 12.8 oz (63.4 kg)  04/09/22 138 lb 11.2 oz (62.9 kg)     CBC    Component Value Date/Time   WBC 7.7 04/08/2021 0944   WBC 7.4 04/05/2019 0951   RBC 4.77 04/08/2021 0944   HGB 13.5 04/08/2021 0944   HGB 13.1 10/21/2016 1334   HCT 42.5 04/08/2021 0944   HCT 39.6 10/21/2016 1334   PLT 226 04/08/2021 0944   PLT 203 10/21/2016 1334   MCV 89.1 04/08/2021 0944   MCV 86.0 10/21/2016 1334   MCH 28.3 04/08/2021 0944   MCHC 31.8 04/08/2021 0944   RDW 14.5 04/08/2021 0944   RDW 15.9 (H) 10/21/2016 1334   LYMPHSABS 2.4 04/08/2021 0944   LYMPHSABS 1.6 10/21/2016 1334   MONOABS 0.7 04/08/2021 0944   MONOABS 0.5 10/21/2016 1334   EOSABS 0.2 04/08/2021 0944   EOSABS 0.1 10/21/2016 1334   BASOSABS 0.0 04/08/2021 0944   BASOSABS 0.0 10/21/2016 1334     Chest Imaging:  CT chest May 2024: Right lung nodule concerning for primary malignancy. The patient's images have been independently reviewed by me.    Pulmonary Functions Testing Results:     No data to display          FeNO:   Pathology:   Echocardiogram:   Heart  Catheterization:     Assessment & Plan:     ICD-10-CM   1. Lung nodule  R91.1 Ambulatory referral to Pulmonology    Procedural/ Surgical Case Request: ROBOTIC ASSISTED NAVIGATIONAL BRONCHOSCOPY, VIDEO BRONCHOSCOPY WITH ENDOBRONCHIAL ULTRASOUND    2. Chronic anticoagulation  Z79.01     3. Carcinoma of upper-inner quadrant of right female breast, unspecified estrogen receptor status (HCC)  C50.211     4. Family history of breast cancer  Z80.3     5. Former smoker  Z87.891       Discussion:  This is a 89-year-old female, past medical history of breast cancer, former smoker quit 30+ years ago.  Found to have a right-sided pulmonary nodule concerning for primary malignancy is atrial fibrillation on Coumadin.  Plan: We discussed risk-benefit alternative today in the office and consideration bronchoscopy biopsy. Patient is agreeable to proceed. For about the risk of bleeding and pneumothorax. Patient will need to stop anticoagulation prior to procedure consideration for Coumadin bridge with Lovenox if needed. Will discuss this with our anticoagulation clinic and get recommendations from them on how to proceed. Tentative bronchoscopy date will be on 06/22/2022 to have time to plan for the Coumadin needs. Patient is not diabetic and on any diabetic meds. Will have 1 week follow-up post bronchoscopy with SG, NP.    Current Outpatient Medications:    acetaminophen (TYLENOL) 325 MG tablet, Take 650 mg by mouth every 6 (six) hours as needed for mild pain., Disp: , Rfl:    amLODipine (NORVASC) 5 MG tablet, Take 1 tablet (5 mg total) by mouth daily., Disp: 90 tablet, Rfl: 3     atorvastatin (LIPITOR) 40 MG tablet, Take 40 mg by mouth daily., Disp: , Rfl:    CALCIUM PO, Take 1 tablet by mouth daily., Disp: , Rfl:    Cholecalciferol (VITAMIN D3 PO), Take 1 tablet by mouth daily., Disp: , Rfl:    mirtazapine (REMERON) 30 MG tablet, Take 60 mg by mouth at bedtime., Disp: , Rfl:    warfarin  (COUMADIN) 4 MG tablet, TAKE AS DIRECTED BY COUMADIN CLINIC **90 DAY SUPPLY, Disp: 150 tablet, Rfl: 1   Zoster Vaccine Adjuvanted (SHINGRIX) injection, Inject into the muscle., Disp: 0.5 mL, Rfl: 1   Denosumab (PROLIA [place]), Take as directed once every 6 months. (Patient not taking: Reported on 04/09/2022), Disp: , Rfl:   I spent 62 minutes dedicated to the care of this patient on the date of this encounter to include pre-visit review of records, face-to-face time with the patient discussing conditions above, post visit ordering of testing, clinical documentation with the electronic health record, making appropriate referrals as documented, and communicating necessary findings to members of the patients care team.   Decarlos Empey L Arvie Villarruel, DO [facility] 06/10/2022 11:30 AM    

## 2022-06-10 NOTE — Patient Instructions (Signed)
Thank you for visiting Dr. Tonia Brooms at [facility]. Today we recommend the following: Orders Placed This Encounter  Procedures   Procedural/ Surgical Case Request: ROBOTIC ASSISTED NAVIGATIONAL BRONCHOSCOPY, VIDEO BRONCHOSCOPY WITH ENDOBRONCHIAL ULTRASOUND   Ambulatory referral to Pulmonology   With bronchoscopy on 06/22/2022  Return in about 19 days (around 06/29/2022) for w/ Kandice Robinsons, NP , after Bronchoscopy.    Please do your part to reduce the spread of COVID-19.

## 2022-06-11 ENCOUNTER — Telehealth: Payer: Self-pay | Admitting: *Deleted

## 2022-06-11 NOTE — Telephone Encounter (Signed)
   Name: Martha Edwards  DOB: [birth year]  MRN: [id number]  Primary Cardiologist: None  Chart reviewed as part of pre-operative protocol coverage. Because of Martha Edwards's past medical history and time since last visit, she will require a follow-up telephone visit in order to better assess preoperative cardiovascular risk.  Pre-op covering staff: - Please schedule appointment and call patient to inform them. If patient already had an upcoming appointment within acceptable timeframe, please add "pre-op clearance" to the appointment notes so provider is aware. - Please contact requesting surgeon's office via preferred method (i.e, phone, fax) to inform them of need for appointment prior to surgery.  Per office protocol, patient can hold warfarin for 5 days prior to procedure.     Patient will NOT need bridging with Lovenox (enoxaparin) around procedure.  Sharlene Dory, PA-C  06/11/2022, 12:30 PM

## 2022-06-11 NOTE — Telephone Encounter (Signed)
Pt has been scheduled for a tele visit, 06/14/22 11;20.  Consent on file / medications reconciled.    Patient Consent for Virtual Visit        Martha Edwards has provided verbal consent on 06/11/2022 for a virtual visit (video or telephone).   CONSENT FOR VIRTUAL VISIT FOR:  Martha Edwards  By participating in this virtual visit I agree to the following:  I hereby voluntarily request, consent and authorize [facility] and its employed or contracted physicians, physician assistants, nurse practitioners or other licensed health care professionals (the Practitioner), to provide me with telemedicine health care services (the "Services") as deemed necessary by the treating Practitioner. I acknowledge and consent to receive the Services by the Practitioner via telemedicine. I understand that the telemedicine visit will involve communicating with the Practitioner through live audiovisual communication technology and the disclosure of certain medical information by electronic transmission. I acknowledge that I have been given the opportunity to request an in-person assessment or other available alternative prior to the telemedicine visit and am voluntarily participating in the telemedicine visit.  I understand that I have the right to withhold or withdraw my consent to the use of telemedicine in the course of my care at any time, without affecting my right to future care or treatment, and that the Practitioner or I may terminate the telemedicine visit at any time. I understand that I have the right to inspect all information obtained and/or recorded in the course of the telemedicine visit and may receive copies of available information for a reasonable fee.  I understand that some of the potential risks of receiving the Services via telemedicine include:  Delay or interruption in medical evaluation due to technological equipment failure or disruption; Information transmitted may not be sufficient  (e.g. poor resolution of images) to allow for appropriate medical decision making by the Practitioner; and/or  In rare instances, security protocols could fail, causing a breach of personal health information.  Furthermore, I acknowledge that it is my responsibility to provide information about my medical history, conditions and care that is complete and accurate to the best of my ability. I acknowledge that Practitioner's advice, recommendations, and/or decision may be based on factors not within their control, such as incomplete or inaccurate data provided by me or distortions of diagnostic images or specimens that may result from electronic transmissions. I understand that the practice of medicine is not an exact science and that Practitioner makes no warranties or guarantees regarding treatment outcomes. I acknowledge that a copy of this consent can be made available to me via my patient portal [facility] MyChart), or I can request a printed copy by calling the office of [facility].    I understand that my insurance will be billed for this visit.   I have read or had this consent read to me. I understand the contents of this consent, which adequately explains the benefits and risks of the Services being provided via telemedicine.  I have been provided ample opportunity to ask questions regarding this consent and the Services and have had my questions answered to my satisfaction. I give my informed consent for the services to be provided through the use of telemedicine in my medical care

## 2022-06-11 NOTE — Telephone Encounter (Signed)
Pt has been scheduled for a tele visit, 06/14/22 11;20.  Consent on file / medications reconciled.

## 2022-06-11 NOTE — Telephone Encounter (Signed)
   Pre-operative Risk Assessment    Patient Name: Martha Edwards  DOB: May 25, 1932 MRN: [id number]      Request for Surgical Clearance    Procedure:   Navigational Bronchoscopy  Date of Surgery:  Clearance 06/22/22                                 Surgeon:  Dr. Tonia Brooms Surgeon's Group or Practice Name:  [facility] Phone number:  [phone] Fax number:  [phone]   Type of Clearance Requested:   - Medical  - Pharmacy:  Hold Warfarin (Coumadin) Not Indicated   Type of Anesthesia:  Not Indicated   Additional requests/questions:    Signed, Emmit Pomfret   06/11/2022, 7:55 AM

## 2022-06-11 NOTE — Telephone Encounter (Signed)
Patient with diagnosis of afib on warfarin for anticoagulation.    Procedure: Navigational Bronchoscopy  Date of procedure: 06/22/22   CHA2DS2-VASc Score = 5   This indicates a 7.2% annual risk of stroke. The patient's score is based upon: CHF History: 0 HTN History: 1 Diabetes History: 0 Stroke History: 0 Vascular Disease History: 1 Age Score: 2 Gender Score: 1      Per office protocol, patient can hold warfarin for 5 days prior to procedure.    Patient will NOT need bridging with Lovenox (enoxaparin) around procedure.  **This guidance is not considered finalized until pre-operative APP has relayed final recommendations.**

## 2022-06-14 ENCOUNTER — Ambulatory Visit: Payer: Medicare Other | Attending: Internal Medicine

## 2022-06-14 ENCOUNTER — Other Ambulatory Visit: Payer: Self-pay | Admitting: *Deleted

## 2022-06-14 DIAGNOSIS — Z01810 Encounter for preprocedural cardiovascular examination: Secondary | ICD-10-CM

## 2022-06-14 DIAGNOSIS — I480 Paroxysmal atrial fibrillation: Secondary | ICD-10-CM

## 2022-06-14 MED ORDER — WARFARIN SODIUM 4 MG PO TABS: ORAL_TABLET | ORAL | 1 refills | Status: DC

## 2022-06-14 NOTE — Progress Notes (Signed)
Virtual Visit via Telephone Note   Because of Martha Edwards's co-morbid illnesses, she is at least at moderate risk for complications without adequate follow up.  This format is felt to be most appropriate for this patient at this time.  The patient did not have access to video technology/had technical difficulties with video requiring transitioning to audio format only (telephone).  All issues noted in this document were discussed and addressed.  No physical exam could be performed with this format.  Please refer to the patient's chart for her consent to telehealth for [facility].  Evaluation Performed:  Preoperative cardiovascular risk assessment _____________   Date:  06/14/2022   Patient ID:  Martha Edwards, DOB Apr 21, 1932, MRN [id number] Patient Location:  Home Provider location:   Office  Primary Care Provider:  [name], MD Primary Cardiologist:  None  Chief Complaint / Patient Profile   89 y.o. y/o female with a h/o tachybradycardia syndrome, PAF, hypertension, aortic valve disease with stenosis and regurgitation, and chronic anticoagulation therapy who is pending robotic assisted navigational bronchoscopy and presents today for telephonic preoperative cardiovascular risk assessment.  History of Present Illness    Martha Edwards is a 89 y.o. female who presents via Web designer for a telehealth visit today.  Pt was last seen in cardiology clinic on 12/07/2021 by Dr. Katrinka Blazing.  At that time [facility] doing well.  The patient is now pending procedure as outlined above. Since her last visit, she tells me that she has not had any issues with her heart.  No chest pain or shortness of breath.  She tells me that [facility] but lately she has been walking around her yard for exercise.  She does meet 4 METS on her DASI.   Per office protocol, patient can hold warfarin for 5 days prior to procedure.     Patient will  NOT need bridging with Lovenox (enoxaparin) around procedure.   Past Medical History    Past Medical History:  Diagnosis Date   Anticoagulated    Anxiety    Arthritis of hand    Atrial flutter (HCC)    with LVEF 60%, LVH, LAE and holter with rate control and Ave HR 60 bpm    Breast cancer (HCC) 2017    RIGHT BREAST CA   Carotid artery plaque    mild bilateral carotid plaque without significant stenosis (<20% in left ICA), 04/2010   CKD (chronic kidney disease) stage 2, GFR 60-89 ml/min    Diverticula, colon    Family history of breast cancer    Family history of prostate cancer    Hypercholesterolemia    Hypertension    with LVH on echo 06/03/11   Hypertonicity of bladder    Insomnia    Mitral regurgitation    Osteopenia    > osteoporosis   Overactive bladder    Past Surgical History:  Procedure Laterality Date   BREAST BIOPSY     BREAST LUMPECTOMY Right    2017   BREAST LUMPECTOMY WITH RADIOACTIVE SEED LOCALIZATION Right 04/07/2015   Procedure: RIGHT BREAST LUMPECTOMY WITH RADIOACTIVE SEED LOCALIZATION;  Surgeon: Glenna Fellows, MD;  Location: [facility];  Service: General;  Laterality: Right;   COLONOSCOPY  2001   2007 sigmoidoscopy     Allergies  No Known Allergies  Home Medications    Prior to Admission medications   Medication Sig Start Date End Date Taking?  Authorizing Provider  acetaminophen (TYLENOL) 325 MG tablet Take 650 mg by mouth every 6 (six) hours as needed for mild pain.    [provider]  amLODipine (NORVASC) 5 MG tablet Take 1 tablet (5 mg total) by mouth daily. 03/22/22   Meriam Sprague, MD  atorvastatin (LIPITOR) 40 MG tablet Take 40 mg by mouth daily. 11/19/17   [provider]  CALCIUM PO Take 1 tablet by mouth daily.    [provider]  Cholecalciferol (VITAMIN D3 PO) Take 1 tablet by mouth daily.    [provider]  Denosumab (PROLIA [place]) Take as directed once every 6 months.     [provider]  mirtazapine (REMERON) 30 MG tablet Take 60 mg by mouth at bedtime. 03/10/21   [provider]  warfarin (COUMADIN) 4 MG tablet TAKE AS DIRECTED BY COUMADIN CLINIC **90 DAY SUPPLY 06/14/22   Nahser, Deloris Ping, MD  Zoster Vaccine Adjuvanted [facility]) injection Inject into the muscle. 04/08/21   Judyann Munson, MD    Physical Exam    Vital Signs:  Martha Edwards does not have vital signs available for review today.  Given telephonic nature of communication, physical exam is limited. AAOx3. NAD. Normal affect.  Speech and respirations are unlabored.  Accessory Clinical Findings    None  Assessment & Plan    1.  Preoperative Cardiovascular Risk Assessment:  Martha Edwards's perioperative risk of a major cardiac event is 0.4% according to the Revised Cardiac Risk Index (RCRI).  Therefore, she is at low risk for perioperative complications.   Her functional capacity is good at 5.62 METs according to the [place] Activity Status Index (DASI). Recommendations: According to ACC/AHA guidelines, no further cardiovascular testing needed.  The patient may proceed to surgery at acceptable risk.   Antiplatelet and/or Anticoagulation Recommendations:  Coumadin can by held for 5 days prior to surgery.  Please resume post op when felt to be safe.   The patient was advised that if she develops new symptoms prior to surgery to contact our office to arrange for a follow-up visit, and she verbalized understanding.   A copy of this note will be routed to requesting surgeon.  Time:   Today, I have spent 10 minutes with the patient with telehealth technology discussing medical history, symptoms, and management plan.     Sharlene Dory, PA-C  06/14/2022, 11:33 AM

## 2022-06-14 NOTE — Telephone Encounter (Signed)
Warfarin 4mg  refill Afib Last INR 06/03/22 Last OV 12/07/21

## 2022-06-18 ENCOUNTER — Other Ambulatory Visit: Payer: Self-pay

## 2022-06-18 ENCOUNTER — Other Ambulatory Visit: Payer: Medicare Other

## 2022-06-18 DIAGNOSIS — Z01818 Encounter for other preprocedural examination: Secondary | ICD-10-CM

## 2022-06-20 LAB — NOVEL CORONAVIRUS, NAA: SARS-CoV-2, NAA: NOT DETECTED

## 2022-06-21 ENCOUNTER — Encounter (HOSPITAL_COMMUNITY): Payer: Self-pay | Admitting: Pulmonary Disease

## 2022-06-21 ENCOUNTER — Other Ambulatory Visit: Payer: Self-pay

## 2022-06-21 NOTE — Anesthesia Preprocedure Evaluation (Signed)
Anesthesia Evaluation  Patient identified by MRN, date of birth, ID band Patient awake    Reviewed: Allergy & Precautions, NPO status , Patient's Chart, lab work & pertinent test results  History of Anesthesia Complications Negative for: history of anesthetic complications  Airway Mallampati: I  TM Distance: >3 FB Neck ROM: Full    Dental  (+) Edentulous Upper, Dental Advisory Given   Pulmonary former smoker   Pulmonary exam normal        Cardiovascular hypertension, Pt. on medications + dysrhythmias Atrial Fibrillation + Valvular Problems/Murmurs AS and MR  Rhythm:Regular Rate:Normal + Systolic murmurs IMPRESSIONS     1. Left ventricular ejection fraction, by estimation, is 60 to 65%. The  left ventricle has normal function. The left ventricle has no regional  wall motion abnormalities. There is mild left ventricular hypertrophy.  Left ventricular diastolic parameters  are consistent with Grade II diastolic dysfunction (pseudonormalization).  Elevated left atrial pressure. The average left ventricular global  longitudinal strain is -26.2 %.   2. Right ventricular systolic function is normal. The right ventricular  size is normal. There is mildly elevated pulmonary artery systolic  pressure. The estimated right ventricular systolic pressure is 42.4 mmHg.   3. Left atrial size was mildly dilated.   4. The mitral valve is abnormal. Thickened leaflets. Mild mitral valve  regurgitation. Mild mitral stenosis. The mean mitral valve gradient is 4.0  mmHg with average heart rate of 50 bpm.   5. Tricuspid valve regurgitation is mild to moderate.   6. The inferior vena cava is normal in size with greater than 50%  respiratory variability, suggesting right atrial pressure of 3 mmHg.   7. The aortic valve is calcified. There is severe calcification of the  aortic valve. Aortic valve regurgitation is mild. Moderate to severe  aortic  valve stenosis. Vmax 3.0, MG 21 mmHg, AVA 0.7 cm^2, DI 0.27. Low SV  index (30 cc/m^2), suspect paradoxical   low flow low gradient severe AS     Neuro/Psych negative neurological ROS     GI/Hepatic negative GI ROS, Neg liver ROS,,,  Endo/Other  negative endocrine ROS    Renal/GU negative Renal ROS     Musculoskeletal negative musculoskeletal ROS (+)    Abdominal   Peds  Hematology negative hematology ROS (+)   Anesthesia Other Findings   Reproductive/Obstetrics                             Anesthesia Physical Anesthesia Plan  ASA: 3  Anesthesia Plan: General   Post-op Pain Management: Tylenol PO (pre-op)* and Minimal or no pain anticipated   Induction: Intravenous  PONV Risk Score and Plan: 3 and Ondansetron  Airway Management Planned: Oral ETT  Additional Equipment:   Intra-op Plan:   Post-operative Plan: Extubation in OR  Informed Consent: I have reviewed the patients History and Physical, chart, labs and discussed the procedure including the risks, benefits and alternatives for the proposed anesthesia with the patient or authorized representative who has indicated his/her understanding and acceptance.     Dental advisory given  Plan Discussed with: Anesthesiologist and CRNA  Anesthesia Plan Comments: (PAT note by Antionette Poles, PA-C: Follows with cardiology at [facility] for history of tachybradycardia syndrome, paroxysmal A-fib on Coumadin, HTN, moderate to severe aortic stenosis (mean gradient 21 mmHg by echo 11/2021 with suspected paradoxical low-flow low gradient severe AS).  Last seen by Dr. Verdis Prime 12/07/2021.  At that time  indications for TAVR were discussed.  However, she was not having significant symptoms related to aortic stenosis and recommended to continue current management and follow-up in 6 months.  Patient seen by Jari Favre, PA-C 06/14/2022 for preop evaluation.  Per note, "Since her last visit, she tells me that she  has not had any issues with her heart.  No chest pain or shortness of breath.  She tells me that [facility] but lately she has been walking around her yard for exercise.  She does meet 4 METS on her DASI.Marland KitchenMarland KitchenPreoperative Cardiovascular Risk Assessment: Ms. Santillanes's perioperative risk of a major cardiac event is 0.4% according to the Revised Cardiac Risk Index (RCRI).  Therefore, she is at low risk for perioperative complications.   Her functional capacity is good at 5.62 METs according to the Duke Activity Status Index (DASI). Recommendations: According to ACC/AHA guidelines, no further cardiovascular testing needed.  The patient may proceed to surgery at acceptable risk.  Antiplatelet and/or Anticoagulation Recommendations: Coumadin can by held for 5 days prior to surgery.  Please resume post op when felt to be safe."  Hx of R breast cancer s/p lumpectomy 2017.  Former smoker, quit 30+ years ago.  Recently found to have a right-sided pulmonary nodule concerning for primary malignancy.  Seen by pulmonologist Dr. Tonia Brooms on 06/10/2022 and bronchoscopy was recommended.  Pt will need DOS labs and eval.  EGK 12/07/21: Sinus rhythm with 1st degree AV block. Rate 60. PACs. Nonspecific ST-T wave changes.   CT Chest 06/02/22: IMPRESSION: Persistent 12 mm spiculated solid nodule in anterior left upper lobe, which strongly favors primary bronchogenic carcinoma over pulmonary metastasis.  Stable bilateral ground-glass nodules measuring up to 11 mm. Low-grade adenocarcinoma cannot be excluded. Non-contrast chest CT at 3-6 months is recommended. If nodules persist, subsequent management will be based upon the most suspicious nodule(s). This recommendation follows the consensus statement: Guidelines for Management of Incidental Pulmonary Nodules Detected on CT Images: From the Fleischner Society 2017; Radiology 2017; 284:228-243.  No definite evidence of metastatic disease within  the chest, abdomen, or pelvis.  Stable 8 mm subcapsular lesion in lower pole of left kidney, which is difficult to characterize due to its small size and could represent a hemorrhagic cyst or solid renal neoplasm. Recommend continued attention on follow-up CT given its small size.  Colonic diverticulosis, without radiographic evidence of diverticulitis.  Aortic Atherosclerosis (ICD10-I70.0) and Emphysema (ICD10-J43.9).  TTE 11/23/2021: 1. Left ventricular ejection fraction, by estimation, is 60 to 65%. The  left ventricle has normal function. The left ventricle has no regional  wall motion abnormalities. There is mild left ventricular hypertrophy.  Left ventricular diastolic parameters  are consistent with Grade II diastolic dysfunction (pseudonormalization).  Elevated left atrial pressure. The average left ventricular global  longitudinal strain is -26.2 %.  2. Right ventricular systolic function is normal. The right ventricular  size is normal. There is mildly elevated pulmonary artery systolic  pressure. The estimated right ventricular systolic pressure is 42.4 mmHg.  3. Left atrial size was mildly dilated.  4. The mitral valve is abnormal. Thickened leaflets. Mild mitral valve  regurgitation. Mild mitral stenosis. The mean mitral valve gradient is 4.0  mmHg with average heart rate of 50 bpm.  5. Tricuspid valve regurgitation is mild to moderate.  6. The inferior vena cava is normal in size with greater than 50%  respiratory variability, suggesting right atrial pressure of 3 mmHg.  7. The aortic valve  is calcified. There is severe calcification of the  aortic valve. Aortic valve regurgitation is mild. Moderate to severe  aortic valve stenosis. Vmax 3.0, MG 21 mmHg, AVA 0.7 cm^2, DI 0.27. Low SV  index (30 cc/m^2), suspect paradoxicallow flow low gradient severe AS   )        Anesthesia Quick Evaluation

## 2022-06-21 NOTE — Pre-Procedure Instructions (Signed)
PCP - [name], MD Cardiologist - Laqueta Carina, MD  Chest x-ray - DOS EKG - 12/07/21 ECHO - 10/23  Blood Thinner Instructions: Last dose of Coumadin 06/16/22  COVID TEST- N on 06/18/22  Anesthesia review: Y  Patient verbally denies any shortness of breath, fever, cough and chest pain during phone call   -------------  SDW INSTRUCTIONS given:  Your procedure is scheduled on Tues, May 21st.  Report to [facility] Main Entrance "A" at 0630 A.M., and check in at the Admitting office.  Call this number if you have problems the morning of surgery:  [phone]   Remember:  Do not eat or drink after midnight the night before your surgery    Take these medicines the morning of surgery with A SIP OF WATER  acetaminophen (TYLENOL)-if needed   As of today, STOP taking any Aspirin (unless otherwise instructed by your surgeon) Aleve, Naproxen, Ibuprofen, Motrin, Advil, Goody's, BC's, all herbal medications, fish oil, and all vitamins.                      Do not wear jewelry, make up, or nail polish            Do not wear lotions, powders, perfumes/colognes, or deodorant.            Do not shave 48 hours prior to surgery.  Men may shave face and neck.            Do not bring valuables to the hospital.            [facility] is not responsible for any belongings or valuables.  Do NOT Smoke (Tobacco/Vaping) 24 hours prior to your procedure If you use a CPAP at night, you may bring all equipment for your overnight stay.   Contacts, glasses, dentures or bridgework may not be worn into surgery.      For patients admitted to the hospital, discharge time will be determined by your treatment team.   Patients discharged the day of surgery will not be allowed to drive home, and someone needs to stay with them for 24 hours.    Special instructions:   [facility]- Preparing For Surgery  Before surgery, you can play an important role. Because skin is not sterile, your skin needs to  be as free of germs as possible. You can reduce the number of germs on your skin by washing with CHG (chlorahexidine gluconate) Soap before surgery.  CHG is an antiseptic cleaner which kills germs and bonds with the skin to continue killing germs even after washing.    Oral Hygiene is also important to reduce your risk of infection.  Remember - BRUSH YOUR TEETH THE MORNING OF SURGERY WITH YOUR REGULAR TOOTHPASTE  Please do not use if you have an allergy to CHG or antibacterial soaps. If your skin becomes reddened/irritated stop using the CHG.  Do not shave (including legs and underarms) for at least 48 hours prior to first CHG shower. It is OK to shave your face.  Please follow these instructions carefully.   Shower the NIGHT BEFORE SURGERY and the MORNING OF SURGERY with DIAL Soap.   Pat yourself dry with a CLEAN TOWEL.  Wear CLEAN PAJAMAS to bed the night before surgery  Place CLEAN SHEETS on your bed the night of your first shower and DO NOT SLEEP WITH PETS.   Day of Surgery: Please shower morning of surgery  Wear Clean/Comfortable clothing the morning of surgery  Do not apply any deodorants/lotions.   Remember to brush your teeth WITH YOUR REGULAR TOOTHPASTE.   Questions were answered. Patient verbalized understanding of instructions.

## 2022-06-21 NOTE — Progress Notes (Signed)
Anesthesia Chart Review: Same day workup  Follows with cardiology at [facility] for history of tachybradycardia syndrome, paroxysmal A-fib on Coumadin, HTN, moderate to severe aortic stenosis (mean gradient 21 mmHg by echo 11/2021 with suspected paradoxical low-flow low gradient severe AS).  Last seen by Dr. Verdis Prime 12/07/2021.  At that time indications for TAVR were discussed.  However, she was not having significant symptoms related to aortic stenosis and recommended to continue current management and follow-up in 6 months.  Patient seen by Jari Favre, PA-C 06/14/2022 for preop evaluation.  Per note, "Since her last visit, she tells me that she has not had any issues with her heart.  No chest pain or shortness of breath.  She tells me that [facility] but lately she has been walking around her yard for exercise.  She does meet 4 METS on her DASI.Marland KitchenMarland KitchenPreoperative Cardiovascular Risk Assessment: Ms. Hensarling's perioperative risk of a major cardiac event is 0.4% according to the Revised Cardiac Risk Index (RCRI).  Therefore, she is at low risk for perioperative complications.   Her functional capacity is good at 5.62 METs according to the Duke Activity Status Index (DASI). Recommendations: According to ACC/AHA guidelines, no further cardiovascular testing needed.  The patient may proceed to surgery at acceptable risk.  Antiplatelet and/or Anticoagulation Recommendations: Coumadin can by held for 5 days prior to surgery.  Please resume post op when felt to be safe."  Hx of R breast cancer s/p lumpectomy 2017.  Former smoker, quit 30+ years ago.  Recently found to have a right-sided pulmonary nodule concerning for primary malignancy.  Seen by pulmonologist Dr. Tonia Brooms on 06/10/2022 and bronchoscopy was recommended.  Pt will need DOS labs and eval.  EGK 12/07/21: Sinus rhythm with 1st degree AV block. Rate 60. PACs. Nonspecific ST-T wave changes.   CT Chest  06/02/22: IMPRESSION: Persistent 12 mm spiculated solid nodule in anterior left upper lobe, which strongly favors primary bronchogenic carcinoma over pulmonary metastasis.   Stable bilateral ground-glass nodules measuring up to 11 mm. Low-grade adenocarcinoma cannot be excluded. Non-contrast chest CT at 3-6 months is recommended. If nodules persist, subsequent management will be based upon the most suspicious nodule(s). This recommendation follows the consensus statement: Guidelines for Management of Incidental Pulmonary Nodules Detected on CT Images: From the Fleischner Society 2017; Radiology 2017; 284:228-243.   No definite evidence of metastatic disease within the chest, abdomen, or pelvis.   Stable 8 mm subcapsular lesion in lower pole of left kidney, which is difficult to characterize due to its small size and could represent a hemorrhagic cyst or solid renal neoplasm. Recommend continued attention on follow-up CT given its small size.   Colonic diverticulosis, without radiographic evidence of diverticulitis.   Aortic Atherosclerosis (ICD10-I70.0) and Emphysema (ICD10-J43.9).  TTE 11/23/2021:  1. Left ventricular ejection fraction, by estimation, is 60 to 65%. The  left ventricle has normal function. The left ventricle has no regional  wall motion abnormalities. There is mild left ventricular hypertrophy.  Left ventricular diastolic parameters  are consistent with Grade II diastolic dysfunction (pseudonormalization).  Elevated left atrial pressure. The average left ventricular global  longitudinal strain is -26.2 %.   2. Right ventricular systolic function is normal. The right ventricular  size is normal. There is mildly elevated pulmonary artery systolic  pressure. The estimated right ventricular systolic pressure is 42.4 mmHg.   3. Left atrial size was mildly dilated.   4. The mitral valve is abnormal. Thickened leaflets.  Mild mitral valve  regurgitation. Mild mitral  stenosis. The mean mitral valve gradient is 4.0  mmHg with average heart rate of 50 bpm.   5. Tricuspid valve regurgitation is mild to moderate.   6. The inferior vena cava is normal in size with greater than 50%  respiratory variability, suggesting right atrial pressure of 3 mmHg.   7. The aortic valve is calcified. There is severe calcification of the  aortic valve. Aortic valve regurgitation is mild. Moderate to severe  aortic valve stenosis. Vmax 3.0, MG 21 mmHg, AVA 0.7 cm^2, DI 0.27. Low SV  index (30 cc/m^2), suspect paradoxical low flow low gradient severe AS     Zannie Cove [facility] Short Stay Center/Anesthesiology Phone [phone] 06/21/2022 10:17 AM

## 2022-06-22 ENCOUNTER — Ambulatory Visit (HOSPITAL_COMMUNITY)
Admission: RE | Admit: 2022-06-22 | Discharge: 2022-06-22 | Disposition: A | Discharge status: Home / Self Care | Payer: Medicare Other | Attending: Pulmonary Disease | Admitting: Pulmonary Disease

## 2022-06-22 ENCOUNTER — Ambulatory Visit (HOSPITAL_COMMUNITY): Payer: Medicare Other

## 2022-06-22 ENCOUNTER — Ambulatory Visit (HOSPITAL_BASED_OUTPATIENT_CLINIC_OR_DEPARTMENT_OTHER): Payer: Medicare Other | Admitting: Physician Assistant

## 2022-06-22 ENCOUNTER — Encounter (HOSPITAL_COMMUNITY)
Admission: RE | Disposition: A | Discharge status: Home / Self Care | Payer: Self-pay | Source: Home / Self Care | Attending: Pulmonary Disease

## 2022-06-22 ENCOUNTER — Other Ambulatory Visit: Payer: Self-pay

## 2022-06-22 ENCOUNTER — Ambulatory Visit (HOSPITAL_COMMUNITY): Payer: Medicare Other | Admitting: Physician Assistant

## 2022-06-22 ENCOUNTER — Encounter (HOSPITAL_COMMUNITY): Payer: Self-pay | Admitting: Pulmonary Disease

## 2022-06-22 DIAGNOSIS — Z7901 Long term (current) use of anticoagulants: Secondary | ICD-10-CM | POA: Diagnosis not present

## 2022-06-22 DIAGNOSIS — I119 Hypertensive heart disease without heart failure: Secondary | ICD-10-CM

## 2022-06-22 DIAGNOSIS — C3431 Malignant neoplasm of lower lobe, right bronchus or lung: Secondary | ICD-10-CM

## 2022-06-22 DIAGNOSIS — I4891 Unspecified atrial fibrillation: Secondary | ICD-10-CM

## 2022-06-22 DIAGNOSIS — E7800 Pure hypercholesterolemia, unspecified: Secondary | ICD-10-CM | POA: Insufficient documentation

## 2022-06-22 DIAGNOSIS — M1990 Unspecified osteoarthritis, unspecified site: Secondary | ICD-10-CM | POA: Insufficient documentation

## 2022-06-22 DIAGNOSIS — I083 Combined rheumatic disorders of mitral, aortic and tricuspid valves: Secondary | ICD-10-CM | POA: Diagnosis not present

## 2022-06-22 DIAGNOSIS — Z87891 Personal history of nicotine dependence: Secondary | ICD-10-CM | POA: Diagnosis not present

## 2022-06-22 DIAGNOSIS — Z853 Personal history of malignant neoplasm of breast: Secondary | ICD-10-CM | POA: Insufficient documentation

## 2022-06-22 DIAGNOSIS — I480 Paroxysmal atrial fibrillation: Secondary | ICD-10-CM | POA: Diagnosis not present

## 2022-06-22 DIAGNOSIS — R911 Solitary pulmonary nodule: Secondary | ICD-10-CM

## 2022-06-22 DIAGNOSIS — I10 Essential (primary) hypertension: Secondary | ICD-10-CM | POA: Insufficient documentation

## 2022-06-22 DIAGNOSIS — J439 Emphysema, unspecified: Secondary | ICD-10-CM | POA: Insufficient documentation

## 2022-06-22 DIAGNOSIS — I700 Atherosclerosis of aorta: Secondary | ICD-10-CM | POA: Insufficient documentation

## 2022-06-22 DIAGNOSIS — Z803 Family history of malignant neoplasm of breast: Secondary | ICD-10-CM | POA: Insufficient documentation

## 2022-06-22 DIAGNOSIS — R918 Other nonspecific abnormal finding of lung field: Secondary | ICD-10-CM | POA: Diagnosis not present

## 2022-06-22 HISTORY — PX: BRONCHIAL BIOPSY: SHX5109

## 2022-06-22 HISTORY — DX: Dyspnea, unspecified: R06.00

## 2022-06-22 HISTORY — PX: BRONCHIAL NEEDLE ASPIRATION BIOPSY: SHX5106

## 2022-06-22 LAB — CBC
HCT: 42.4 % (ref 36.0–46.0)
Hemoglobin: 13.5 g/dL (ref 12.0–15.0)
MCH: 28.9 pg (ref 26.0–34.0)
MCHC: 31.8 g/dL (ref 30.0–36.0)
MCV: 90.8 fL (ref 80.0–100.0)
Platelets: 194 10*3/uL (ref 150–400)
RBC: 4.67 MIL/uL (ref 3.87–5.11)
RDW: 14.4 % (ref 11.5–15.5)
WBC: 6.7 10*3/uL (ref 4.0–10.5)
nRBC: 0 % (ref 0.0–0.2)

## 2022-06-22 LAB — BASIC METABOLIC PANEL
Anion gap: 11 (ref 5–15)
BUN: 15 mg/dL (ref 8–23)
CO2: 23 mmol/L (ref 22–32)
Calcium: 9.9 mg/dL (ref 8.9–10.3)
Chloride: 106 mmol/L (ref 98–111)
Creatinine, Ser: 1.26 mg/dL — ABNORMAL HIGH (ref 0.44–1.00)
GFR, Estimated: 41 mL/min — ABNORMAL LOW (ref >60)
Glucose, Bld: 84 mg/dL (ref 70–99)
Potassium: 4.2 mmol/L (ref 3.5–5.1)
Sodium: 140 mmol/L (ref 135–145)

## 2022-06-22 LAB — PROTIME-INR
INR: 1.2 (ref 0.8–1.2)
Prothrombin Time: 15.1 seconds (ref 11.4–15.2)

## 2022-06-22 SURGERY — BRONCHOSCOPY, WITH BIOPSY USING ELECTROMAGNETIC NAVIGATION
Anesthesia: General | Laterality: Right

## 2022-06-22 MED ORDER — PHENYLEPHRINE HCL-NACL 20-0.9 MG/250ML-% IV SOLN
INTRAVENOUS | Status: DC | PRN
  Administered 2022-06-22: 100 ug/min via INTRAVENOUS

## 2022-06-22 MED ORDER — SUGAMMADEX SODIUM 200 MG/2ML IV SOLN
INTRAVENOUS | Status: DC | PRN
  Administered 2022-06-22: 122.4 mg via INTRAVENOUS

## 2022-06-22 MED ORDER — FENTANYL CITRATE (PF) 100 MCG/2ML IJ SOLN: 25.0000 ug | INTRAMUSCULAR | Status: DC | PRN

## 2022-06-22 MED ORDER — ETOMIDATE 2 MG/ML IV SOLN
INTRAVENOUS | Status: DC | PRN
  Administered 2022-06-22: 14 mg via INTRAVENOUS

## 2022-06-22 MED ORDER — ACETAMINOPHEN 500 MG PO TABS
1000.0000 mg | ORAL_TABLET | Freq: Once | ORAL | Status: AC
  Administered 2022-06-22: 1000 mg via ORAL
  Filled 2022-06-22: qty 2

## 2022-06-22 MED ORDER — ROCURONIUM BROMIDE 10 MG/ML (PF) SYRINGE
PREFILLED_SYRINGE | INTRAVENOUS | Status: DC | PRN
  Administered 2022-06-22: 50 mg via INTRAVENOUS

## 2022-06-22 MED ORDER — AMISULPRIDE (ANTIEMETIC) 5 MG/2ML IV SOLN: 10.0000 mg | Freq: Once | INTRAVENOUS | Status: DC | PRN

## 2022-06-22 MED ORDER — PROPOFOL 500 MG/50ML IV EMUL
INTRAVENOUS | Status: DC | PRN
  Administered 2022-06-22: 100 ug/kg/min via INTRAVENOUS

## 2022-06-22 MED ORDER — LIDOCAINE 2% (20 MG/ML) 5 ML SYRINGE
INTRAMUSCULAR | Status: DC | PRN
  Administered 2022-06-22: 100 mg via INTRAVENOUS

## 2022-06-22 MED ORDER — CHLORHEXIDINE GLUCONATE 0.12 % MT SOLN
OROMUCOSAL | Status: AC
  Administered 2022-06-22: 15 mL
  Filled 2022-06-22: qty 15

## 2022-06-22 MED ORDER — PROMETHAZINE HCL 25 MG/ML IJ SOLN: 6.2500 mg | INTRAMUSCULAR | Status: DC | PRN

## 2022-06-22 MED ORDER — LACTATED RINGERS IV SOLN: INTRAVENOUS | Status: DC

## 2022-06-22 MED ORDER — ONDANSETRON HCL 4 MG/2ML IJ SOLN
INTRAMUSCULAR | Status: DC | PRN
  Administered 2022-06-22: 4 mg via INTRAVENOUS

## 2022-06-22 SURGICAL SUPPLY — 29 items
BRUSH CYTOL CELLEBRITY 1.5X140 (MISCELLANEOUS) IMPLANT
CANISTER SUCT 3000ML PPV (MISCELLANEOUS) ×3 IMPLANT
CONT SPEC 4OZ CLIKSEAL STRL BL (MISCELLANEOUS) ×3 IMPLANT
COVER BACK TABLE 60X90IN (DRAPES) ×3 IMPLANT
COVER DOME SNAP 22 D (MISCELLANEOUS) ×3 IMPLANT
FORCEPS BIOP RJ4 1.8 (CUTTING FORCEPS) IMPLANT
GAUZE SPONGE 4X4 12PLY STRL (GAUZE/BANDAGES/DRESSINGS) ×3 IMPLANT
GLOVE BIO SURGEON STRL SZ7.5 (GLOVE) ×3 IMPLANT
GOWN STRL REUS W/ TWL LRG LVL3 (GOWN DISPOSABLE) ×3 IMPLANT
GOWN STRL REUS W/TWL LRG LVL3 (GOWN DISPOSABLE) ×3
KIT CLEAN ENDO COMPLIANCE (KITS) ×6 IMPLANT
KIT TURNOVER KIT B (KITS) ×3 IMPLANT
MARKER SKIN DUAL TIP RULER LAB (MISCELLANEOUS) ×3 IMPLANT
NDL EBUS SONO TIP PENTAX (NEEDLE) ×2 IMPLANT
NEEDLE EBUS SONO TIP PENTAX (NEEDLE) ×3 IMPLANT
NS IRRIG 1000ML POUR BTL (IV SOLUTION) ×3 IMPLANT
OIL SILICONE PENTAX (PARTS (SERVICE/REPAIRS)) ×3 IMPLANT
PAD ARMBOARD 7.5X6 YLW CONV (MISCELLANEOUS) ×6 IMPLANT
SOL ANTI FOG 6CC (MISCELLANEOUS) ×3 IMPLANT
SYR 20CC LL (SYRINGE) ×6 IMPLANT
SYR 20ML ECCENTRIC (SYRINGE) ×6 IMPLANT
SYR 50ML SLIP (SYRINGE) IMPLANT
SYR 5ML LUER SLIP (SYRINGE) ×3 IMPLANT
TOWEL OR 17X24 6PK STRL BLUE (TOWEL DISPOSABLE) ×3 IMPLANT
TRAP SPECIMEN MUCOUS 40CC (MISCELLANEOUS) IMPLANT
TUBE CONNECTING 20X1/4 (TUBING) ×6 IMPLANT
UNDERPAD 30X30 (UNDERPADS AND DIAPERS) ×3 IMPLANT
VALVE DISPOSABLE (MISCELLANEOUS) ×3 IMPLANT
WATER STERILE IRR 1000ML POUR (IV SOLUTION) ×3 IMPLANT

## 2022-06-22 NOTE — Op Note (Signed)
Video Bronchoscopy with Robotic Assisted Bronchoscopic Navigation   Date of Operation: 06/22/2022   Pre-op Diagnosis: Lung nodule  Post-op Diagnosis: Lung nodule   Surgeon: [name], DO   Assistants: None   Anesthesia: General endotracheal anesthesia  Operation: Flexible video fiberoptic bronchoscopy with robotic assistance and biopsies.  Estimated Blood Loss: Minimal  Complications: None  Indications and History: Martha Edwards is a 89 y.o. female with history of breast cancer, lung nodule. The risks, benefits, complications, treatment options and expected outcomes were discussed with the patient.  The possibilities of pneumothorax, pneumonia, reaction to medication, pulmonary aspiration, perforation of a viscus, bleeding, failure to diagnose a condition and creating a complication requiring transfusion or operation were discussed with the patient who freely signed the consent.    Description of Procedure: The patient was seen in the Preoperative Area, was examined and was deemed appropriate to proceed.  The patient was taken to [facility] endoscopy room 3, identified as Martha Edwards and the procedure verified as Flexible Video Fiberoptic Bronchoscopy.  A Time Out was held and the above information confirmed.   Prior to the date of the procedure a high-resolution CT scan of the chest was performed. Utilizing ION software program a virtual tracheobronchial tree was generated to allow the creation of distinct navigation pathways to the patient's parenchymal abnormalities. After being taken to the operating room general anesthesia was initiated and the patient  was orally intubated. The video fiberoptic bronchoscope was introduced via the endotracheal tube and a general inspection was performed which showed normal right and left lung anatomy, aspiration of the bilateral mainstems was completed to remove any remaining secretions. Robotic catheter inserted into patient's endotracheal tube.    Target #1 RLL: The distinct navigation pathways prepared prior to this procedure were then utilized to navigate to patient's lesion identified on CT scan. The robotic catheter was secured into place and the vision probe was withdrawn.  Lesion location was approximated using fluoroscopy and three-dimensional cone beam CT imaging for CT-guided needle placement for peripheral targeting. Under fluoroscopic guidance transbronchial needle brushings, transbronchial needle biopsies, and transbronchial forceps biopsies were performed to be sent for cytology and pathology.  At the end of the procedure a general airway inspection was performed and there was no evidence of active bleeding. The bronchoscope was removed.  The patient tolerated the procedure well. There was no significant blood loss and there were no obvious complications. A post-procedural chest x-ray is pending.  Samples Target #1: 1. Transbronchial Wang needle biopsies from RLL 2. Transbronchial forceps biopsies from RLL  Plans:  The patient will be discharged from the PACU to home when recovered from anesthesia and after chest x-ray is reviewed. We will review the cytology, pathology results with the patient when they become available. Outpatient followup will be with [name], DO.  [name], DO [facility] 06/22/2022 10:38 AM

## 2022-06-22 NOTE — Anesthesia Procedure Notes (Signed)
Procedure Name: Intubation Date/Time: 06/22/2022 9:58 AM  Performed by: Cy Blamer, CRNAPre-anesthesia Checklist: Patient identified, Emergency Drugs available, Suction available and Patient being monitored Patient Re-evaluated:Patient Re-evaluated prior to induction Oxygen Delivery Method: Circle system utilized Preoxygenation: Pre-oxygenation with 100% oxygen Induction Type: IV induction Ventilation: Mask ventilation without difficulty Laryngoscope Size: Miller and 2 Grade View: Grade I Tube type: Oral Tube size: 8.5 mm Number of attempts: 1 Airway Equipment and Method: Stylet Placement Confirmation: ETT inserted through vocal cords under direct vision, positive ETCO2 and breath sounds checked- equal and bilateral Secured at: 21 cm Tube secured with: Tape Dental Injury: Teeth and Oropharynx as per pre-operative assessment

## 2022-06-22 NOTE — Transfer of Care (Signed)
Immediate Anesthesia Transfer of Care Note  Patient: Martha Edwards  Procedure(s) Performed: ROBOTIC ASSISTED NAVIGATIONAL BRONCHOSCOPY (Right) BRONCHIAL NEEDLE ASPIRATION BIOPSIES BRONCHIAL BIOPSIES  Patient Location: PACU  Anesthesia Type:General  Level of Consciousness: awake, alert , oriented, patient cooperative, and responds to stimulation  Airway & Oxygen Therapy: Patient Spontanous Breathing and Patient connected to face mask oxygen  Post-op Assessment: Report given to RN, Post -op Vital signs reviewed and stable, Patient moving all extremities X 4, and Patient able to stick tongue midline  Post vital signs: Reviewed  Last Vitals:  Vitals Value Taken Time  BP 127/75 06/22/22 1039  Temp 97.8   Pulse 68   Resp 15   SpO2 100   Vitals shown include unvalidated device data.  Last Pain:  Vitals:   06/22/22 0708  TempSrc: Oral  PainSc: 0-No pain         Complications: No notable events documented.

## 2022-06-22 NOTE — Interval H&P Note (Signed)
History and Physical Interval Note:  06/22/2022 8:54 AM  Martha Edwards  has presented today for surgery, with the diagnosis of lung nodule.  The various methods of treatment have been discussed with the patient and family. After consideration of risks, benefits and other options for treatment, the patient has consented to  Procedure(s): ROBOTIC ASSISTED NAVIGATIONAL BRONCHOSCOPY (Right) VIDEO BRONCHOSCOPY WITH ENDOBRONCHIAL ULTRASOUND (N/A) as a surgical intervention.  The patient's history has been reviewed, patient examined, no change in status, stable for surgery.  I have reviewed the patient's chart and labs.  Questions were answered to the patient's satisfaction.     [name]

## 2022-06-22 NOTE — Anesthesia Postprocedure Evaluation (Signed)
Anesthesia Post Note  Patient: Martha Edwards  Procedure(s) Performed: ROBOTIC ASSISTED NAVIGATIONAL BRONCHOSCOPY (Right) BRONCHIAL NEEDLE ASPIRATION BIOPSIES BRONCHIAL BIOPSIES     Patient location during evaluation: PACU Anesthesia Type: General Level of consciousness: sedated Pain management: pain level controlled Vital Signs Assessment: post-procedure vital signs reviewed and stable Respiratory status: spontaneous breathing and respiratory function stable Cardiovascular status: stable Postop Assessment: no apparent nausea or vomiting Anesthetic complications: no   No notable events documented.  Last Vitals:  Vitals:   06/22/22 1100 06/22/22 1110  BP: 129/78 (!) 138/95  Pulse: 61 66  Resp: 19 18  Temp:  36.6 C  SpO2: 94% 95%    Last Pain:  Vitals:   06/22/22 1110  TempSrc:   PainSc: 0-No pain                 Fable Huisman DANIEL

## 2022-06-22 NOTE — Discharge Instructions (Signed)

## 2022-06-23 LAB — CYTOLOGY - NON PAP

## 2022-06-24 ENCOUNTER — Ambulatory Visit: Payer: Medicare Other | Attending: Cardiology | Admitting: *Deleted

## 2022-06-24 DIAGNOSIS — I4892 Unspecified atrial flutter: Secondary | ICD-10-CM | POA: Insufficient documentation

## 2022-06-24 DIAGNOSIS — Z5181 Encounter for therapeutic drug level monitoring: Secondary | ICD-10-CM | POA: Diagnosis not present

## 2022-06-24 LAB — POCT INR: POC INR: 1.1

## 2022-06-24 NOTE — Progress Notes (Signed)
Pathology results consistent with adenocarcinoma.  Patient has an appointment with Kandice Robinsons, NP to discuss results on 06/29/2022.  Thanks,  BLI  [name], DO [facility] 06/24/2022 11:32 AM

## 2022-06-24 NOTE — Patient Instructions (Signed)
Description   Take 2 tablets of warfarin today and tomorrow, then continue to take warfarin 1.5 tablets of warfarin daily excpet for 2 tablets on Sundays. Recheck INR in 1 week. [facility] [phone]

## 2022-06-27 ENCOUNTER — Encounter (HOSPITAL_COMMUNITY): Payer: Self-pay | Admitting: Pulmonary Disease

## 2022-06-29 ENCOUNTER — Telehealth: Payer: Self-pay | Admitting: Radiation Oncology

## 2022-06-29 ENCOUNTER — Encounter: Payer: Self-pay | Admitting: Acute Care

## 2022-06-29 ENCOUNTER — Ambulatory Visit (INDEPENDENT_AMBULATORY_CARE_PROVIDER_SITE_OTHER): Payer: Medicare Other | Admitting: Acute Care

## 2022-06-29 VITALS — BP 122/74 | HR 86 | Temp 97.7°F | Ht 69.0 in | Wt 138.6 lb

## 2022-06-29 DIAGNOSIS — Z853 Personal history of malignant neoplasm of breast: Secondary | ICD-10-CM

## 2022-06-29 DIAGNOSIS — C3431 Malignant neoplasm of lower lobe, right bronchus or lung: Secondary | ICD-10-CM

## 2022-06-29 DIAGNOSIS — Z87891 Personal history of nicotine dependence: Secondary | ICD-10-CM

## 2022-06-29 NOTE — Patient Instructions (Addendum)
It is good to see you today. Your biopsy was positive for cancer in the Right Lower Lobe biopsy. I will refer you to both radiation oncology and medical oncology to discuss treatment options  You will get a call to schedule both consultation appointments. Once you have determined the best option for treatment , they will take great care of you. Follow up as needed Please contact office for sooner follow up if symptoms do not improve or worsen or seek emergency care

## 2022-06-29 NOTE — Telephone Encounter (Signed)
5/28 @ 11:40 am Left voicemail for patient to call our office to be schedule for consult.

## 2022-06-29 NOTE — Progress Notes (Signed)
History of Present Illness Martha Edwards is a 89 y.o. female former smoker, quit 1986, past medical history of arthritis, history of right-sided breast cancer ( Stage 1A N0, M0, )hypertension, and hypercholesterolemia.  She was referred to Dr. Tonia Brooms for consideration of   bronchoscopy and biopsy of right pulmonary nodule noted on CT imaging. She underwent Flexible video fiberoptic bronchoscopy with robotic assistance and biopsies on 06/22/2022. She is here today for follow up.    06/29/2022 Pt. Presents for follow up. She states he has been doing well after her procedure. She denies any hemoptysis, fever or discolored secretions. No breathing difficulties.  We have reviewed the results of her biopsy. I explained that her biopsy was positive for non-small cell adenocarcinoma. She has a history of breast cancer, and had seen Dr.Magrinat in the past. He has however retired. I told her we would refer her to radiation oncology as well as medical oncology to discuss best options for treatment . She is in agreement with this plan. She had no further questions, but I told her to call if she had any additional questions or concerns. She is a very kind patient. I told her we will take good care of her.   Test Results: 06/22/2022 FINAL MICROSCOPIC DIAGNOSIS:  A. LUNG, RLL, FINE NEEDLE ASPIRATION AND BIOPSY:  - Malignant cells consistent with non small cell adenocarcinoma    06/02/2022 CT Chest  Persistent 12 mm spiculated solid nodule in anterior left upper lobe, which strongly favors primary bronchogenic carcinoma over pulmonary metastasis.   Stable bilateral ground-glass nodules measuring up to 11 mm. Low-grade adenocarcinoma cannot be excluded. Non-contrast chest CT at 3-6 months is recommended.     Latest Ref Rng & Units 06/22/2022    7:10 AM 04/08/2021    9:44 AM 04/02/2020    9:32 AM  CBC  WBC 4.0 - 10.5 K/uL 6.7  7.7  7.2   Hemoglobin 12.0 - 15.0 g/dL 16.1  09.6  04.5   Hematocrit 36.0 - 46.0  % 42.4  42.5  40.7   Platelets 150 - 400 K/uL 194  226  206        Latest Ref Rng & Units 06/22/2022    7:10 AM 04/08/2021    9:44 AM 04/02/2020    9:32 AM  BMP  Glucose 70 - 99 mg/dL 84  90  87   BUN 8 - 23 mg/dL 15  14  15    Creatinine 0.44 - 1.00 mg/dL 4.09  8.11  9.14   Sodium 135 - 145 mmol/L 140  144  143   Potassium 3.5 - 5.1 mmol/L 4.2  3.6  3.9   Chloride 98 - 111 mmol/L 106  107  109   CO2 22 - 32 mmol/L 23  28  25    Calcium 8.9 - 10.3 mg/dL 9.9  78.2  9.9     BNP No results found for: "BNP"  ProBNP No results found for: "PROBNP"  PFT No results found for: "FEV1PRE", "FEV1POST", "FVCPRE", "FVCPOST", "TLC", "DLCOUNC", "PREFEV1FVCRT", "PSTFEV1FVCRT"  DG Chest Port 1 View  Result Date: 06/22/2022 CLINICAL DATA:  Bronchoscopy EXAM: PORTABLE CHEST 1 VIEW COMPARISON:  04/26/2022 FINDINGS: Cardiomegaly. Aortic atherosclerosis. Hyperinflated lungs. Patchy opacity in the right lung base likely representing post biopsy changes. No pleural effusion or pneumothorax. IMPRESSION: Patchy opacity in the right lung base likely representing post biopsy changes. No pneumothorax. Electronically Signed   By: Duanne Guess D.O.   On: 06/22/2022 11:03   DG  C-ARM BRONCHOSCOPY  Result Date: 06/22/2022 C-ARM BRONCHOSCOPY: Fluoroscopy was utilized by the requesting physician.  No radiographic interpretation.   CT CHEST ABDOMEN PELVIS W CONTRAST  Result Date: 06/06/2022 CLINICAL DATA:  Recently diagnosed breast carcinoma. Staging. Follow-up pulmonary nodules and left renal lesion. * Tracking Code: BO * EXAM: CT CHEST, ABDOMEN, AND PELVIS WITH CONTRAST TECHNIQUE: Multidetector CT imaging of the chest, abdomen and pelvis was performed following the standard protocol during bolus administration of intravenous contrast. RADIATION DOSE REDUCTION: This exam was performed according to the departmental dose-optimization program which includes automated exposure control, adjustment of the mA and/or kV  according to patient size and/or use of iterative reconstruction technique. CONTRAST:  OMNIPAQUE IOHEXOL 300 MG/ML  SOLN COMPARISON:  04/26/2022 FINDINGS: CT CHEST FINDINGS Cardiovascular: No acute findings. Aortic and coronary atherosclerotic calcification incidentally noted. Aberrant origin of right subclavian artery again noted. Mediastinum/Lymph Nodes: No masses or pathologically enlarged lymph nodes identified. Stable 6 mm lymph node in the right cardiophrenic angle is not pathologically enlarged. Lungs/Pleura: Mild centrilobular emphysema again noted. Spiculated nodule is again seen in the anterior right lower lobe measuring 12 x 9 mm on image 114/4, without significant change since recent exam. Additional ground-glass nodules in the posterior left upper lobe on image 67/4 and the superior segment of the right lower lobe also stable, measuring up to 11 mm. No new or enlarging nodules identified. Evidence of pleural effusion. Musculoskeletal:  No suspicious bone lesions identified. CT ABDOMEN AND PELVIS FINDINGS Hepatobiliary: No masses identified. Gallbladder is unremarkable. No evidence of biliary ductal dilatation. Pancreas:  No mass or inflammatory changes. Spleen:  Within normal limits in size and appearance. Adrenals/Urinary tract: Normal adrenal glands. 8 mm subcapsular lesion in lower pole of left kidney is stable but measures higher than fluid attenuation. This could represent a hemorrhagic cyst or solid renal neoplasm. No evidence of ureteral calculi or hydronephrosis. Stomach/Bowel: No evidence of obstruction, inflammatory process, or abnormal fluid collections. Vascular/Lymphatic: No pathologically enlarged lymph nodes identified. No acute vascular findings. Aortic atherosclerotic calcification incidentally noted. Reproductive: No mass or other significant abnormality identified. Diffuse colonic diverticulosis is seen, without evidence of diverticulitis. Other:  None. Musculoskeletal:  No  suspicious bone lesions identified. IMPRESSION: Persistent 12 mm spiculated solid nodule in anterior left upper lobe, which strongly favors primary bronchogenic carcinoma over pulmonary metastasis. Stable bilateral ground-glass nodules measuring up to 11 mm. Low-grade adenocarcinoma cannot be excluded. Non-contrast chest CT at 3-6 months is recommended. If nodules persist, subsequent management will be based upon the most suspicious nodule(s). This recommendation follows the consensus statement: Guidelines for Management of Incidental Pulmonary Nodules Detected on CT Images: From the Fleischner Society 2017; Radiology 2017; 284:228-243. No definite evidence of metastatic disease within the chest, abdomen, or pelvis. Stable 8 mm subcapsular lesion in lower pole of left kidney, which is difficult to characterize due to its small size and could represent a hemorrhagic cyst or solid renal neoplasm. Recommend continued attention on follow-up CT given its small size. Colonic diverticulosis, without radiographic evidence of diverticulitis. Aortic Atherosclerosis (ICD10-I70.0) and Emphysema (ICD10-J43.9). Electronically Signed   By: Danae Orleans M.D.   On: 06/06/2022 15:42     Past medical hx Past Medical History:  Diagnosis Date   Anticoagulated    Anxiety    Arthritis of hand    Atrial flutter (HCC)    with LVEF 60%, LVH, LAE and holter with rate control and Ave HR 60 bpm    Breast cancer (HCC) 2017  RIGHT BREAST CA   Carotid artery plaque    mild bilateral carotid plaque without significant stenosis (<20% in left ICA), 04/2010   CKD (chronic kidney disease) stage 2, GFR 60-89 ml/min    Pt states she does have this   Diverticula, colon    Dyspnea    Family history of breast cancer    Family history of prostate cancer    Hypercholesterolemia    Hypertension    with LVH on echo 06/03/11   Hypertonicity of bladder    Insomnia    Mitral regurgitation    Osteopenia    > osteoporosis   Overactive  bladder      Social History   Tobacco Use   Smoking status: Former    Types: Cigarettes    Quit date: 02/02/1984    Years since quitting: 38.4   Smokeless tobacco: Never  Substance Use Topics   Alcohol use: Not Currently    Ms.Nodal reports that she quit smoking about 38 years ago. Her smoking use included cigarettes. She has never used smokeless tobacco. She reports that she does not currently use alcohol.  Tobacco Cessation: Former smoker quit 1986   Past surgical hx, Family hx, Social hx all reviewed.  Current Outpatient Medications on File Prior to Visit  Medication Sig   acetaminophen (TYLENOL) 325 MG tablet Take 650 mg by mouth every 6 (six) hours as needed for mild pain.   amLODipine (NORVASC) 5 MG tablet Take 1 tablet (5 mg total) by mouth daily. (Patient taking differently: Take 5 mg by mouth daily. Takes in the afternoon per patient)   atorvastatin (LIPITOR) 40 MG tablet Take 40 mg by mouth daily.   CALCIUM PO Take 1 tablet by mouth 2 (two) times daily.   Cholecalciferol (VITAMIN D3) 25 MCG (1000 UT) CAPS Take 1,000 Units by mouth daily.   Denosumab (PROLIA [place]) Take as directed once every 6 months.   mirtazapine (REMERON) 30 MG tablet Take 60 mg by mouth at bedtime.   warfarin (COUMADIN) 4 MG tablet TAKE AS DIRECTED BY COUMADIN CLINIC **90 DAY SUPPLY (Patient taking differently: Take 4 mg by mouth See admin instructions. Take 6 mg daily at 1400<EXCEPT Sunday took 8 mg  TAKE AS DIRECTED BY COUMADIN CLINIC **90 DAY SUPPLY)   Zoster Vaccine Adjuvanted [facility]) injection Inject into the muscle.   No current facility-administered medications on file prior to visit.     No Known Allergies  Review Of Systems:  Constitutional:   No  weight loss, night sweats,  Fevers, chills, fatigue, or  lassitude.  HEENT:   No headaches,  Difficulty swallowing,  Tooth/dental problems, or  Sore throat,                No sneezing, itching, ear ache, nasal congestion, post nasal drip,    CV:  No chest pain,  Orthopnea, PND, swelling in lower extremities, anasarca, dizziness, palpitations, syncope.   GI  No heartburn, indigestion, abdominal pain, nausea, vomiting, diarrhea, change in bowel habits, loss of appetite, bloody stools.   Resp: No shortness of breath with exertion or at rest.  No excess mucus, no productive cough,  No non-productive cough,  No coughing up of blood.  No change in color of mucus.  No wheezing.  No chest wall deformity  Skin: no rash or lesions.  GU: no dysuria, change in color of urine, no urgency or frequency.  No flank pain, no hematuria   MS:  No joint pain or swelling.  No decreased range of motion.  No back pain.  Psych:  No change in mood or affect. No depression or anxiety.  No memory loss.   Vital Signs BP 122/74 (BP Location: Left Arm, Patient Position: Sitting, Cuff Size: Normal)   Pulse 86   Temp 97.7 F (36.5 C) (Oral)   Ht 5\' 9"  (1.753 m)   Wt 138 lb 9.6 oz (62.9 kg)   SpO2 95%   BMI 20.47 kg/m    Physical Exam:  General- No distress,  A&Ox3, pleasant ENT: No sinus tenderness, TM clear, pale nasal mucosa, no oral exudate,no post nasal drip, no LAN Cardiac: S1, S2, regular rate and rhythm, no murmur Chest: No wheeze/ rales/ dullness; no accessory muscle use, no nasal flaring, no sternal retractions, slightly diminished per bases Abd.: Soft Non-tender, ND, BS +, Body mass index is 20.47 kg/m.  Ext: No clubbing cyanosis, edema Neuro:  normal strength, MAE x 4, A&O x 3, appropriate Skin: No rashes, warm and dry, no lesions  Psych: normal mood and behavior   Assessment/Plan New diagnosis non-small cell lung cancer right lower lobe  History Breast cancer , right side Former smoker quit 1986 Plan Your biopsy was positive for cancer in the Right Lower Lobe biopsy. I will refer you to both radiation oncology and medical oncology to discuss treatment options  You will get a call to schedule both consultation  appointments. Once you have determined the best option for treatment , they will take great care of you. Follow up as needed Please contact office for sooner follow up if symptoms do not improve or worsen or seek emergency care    I spent 30 minutes dedicated to the care of this patient on the date of this encounter to include pre-visit review of records, face-to-face time with the patient discussing conditions above, post visit ordering of testing, clinical documentation with the electronic health record, making appropriate referrals as documented, and communicating necessary information to the patient's healthcare team.   Bevelyn Ngo, NP 06/29/2022  11:11 AM

## 2022-06-29 NOTE — Progress Notes (Signed)
Thoracic Location of Tumor / Histology: Malignant neoplasm of lower lobe of right lung   CT CHEST ABDOMEN PELVIS W CONTRAST  06/02/2022  IMPRESSION: Persistent 12 mm spiculated solid nodule in anterior left upper lobe, which strongly favors primary bronchogenic carcinoma over pulmonary metastasis.   Stable bilateral ground-glass nodules measuring up to 11 mm. Low-grade adenocarcinoma cannot be excluded. Non-contrast chest CT at 3-6 months is recommended. If nodules persist, subsequent management will be based upon the most suspicious nodule(s). This recommendation follows the consensus statement: Guidelines for Management of Incidental Pulmonary Nodules Detected on CT Images: From the Fleischner Society 2017; Radiology 2017; 284:228-243.   No definite evidence of metastatic disease within the chest, abdomen, or pelvis.   Stable 8 mm subcapsular lesion in lower pole of left kidney, which is difficult to characterize due to its small size and could represent a hemorrhagic cyst or solid renal neoplasm. Recommend continued attention on follow-up CT given its small size.   Colonic diverticulosis, without radiographic evidence of diverticulitis.   Aortic Atherosclerosis (ICD10-I70.0) and Emphysema (ICD10-J43.9).   Patient presented with symptoms of: Found on CT  Biopsies revealed:  06-22-22 FINAL MICROSCOPIC DIAGNOSIS:  - Malignant cells consistent with adenocarcinoma  - See comment   SPECIMEN ADEQUACY:  Satisfactory for evaluation   DIAGNOSTIC COMMENTS:  The case was peer-reviewed by Drs. Luisa Hart and Picklesimer who agree  with non-small cell carcinoma favor adenocarcinoma.   Tobacco/Marijuana/Snuff/ETOH use: former smoker quit 30+ years ago   Past/Anticipated interventions by cardiothoracic surgery, if any:  Video Bronchoscopy with Robotic Assisted Bronchoscopic Navigation    Date of Operation: 06/22/2022    Pre-op Diagnosis: Lung nodule   Post-op Diagnosis: Lung nodule     Surgeon: [name], DO   Past/Anticipated interventions by medical oncology, if any:  06-07-22  Martha Anes NP ASSESSMENT and THERAPY PLAN:    Carcinoma of upper-inner quadrant of right female breast (HCC) Martha Edwards is an 89 year old woman with history of stage Ia ER/PR positive invasive ductal carcinoma of the right breast diagnosed in January 2017.  She is status post lumpectomy followed by adjuvant antiestrogen therapy with anastrozole that began in April 2017 through April 2022.   History of right breast stage Ia ER/PR positive breast cancer: She continues on observation alone with annual mammograms which she will continue. Lung nodule noted on CT chest abdomen pelvis: I reviewed the scan results with her in detail.  I let her know that I am concerned she might have lung cancer.  I recommended that she follow-up with pulmonologist Dr. Tonia Brooms to have this nodule biopsied.  She is scheduled to meet with him on May 9 at 11 AM. Weight loss: It appears that her weight loss has stabilized.  She is eating and drinking well.  It could be that her weight decrease was related to turning to more normal activities after the pandemic.   We will await biopsy results to determine next steps for Martha Edwards's appointments here at the cancer center.  I let her know that I will be in touch with Dr. Tonia Brooms.  Signs/Symptoms Weight changes, if any: {:18581} Respiratory complaints, if any: {:18581} Hemoptysis, if any: {:18581} Pain issues, if any:  {:18581}  SAFETY ISSUES: Prior radiation? {:18581} Pacemaker/ICD? {:18581}  Possible current pregnancy?no Is the patient on methotrexate? no  Current Complaints / other details:  ***

## 2022-07-01 ENCOUNTER — Ambulatory Visit: Payer: Medicare Other | Attending: Cardiology

## 2022-07-01 ENCOUNTER — Encounter: Payer: Self-pay | Admitting: Radiation Oncology

## 2022-07-01 ENCOUNTER — Other Ambulatory Visit: Payer: Self-pay

## 2022-07-01 ENCOUNTER — Telehealth: Payer: Self-pay

## 2022-07-01 DIAGNOSIS — Z5181 Encounter for therapeutic drug level monitoring: Secondary | ICD-10-CM | POA: Diagnosis not present

## 2022-07-01 LAB — POCT INR: INR: 2 (ref 2.0–3.0)

## 2022-07-01 NOTE — Telephone Encounter (Signed)
Pt called to obtain meaningful use and nurse evaluation information. Consult note completed and routed to Dr. Basilio Cairo.

## 2022-07-01 NOTE — Patient Instructions (Signed)
continue to take warfarin 1.5 tablets of warfarin daily except for 2 tablets on Sundays. Recheck INR in 4 weeks. [facility] [phone]

## 2022-07-01 NOTE — Progress Notes (Signed)
Radiation Oncology         [phone] ________________________________  Initial Outpatient Consultation  Name: Martha Edwards MRN: [id number]  Date: 07/02/2022  DOB: Jun 08, 1932  CC:Martha Palmer, MD  Martha Igo, DO   REFERRING PHYSICIAN: [name], DO  DIAGNOSIS:    ICD-10-CM   1. Malignant neoplasm of lower lobe of right lung (HCC)  C34.31 NM PET Image Initial (PI) Skull Base To Thigh     Adenocarcinoma of the right lower lobe   History of right breast invasive ductal carcinoma diagnosed in 2017: s/p lumpectomy and antiestrogens (declined XRT)   Cancer Staging  Carcinoma of upper-inner quadrant of right female breast [facility]) Staging form: Breast, AJCC 7th Edition - Pathologic stage from 04/07/2015: Stage Unknown (T1c, NX, cM0) - Unsigned Laterality: Right - Clinical: Stage IA (T1c, N0, M0) - Signed by Martha Dell, MD on 05/16/2015  Malignant neoplasm of lower lobe of right lung [facility]) Staging form: Lung, AJCC 8th Edition - Clinical stage from 07/02/2022: Stage IA2 (cT1b, cN0, cM0) - Signed by Martha Peak, MD on 07/03/2022 Stage prefix: Initial diagnosis   CHIEF COMPLAINT: Here to discuss management of right lung cancer  HISTORY OF PRESENT ILLNESS::Martha Edwards is a 89 y.o. female who presents today to discuss the role of radiation therapy in management of her recently diagnosed right lung cancer. The patient is known to me for her history of right breast cancer diagnosed in 2017. She was seen in consultation but ultimately declined radiation therapy. She has since completed antiestrogen therapy consisting of anastrozole in April of 2022 and has continued on observation with Dr. Pamelia Edwards. Her history pertaining to her recent diagnosis or right lung cancer is detailed as follows.   During a follow up visit with medical oncology on 04/09/22, the patient reported an unintentional weight loss of approximately 20 pounds over the past 2 years. Given her symptoms, a  CT CAP was recommended for further evaluation.   Subsequent CT CAP on 04/26/22 revealed: a 9 x 12 mm irregular nodular consolidation in the anterior RLL possibly concerning for malignancy. CT also showed additional ground-glass nodules bilaterally, as well as an indeterminate exophytic lesion of the lower left kidney.  Given the overall indeterminate appearance of the RLL nodularity, she was prescribed Zpak and recommended to proceed with repeat imaging in 3-4 weeks.  Follow-up CT CAP on 06/02/22 demonstrated persistence of the anterior RLL nodularity (stable in size). Stability was also seen pertaining to the additional ground-glass nodules in the posterior left upper lobe and the superior segment of the right lower lobe (measuring up to 11 mm). No new or enlarging nodules identified overall.  Accordingly, the patient was referred to pulmonology and she opted to proceed with bronchoscopy with biopsies of the RLL on 06/22/22. Pathology revealed findings consistent with adenocarcinoma.   Wt Readings from Last 3 Encounters:  06/29/22 138 lb 9.6 oz (62.9 kg)  06/22/22 135 lb (61.2 kg)  06/10/22 140 lb 3.2 oz (63.6 kg)   She has lost about 10 lb in 2 yr Quit smoking 30+ yr ago  Signs/Symptoms Weight changes, if any: yes, pt has noticed weight loss in the past two months (not trying to lose weight) Respiratory complaints, if any: no Hemoptysis, if any: no Pain issues, if any:  no  SAFETY ISSUES: Prior radiation? no Pacemaker/ICD? no  Possible current pregnancy?no Is the patient on methotrexate? no   PREVIOUS RADIATION THERAPY: No  PAST MEDICAL HISTORY:  has a past  medical history of Anticoagulated, Anxiety, Arthritis of hand, Atrial flutter (HCC), Breast cancer (HCC) (2017), Carotid artery plaque, CKD (chronic kidney disease) stage 2, GFR 60-89 ml/min, Diverticula, colon, Dyspnea, Family history of breast cancer, Family history of prostate cancer, Hypercholesterolemia, Hypertension,  Hypertonicity of bladder, Insomnia, Mitral regurgitation, Osteopenia, and Overactive bladder.    PAST SURGICAL HISTORY: Past Surgical History:  Procedure Laterality Date   BREAST BIOPSY     BREAST LUMPECTOMY Right    2017   BREAST LUMPECTOMY WITH RADIOACTIVE SEED LOCALIZATION Right 04/07/2015   Procedure: RIGHT BREAST LUMPECTOMY WITH RADIOACTIVE SEED LOCALIZATION;  Surgeon: Martha Fellows, MD;  Location: [facility];  Service: General;  Laterality: Right;   BRONCHIAL BIOPSY  06/22/2022   Procedure: BRONCHIAL BIOPSIES;  Surgeon: Martha Igo, DO;  Location: [facility];  Service: Pulmonary;;   BRONCHIAL NEEDLE ASPIRATION BIOPSY  06/22/2022   Procedure: BRONCHIAL NEEDLE ASPIRATION BIOPSIES;  Surgeon: Martha Igo, DO;  Location: [facility];  Service: Pulmonary;;   CATARACT EXTRACTION W/ INTRAOCULAR LENS  IMPLANT, BILATERAL Bilateral    2017-2018   COLONOSCOPY  2001   2007 sigmoidoscopy     FAMILY HISTORY: family history includes Breast cancer (age of onset: 47) in her sister; Diabetes in her mother; Liver cancer in her mother; Prostate cancer (age of onset: 56) in her brother.  SOCIAL HISTORY:  reports that she quit smoking about 38 years ago. Her smoking use included cigarettes. She has never used smokeless tobacco. She reports that she does not currently use alcohol.  ALLERGIES: Patient has no known allergies.  MEDICATIONS:  Current Outpatient Medications  Medication Sig Dispense Refill   acetaminophen (TYLENOL) 325 MG tablet Take 650 mg by mouth every 6 (six) hours as needed for mild pain.     amLODipine (NORVASC) 5 MG tablet Take 1 tablet (5 mg total) by mouth daily. (Patient taking differently: Take 5 mg by mouth daily. Takes in the afternoon per patient) 90 tablet 3   atorvastatin (LIPITOR) 40 MG tablet Take 40 mg by mouth daily.     CALCIUM PO Take 1 tablet by mouth 2 (two) times daily.     Cholecalciferol (VITAMIN D3) 25 MCG (1000 UT) CAPS Take 1,000  Units by mouth daily.     Denosumab (PROLIA [place]) Take as directed once every 6 months.     mirtazapine (REMERON) 30 MG tablet Take 60 mg by mouth at bedtime.     warfarin (COUMADIN) 4 MG tablet TAKE AS DIRECTED BY COUMADIN CLINIC **90 DAY SUPPLY (Patient taking differently: Take 4 mg by mouth See admin instructions. Take 6 mg daily at 1400<EXCEPT Sunday took 8 mg  TAKE AS DIRECTED BY COUMADIN CLINIC **90 DAY SUPPLY) 150 tablet 1   Zoster Vaccine Adjuvanted [facility]) injection Inject into the muscle. 0.5 mL 1   No current facility-administered medications for this encounter.    REVIEW OF SYSTEMS:  Notable for that above.   PHYSICAL EXAM:  vitals were not taken for this visit.   General: Alert and oriented, in no acute distress  HEENT: Head is normocephalic. Extraocular movements are intact. Heart: Regular in rate and rhythm with no murmurs, rubs, or gallops. Chest: Clear to auscultation bilaterally, with no rhonchi, wheezes, or rales. Abdomen: Soft, nontender, nondistended, with no rigidity or guarding. Extremities: No cyanosis or edema. Lymphatics: see Neck Exam Skin: No concerning lesions. Musculoskeletal: symmetric strength and muscle tone throughout. Neurologic: Cranial nerves II through XII are grossly intact. No obvious focalities. Speech is fluent. Coordination  is intact. Psychiatric: Judgment and insight are intact. Affect is appropriate.   ECOG = 1  0 - Asymptomatic (Fully active, able to carry on all predisease activities without restriction)  1 - Symptomatic but completely ambulatory (Restricted in physically strenuous activity but ambulatory and able to carry out work of a light or sedentary nature. For example, light housework, office work)  2 - Symptomatic, <50% in bed during the day (Ambulatory and capable of all self care but unable to carry out any work activities. Up and about more than 50% of waking hours)  3 - Symptomatic, >50% in bed, but not bedbound (Capable of  only limited self-care, confined to bed or chair 50% or more of waking hours)  4 - Bedbound (Completely disabled. Cannot carry on any self-care. Totally confined to bed or chair)  5 - Death   Santiago Glad MM, Creech RH, Tormey DC, et al. [phone]). "Toxicity and response criteria of the [facility]". Am. Evlyn Clines. Oncol. 5 (6): 649-55   LABORATORY DATA:  Lab Results  Component Value Date   WBC 6.7 06/22/2022   HGB 13.5 06/22/2022   HCT 42.4 06/22/2022   MCV 90.8 06/22/2022   PLT 194 06/22/2022   CMP     Component Value Date/Time   NA 140 06/22/2022 0710   NA 142 10/21/2016 1334   K 4.2 06/22/2022 0710   K 4.2 10/21/2016 1334   CL 106 06/22/2022 0710   CO2 23 06/22/2022 0710   CO2 25 10/21/2016 1334   GLUCOSE 84 06/22/2022 0710   GLUCOSE 85 10/21/2016 1334   BUN 15 06/22/2022 0710   BUN 11.5 10/21/2016 1334   CREATININE 1.26 (H) 06/22/2022 0710   CREATININE 1.12 (H) 04/08/2021 0944   CREATININE 0.9 10/21/2016 1334   CALCIUM 9.9 06/22/2022 0710   CALCIUM 9.9 10/21/2016 1334   PROT 7.3 04/08/2021 0944   PROT 7.2 10/21/2016 1334   ALBUMIN 4.5 04/08/2021 0944   ALBUMIN 4.1 10/21/2016 1334   AST 20 04/08/2021 0944   AST 19 10/21/2016 1334   ALT 11 04/08/2021 0944   ALT 10 10/21/2016 1334   ALKPHOS 66 04/08/2021 0944   ALKPHOS 49 10/21/2016 1334   BILITOT 0.6 04/08/2021 0944   BILITOT 0.76 10/21/2016 1334   GFRNONAA 41 (L) 06/22/2022 0710   GFRNONAA 48 (L) 04/08/2021 0944   GFRAA 53 (L) 10/05/2019 0947         RADIOGRAPHY: DG Chest Port 1 View  Result Date: 06/22/2022 CLINICAL DATA:  Bronchoscopy EXAM: PORTABLE CHEST 1 VIEW COMPARISON:  04/26/2022 FINDINGS: Cardiomegaly. Aortic atherosclerosis. Hyperinflated lungs. Patchy opacity in the right lung base likely representing post biopsy changes. No pleural effusion or pneumothorax. IMPRESSION: Patchy opacity in the right lung base likely representing post biopsy changes. No pneumothorax. Electronically  Signed   By: Duanne Guess D.O.   On: 06/22/2022 11:03   DG C-ARM BRONCHOSCOPY  Result Date: 06/22/2022 C-ARM BRONCHOSCOPY: Fluoroscopy was utilized by the requesting physician.  No radiographic interpretation.      IMPRESSION/PLAN:  Cancer Staging  Carcinoma of upper-inner quadrant of right female breast [facility]) Staging form: Breast, AJCC 7th Edition - Pathologic stage from 04/07/2015: Stage Unknown (T1c, NX, cM0) - Unsigned Laterality: Right - Clinical: Stage IA (T1c, N0, M0) - Signed by Martha Dell, MD on 05/16/2015  Malignant neoplasm of lower lobe of right lung [facility]) Staging form: Lung, AJCC 8th Edition - Clinical stage from 07/02/2022: Stage IA2 (cT1b, cN0, cM0) - Signed by Basilio Cairo,  Maralyn Sago, MD on 07/03/2022 Stage prefix: Initial diagnosis   Today, I talked to the patient about the findings and work-up thus far.  We discussed the patient's diagnosis of lung cancer and general treatment for this, highlighting the role of radiotherapy in the management.  We discussed the available radiation techniques, and focused on the details of logistics and delivery.     We discussed the standard role of PET scan in staging and the pros/cons of this study, which is optional.  She and her son would like to get a PET before starting treatment, in part due to unexplained weight loss, as well as to rule out more advanced staging.  Assuming her clinical stage remains Stage I, I recommend SBRT to the RLL tumor. We discussed the risks, benefits, and side effects of radiotherapy. Side effects may include but not necessarily be limited to: injury to lung or other thoracic tissues; fatigue. No guarantees of treatment were given. A consent form was signed and placed in the patient's medical record. The patient was encouraged to ask questions that I answered to the best of my ability.  She and her son are happy with our plan.  She will be scheduled to see me for CT simulation/SBRT planning after PET.  On date  of service, in total, I spent 60 minutes on this encounter. Patient was seen in person. Note signed after encounter date; minutes pertain to date of service, only.    __________________________________________   Martha Peak, MD  This document serves as a record of services personally performed by Martha Peak, MD. It was created on her behalf by Neena Rhymes, a trained medical scribe. The creation of this record is based on the scribe's personal observations and the provider's statements to them. This document has been checked and approved by the attending provider.

## 2022-07-02 ENCOUNTER — Ambulatory Visit
Admission: RE | Admit: 2022-07-02 | Discharge: 2022-07-02 | Disposition: A | Discharge status: Home / Self Care | Payer: Medicare Other | Source: Ambulatory Visit | Attending: Radiation Oncology | Admitting: Radiation Oncology

## 2022-07-02 DIAGNOSIS — F17210 Nicotine dependence, cigarettes, uncomplicated: Secondary | ICD-10-CM | POA: Diagnosis not present

## 2022-07-02 DIAGNOSIS — C3431 Malignant neoplasm of lower lobe, right bronchus or lung: Secondary | ICD-10-CM

## 2022-07-02 NOTE — Progress Notes (Signed)
The proposed treatment discussed in conference is for discussion purpose only and is not a binding recommendation.  The patients have not been physically examined, or presented with their treatment options.  Therefore, final treatment plans cannot be decided.  

## 2022-07-03 DIAGNOSIS — C3490 Malignant neoplasm of unspecified part of unspecified bronchus or lung: Secondary | ICD-10-CM

## 2022-07-03 DIAGNOSIS — C3431 Malignant neoplasm of lower lobe, right bronchus or lung: Secondary | ICD-10-CM | POA: Insufficient documentation

## 2022-07-03 HISTORY — DX: Malignant neoplasm of unspecified part of unspecified bronchus or lung: C34.90

## 2022-07-09 ENCOUNTER — Telehealth: Payer: Self-pay | Admitting: *Deleted

## 2022-07-09 NOTE — Telephone Encounter (Signed)
Called patient to inform of Pet Scan on 07-15-22- arrival time- 9:30 am @ [facility], patient to have water only- 6 hrs. prior to test, patient to avoid carbs and sugars the day before exam, spoke with patient's son- Darryl and he is aware of this scan and the instructions

## 2022-07-15 ENCOUNTER — Ambulatory Visit (HOSPITAL_COMMUNITY)
Admission: RE | Admit: 2022-07-15 | Discharge: 2022-07-15 | Disposition: A | Discharge status: Home / Self Care | Payer: Medicare Other | Source: Ambulatory Visit | Attending: Radiation Oncology | Admitting: Radiation Oncology

## 2022-07-15 ENCOUNTER — Encounter (HOSPITAL_COMMUNITY): Payer: Medicare Other

## 2022-07-15 DIAGNOSIS — C3431 Malignant neoplasm of lower lobe, right bronchus or lung: Secondary | ICD-10-CM | POA: Insufficient documentation

## 2022-07-15 DIAGNOSIS — R918 Other nonspecific abnormal finding of lung field: Secondary | ICD-10-CM | POA: Diagnosis not present

## 2022-07-15 LAB — GLUCOSE, CAPILLARY: Glucose-Capillary: 94 mg/dL (ref 70–99)

## 2022-07-15 MED ORDER — FLUDEOXYGLUCOSE F - 18 (FDG) INJECTION
6.5000 | Freq: Once | INTRAVENOUS | Status: AC
  Administered 2022-07-15: 6.5 via INTRAVENOUS

## 2022-07-19 ENCOUNTER — Ambulatory Visit
Admission: RE | Admit: 2022-07-19 | Discharge: 2022-07-19 | Disposition: A | Discharge status: Home / Self Care | Payer: Medicare Other | Source: Ambulatory Visit | Attending: Radiation Oncology | Admitting: Radiation Oncology

## 2022-07-19 ENCOUNTER — Encounter: Payer: Self-pay | Admitting: Radiation Oncology

## 2022-07-19 ENCOUNTER — Other Ambulatory Visit: Payer: Self-pay

## 2022-07-19 DIAGNOSIS — C3431 Malignant neoplasm of lower lobe, right bronchus or lung: Secondary | ICD-10-CM | POA: Diagnosis not present

## 2022-07-19 DIAGNOSIS — Z510 Encounter for antineoplastic radiation therapy: Secondary | ICD-10-CM | POA: Diagnosis not present

## 2022-07-19 DIAGNOSIS — K8689 Other specified diseases of pancreas: Secondary | ICD-10-CM

## 2022-07-19 DIAGNOSIS — F17210 Nicotine dependence, cigarettes, uncomplicated: Secondary | ICD-10-CM | POA: Diagnosis not present

## 2022-07-20 ENCOUNTER — Other Ambulatory Visit: Payer: Self-pay

## 2022-07-20 ENCOUNTER — Other Ambulatory Visit: Payer: Self-pay | Admitting: Radiation Oncology

## 2022-07-20 ENCOUNTER — Telehealth: Payer: Self-pay

## 2022-07-20 DIAGNOSIS — Z510 Encounter for antineoplastic radiation therapy: Secondary | ICD-10-CM | POA: Diagnosis not present

## 2022-07-20 DIAGNOSIS — K8689 Other specified diseases of pancreas: Secondary | ICD-10-CM

## 2022-07-20 DIAGNOSIS — C3431 Malignant neoplasm of lower lobe, right bronchus or lung: Secondary | ICD-10-CM

## 2022-07-20 DIAGNOSIS — F17210 Nicotine dependence, cigarettes, uncomplicated: Secondary | ICD-10-CM | POA: Diagnosis not present

## 2022-07-20 DIAGNOSIS — R1900 Intra-abdominal and pelvic swelling, mass and lump, unspecified site: Secondary | ICD-10-CM

## 2022-07-20 NOTE — Progress Notes (Signed)
I personally reviewed Ms Bywater's PET scan and discussed it with Dr. Tonia Brooms and the patient and her son.  Our consensus is to proceed with SBRT to the known cancer in her right lower lung.  Will order an MRI to inspect her pancreas. Also, re: the small nonspecific hilar lymph node on her PET, I will monitor that closely. Will order a restaging PET to be done in the late October and f/u with me 3 days after that scan.  -----------------------------------  Lonie Peak, MD

## 2022-07-20 NOTE — Telephone Encounter (Signed)
Rn called pt son Darryl to update him on plan of care for his mom. He was informed that an Mri has been ordered to look at her pancreas. He was also informed that she will have a follow up pet scan and visit  with Dr. Basilio Cairo in October (we are monitoring a small lymph node finding for his mom). He had no questions about this information and will pass it along to his family.

## 2022-07-20 NOTE — Progress Notes (Signed)
PET scan ordered for October 22nd with follow up in 3 days to follow with Dr. Basilio Cairo.

## 2022-07-21 ENCOUNTER — Telehealth: Payer: Self-pay | Admitting: *Deleted

## 2022-07-21 NOTE — Telephone Encounter (Signed)
Called patient's son- Darryl Day to inform of MRI for his mom on 07-27-22- arrival time- 6:45 am @ [facility], patient to be NPO- 4 hrs, prior to test, lvm for a return call

## 2022-07-23 ENCOUNTER — Ambulatory Visit: Payer: Self-pay | Admitting: Radiation Oncology

## 2022-07-27 ENCOUNTER — Telehealth: Payer: Self-pay

## 2022-07-27 ENCOUNTER — Other Ambulatory Visit: Payer: Self-pay | Admitting: Radiation Oncology

## 2022-07-27 ENCOUNTER — Ambulatory Visit (HOSPITAL_COMMUNITY)
Admission: RE | Admit: 2022-07-27 | Discharge: 2022-07-27 | Disposition: A | Discharge status: Home / Self Care | Payer: Medicare Other | Source: Ambulatory Visit | Attending: Radiation Oncology | Admitting: Radiation Oncology

## 2022-07-27 DIAGNOSIS — K8689 Other specified diseases of pancreas: Secondary | ICD-10-CM | POA: Diagnosis not present

## 2022-07-27 DIAGNOSIS — R935 Abnormal findings on diagnostic imaging of other abdominal regions, including retroperitoneum: Secondary | ICD-10-CM | POA: Diagnosis not present

## 2022-07-27 DIAGNOSIS — K5730 Diverticulosis of large intestine without perforation or abscess without bleeding: Secondary | ICD-10-CM | POA: Diagnosis not present

## 2022-07-27 MED ORDER — GADOBUTROL 1 MMOL/ML IV SOLN
6.0000 mL | Freq: Once | INTRAVENOUS | Status: AC | PRN
  Administered 2022-07-27: 6 mL via INTRAVENOUS

## 2022-07-27 NOTE — Telephone Encounter (Signed)
Rn called pt to give her reassuring MRI results per Dr. Basilio Cairo request. Pt was grateful and very happy with update. She wanted to thank everyone for their kindness and love for her.

## 2022-07-29 ENCOUNTER — Ambulatory Visit: Payer: Medicare Other | Attending: Cardiology | Admitting: *Deleted

## 2022-07-29 DIAGNOSIS — I4892 Unspecified atrial flutter: Secondary | ICD-10-CM | POA: Insufficient documentation

## 2022-07-29 DIAGNOSIS — Z5181 Encounter for therapeutic drug level monitoring: Secondary | ICD-10-CM | POA: Diagnosis not present

## 2022-07-29 LAB — POCT INR: INR: 2.2 (ref 2.0–3.0)

## 2022-07-29 NOTE — Patient Instructions (Signed)
Description   Continue taking warfarin 1.5 tablets of warfarin daily except for 2 tablets on Sundays. Recheck INR in 5 weeks. [facility] [phone] or [phone]

## 2022-08-08 IMAGING — MG MM DIGITAL SCREENING BILAT W/ TOMO AND CAD
6 of 10 series · 6 of 30 positions shown · non-contrast
Comparison: Previous exam(s).

CLINICAL DATA: Screening.

EXAM:
DIGITAL SCREENING BILATERAL MAMMOGRAM WITH TOMOSYNTHESIS AND CAD
TECHNIQUE: Bilateral screening digital craniocaudal and mediolateral oblique
mammograms were obtained. Bilateral screening digital breast
tomosynthesis was performed. The images were evaluated with
computer-aided detection.

[L CC synth-2D (1 of 2)]
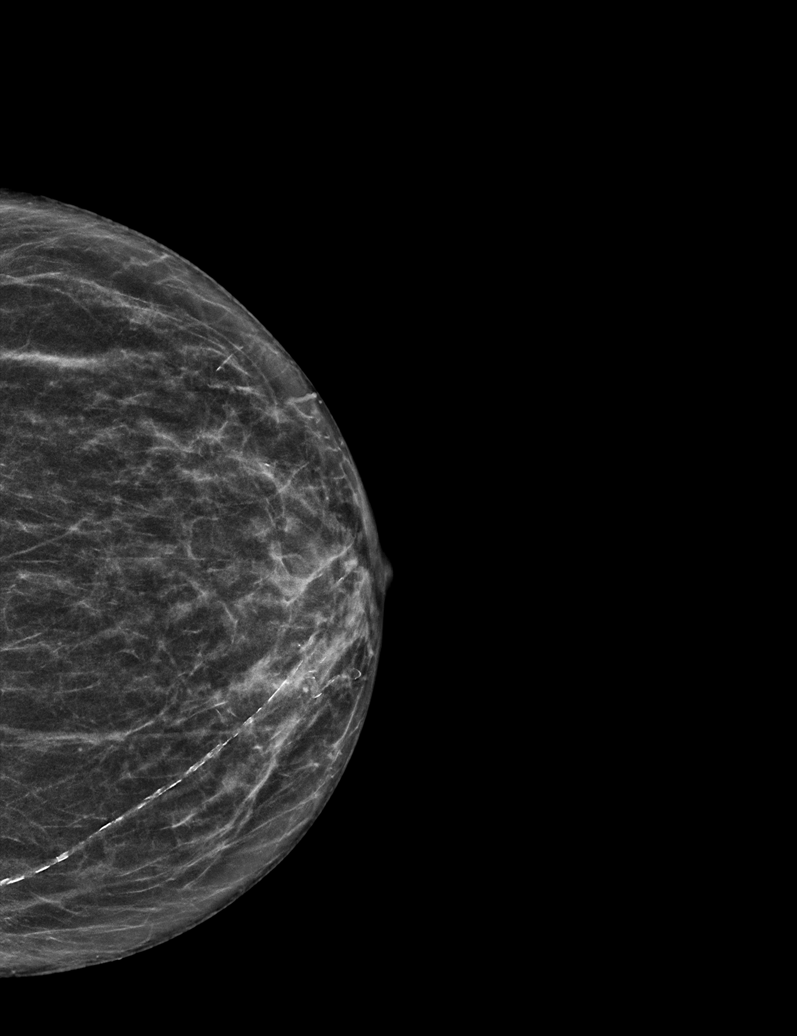

[L CC synth-2D (2 of 2)]
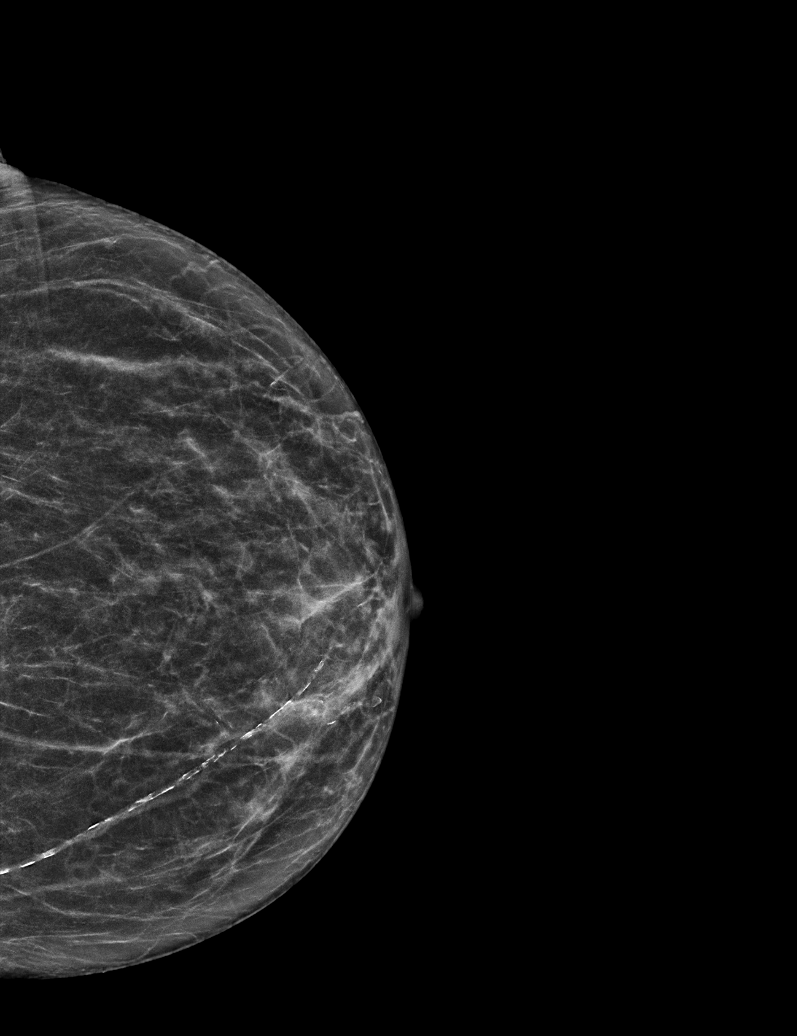

[R MLO synth-2D]
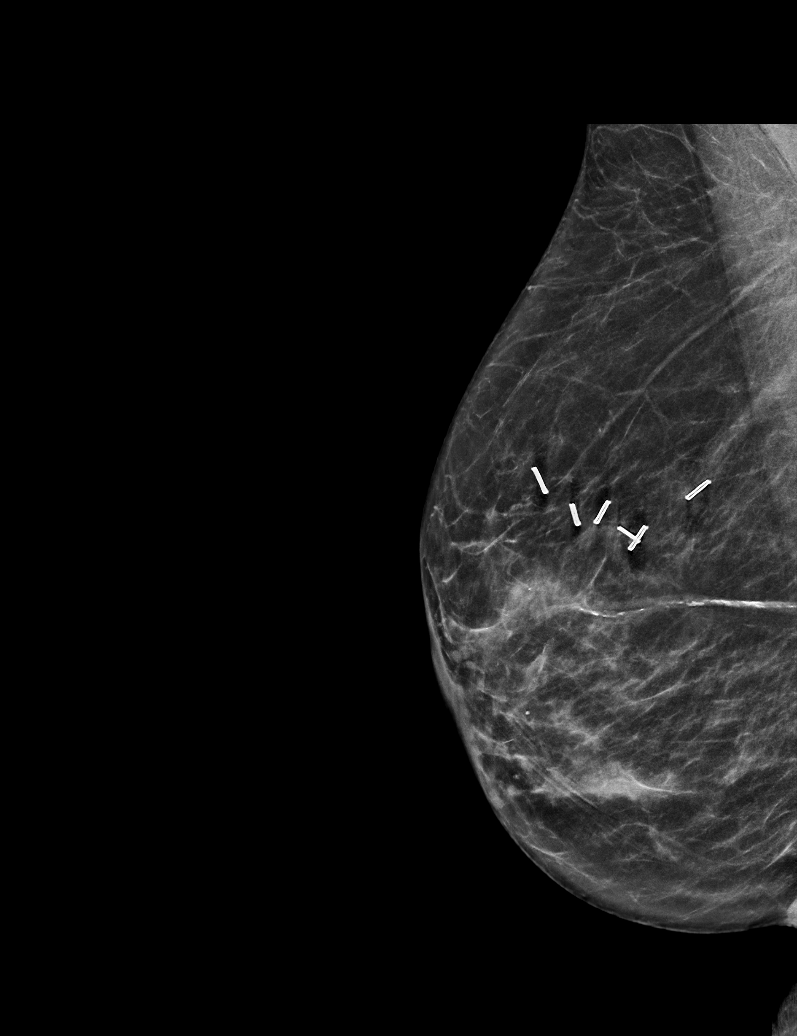

[L MLO synth-2D]
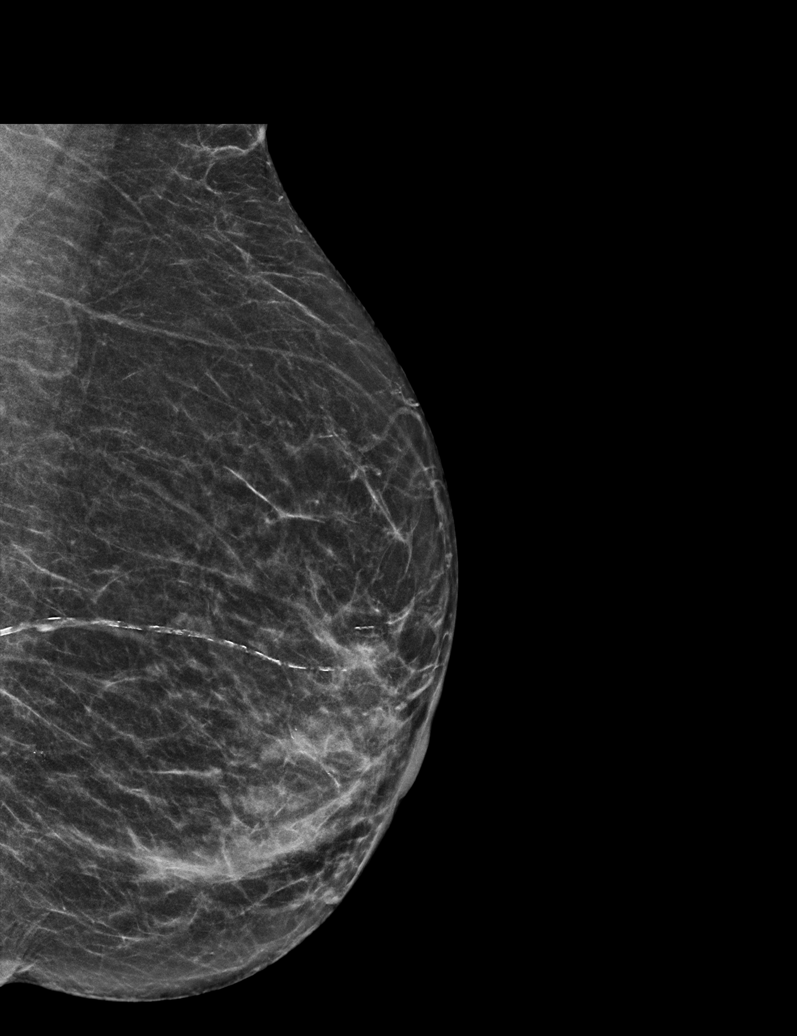

[R CC synth-2D]
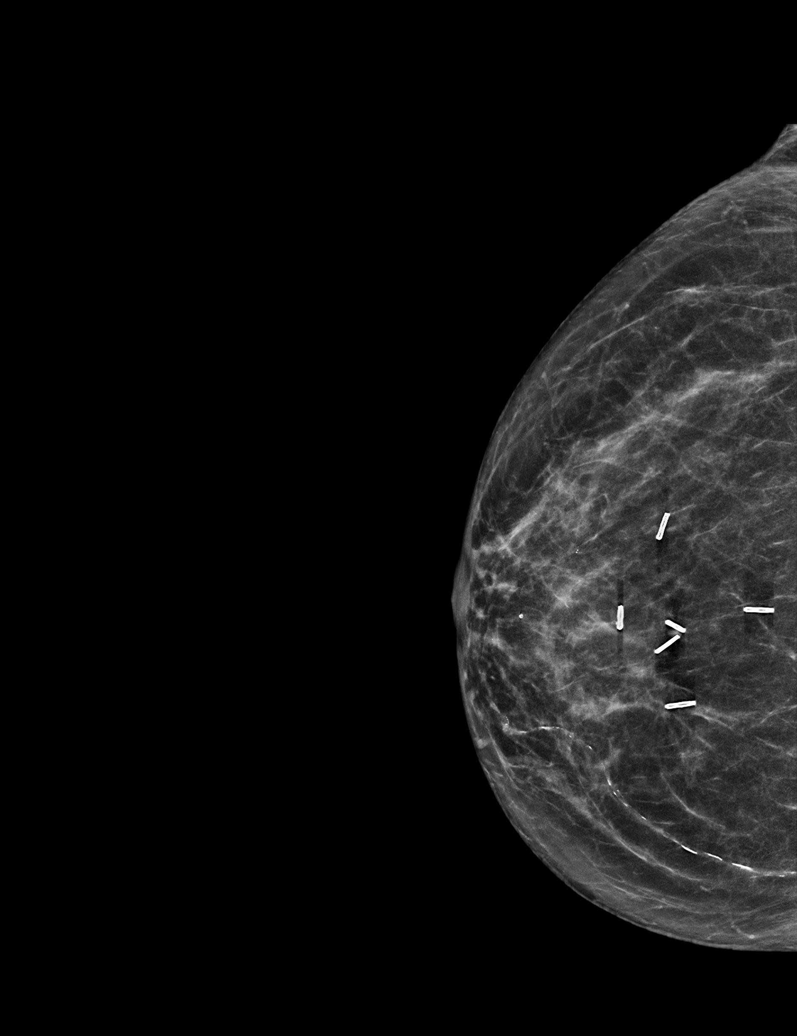

[L MLO tomo · tomo slice 25/48.0]
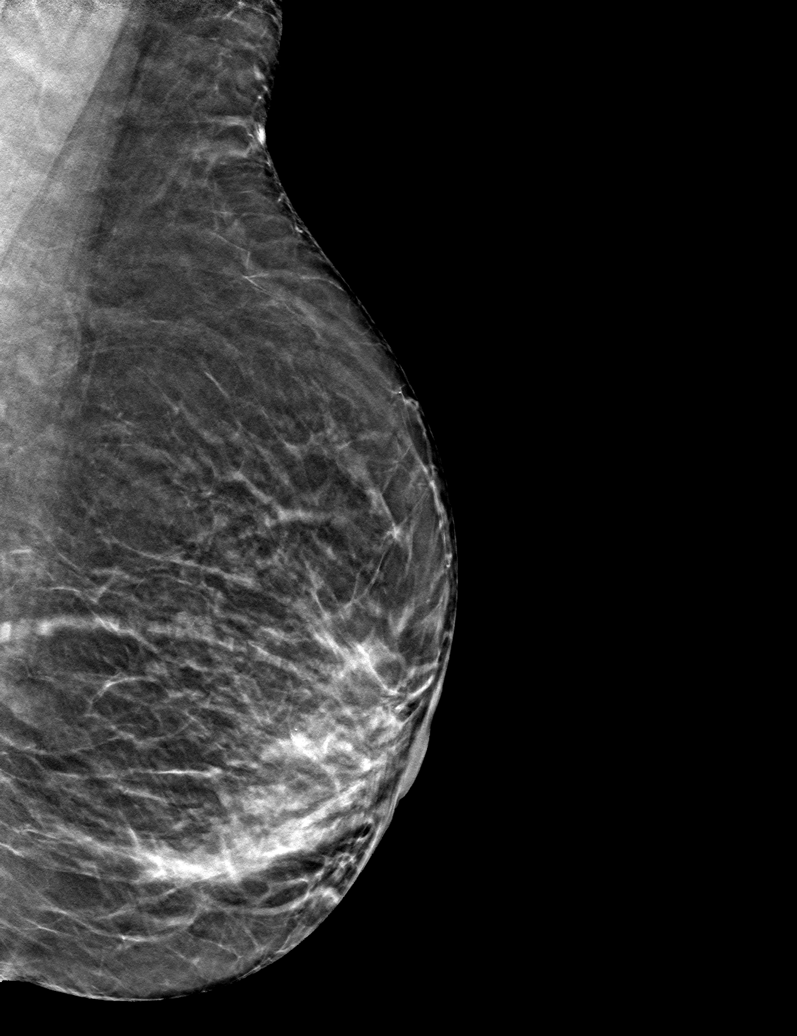

[6 of 30 positions shown; findings below may reference images not displayed]

ACR Breast Density Category b: There are scattered areas of
fibroglandular density.
FINDINGS: There are no findings suspicious for malignancy.
IMPRESSION: No mammographic evidence of malignancy. A result letter of this
screening mammogram will be mailed directly to the patient.

RECOMMENDATION:
Screening mammogram in one year. (Code:51-O-LD2)

BI-RADS CATEGORY  1: Negative.

## 2022-08-09 ENCOUNTER — Ambulatory Visit: Payer: Medicare Other | Admitting: Cardiovascular Disease

## 2022-08-09 ENCOUNTER — Ambulatory Visit: Payer: Medicare Other | Attending: Cardiovascular Disease | Admitting: Cardiovascular Disease

## 2022-08-09 ENCOUNTER — Encounter: Payer: Self-pay | Admitting: Cardiovascular Disease

## 2022-08-09 VITALS — BP 126/68 | HR 91 | Ht 67.5 in | Wt 138.0 lb

## 2022-08-09 DIAGNOSIS — I350 Nonrheumatic aortic (valve) stenosis: Secondary | ICD-10-CM | POA: Insufficient documentation

## 2022-08-09 DIAGNOSIS — Z7901 Long term (current) use of anticoagulants: Secondary | ICD-10-CM | POA: Insufficient documentation

## 2022-08-09 DIAGNOSIS — I480 Paroxysmal atrial fibrillation: Secondary | ICD-10-CM | POA: Diagnosis not present

## 2022-08-09 DIAGNOSIS — I10 Essential (primary) hypertension: Secondary | ICD-10-CM | POA: Diagnosis not present

## 2022-08-09 NOTE — Patient Instructions (Signed)
Medication Instructions:  Your physician recommends that you continue on your current medications as directed. Please refer to the Current Medication list given to you today.  *If you need a refill on your cardiac medications before your next appointment, please call your pharmacy*   Lab Work: NONE If you have labs (blood work) drawn today and your tests are completely normal, you will receive your results only by: MyChart Message (if you have MyChart) OR A paper copy in the mail If you have any lab test that is abnormal or we need to change your treatment, we will call you to review the results.   Testing/Procedures: ECHO Your physician has requested that you have an echocardiogram. Echocardiography is a painless test that uses sound waves to create images of your heart. It provides your doctor with information about the size and shape of your heart and how well your heart's chambers and valves are working. This procedure takes approximately one hour. There are no restrictions for this procedure. Please do NOT wear cologne, perfume, aftershave, or lotions (deodorant is allowed). Please arrive 15 minutes prior to your appointment time.  Follow-Up: At [facility], you and your health needs are our priority.  As part of our continuing mission to provide you with exceptional heart care, we have created designated Provider Care Teams.  These Care Teams include your primary Cardiologist (physician) and Advanced Practice Providers (APPs -  Physician Assistants and Nurse Practitioners) who all work together to provide you with the care you need, when you need it.  Your next appointment:   6 month(s)  Provider:   Michael Cooper, MD   

## 2022-08-09 NOTE — Progress Notes (Signed)
Cardiology Office Note:    Date:  08/09/2022   ID:  Martha Edwards, DOB 05-Dec-1932, MRN [id number]  PCP:  [name], MD   [facility] Providers Cardiologist:  None     Referring MD: [name], MD   Chief Complaint  Patient presents with   Aortic Stenosis    History of Present Illness:    Martha Edwards is a 89 y.o. female with a hx of  tachybradycardia syndrome, PAF, hypertension, aortic valve disease with stenosis and regurgitation, and chronic anticoagulation therapy.   The patient has been followed by Dr. Katrinka Blazing and I will assume her cardiac care and his retirement.  This is my first meeting with her.  The patient still lives independently but she has family living locally in town.  She reports no significant functional limitation with her normal daily activities such as light housework or walking on level ground.  She does have mild shortness of breath with physical activity.  No chest pain or pressure.  No edema, orthopnea, PND, or heart palpitations.  The patient had an echocardiogram in October 2023 that raised concern for moderately severe, paradoxical low-flow low gradient aortic stenosis.  She was seen back in follow-up by Dr. Katrinka Blazing to discuss treatment options.  She was felt to be clinically stable and she presents today for further cardiac evaluation.  Since that time, she has been diagnosed with non-small cell lung cancer and she is beginning radiation therapy under the care of Dr. Basilio Cairo.  Her notes are reviewed today.  Past Medical History:  Diagnosis Date   Anticoagulated    Anxiety    Arthritis of hand    Atrial flutter (HCC)    with LVEF 60%, LVH, LAE and holter with rate control and Ave HR 60 bpm    Breast cancer (HCC) 2017    RIGHT BREAST CA   Carotid artery plaque    mild bilateral carotid plaque without significant stenosis (<20% in left ICA), 04/2010   CKD (chronic kidney disease) stage 2, GFR 60-89 ml/min    Pt states she does have this    Diverticula, colon    Dyspnea    Family history of breast cancer    Family history of prostate cancer    Hypercholesterolemia    Hypertension    with LVH on echo 06/03/11   Hypertonicity of bladder    Insomnia    Mitral regurgitation    Osteopenia    > osteoporosis   Overactive bladder     Past Surgical History:  Procedure Laterality Date   BREAST BIOPSY     BREAST LUMPECTOMY Right    2017   BREAST LUMPECTOMY WITH RADIOACTIVE SEED LOCALIZATION Right 04/07/2015   Procedure: RIGHT BREAST LUMPECTOMY WITH RADIOACTIVE SEED LOCALIZATION;  Surgeon: Glenna Fellows, MD;  Location: [facility];  Service: General;  Laterality: Right;   BRONCHIAL BIOPSY  06/22/2022   Procedure: BRONCHIAL BIOPSIES;  Surgeon: [name], DO;  Location: [facility];  Service: Pulmonary;;   BRONCHIAL NEEDLE ASPIRATION BIOPSY  06/22/2022   Procedure: BRONCHIAL NEEDLE ASPIRATION BIOPSIES;  Surgeon: [name], DO;  Location: [facility];  Service: Pulmonary;;   CATARACT EXTRACTION W/ INTRAOCULAR LENS  IMPLANT, BILATERAL Bilateral    2017-2018   COLONOSCOPY  2001   2007 sigmoidoscopy     Current Medications: Current Meds  Medication Sig   acetaminophen (TYLENOL) 325 MG tablet Take 650 mg by mouth every 6 (six) hours as needed for mild pain.  amLODipine (NORVASC) 5 MG tablet Take 1 tablet (5 mg total) by mouth daily. (Patient taking differently: Take 5 mg by mouth daily. Takes in the afternoon per patient)   atorvastatin (LIPITOR) 40 MG tablet Take 40 mg by mouth daily.   CALCIUM PO Take 1 tablet by mouth 2 (two) times daily.   Cholecalciferol (VITAMIN D3) 25 MCG (1000 UT) CAPS Take 1,000 Units by mouth daily.   Denosumab (PROLIA [place]) Take as directed once every 6 months.   mirtazapine (REMERON) 30 MG tablet Take 60 mg by mouth at bedtime.   warfarin (COUMADIN) 4 MG tablet TAKE AS DIRECTED BY COUMADIN CLINIC **90 DAY SUPPLY (Patient taking differently: Take 4 mg by mouth See admin  instructions. Take 6 mg daily at 1400<EXCEPT "Sunday took 8 mg  TAKE AS DIRECTED BY COUMADIN CLINIC **90 DAY SUPPLY)   Zoster Vaccine Adjuvanted (SHINGRIX) injection Inject into the muscle.     Allergies:   Patient has no known allergies.   Social History   Socioeconomic History   Marital status: Single    Spouse name: Not on file   Number of children: Not on file   Years of education: Not on file   Highest education level: Not on file  Occupational History   Not on file  Tobacco Use   Smoking status: Former    Types: Cigarettes    Quit date: 02/02/1984    Years since quitting: 38.5   Smokeless tobacco: Never  Substance and Sexual Activity   Alcohol use: Not Currently   Drug use: Not on file   Sexual activity: Never  Other Topics Concern   Not on file  Social History Narrative   Not on file   Social Determinants of Health   Financial Resource Strain: Not on file  Food Insecurity: No Food Insecurity (07/01/2022)   Hunger Vital Sign    Worried About Running Out of Food in the Last Year: Never true    Ran Out of Food in the Last Year: Never true  Transportation Needs: No Transportation Needs (07/01/2022)   PRAPARE - Transportation    Lack of Transportation (Medical): No    Lack of Transportation (Non-Medical): No  Physical Activity: Not on file  Stress: Not on file  Social Connections: Not on file     Family History: The patient's family history includes Breast cancer (age of onset: 65) in her sister; Diabetes in her mother; Liver cancer in her mother; Prostate cancer (age of onset: 70) in her brother. There is no history of Heart attack or Stroke.  ROS:   Please see the history of present illness.    All other systems reviewed and are negative.  EKGs/Labs/Other Studies Reviewed:    The following studies were reviewed today: Cardiac Studies & Procedures       ECHOCARDIOGRAM  ECHOCARDIOGRAM COMPLETE 11/23/2021  Narrative ECHOCARDIOGRAM REPORT    Patient  Name:   Martha Edwards Date of Exam: 11/23/2021 Medical Rec #:  [id number]      Height:       69.0 in Accession #:    [id number]     Weight:       140.6 lb Date of Birth:  05/08/1932      BSA:          1.779 m Patient Age:    89 years       BP:           14" 6/80 mmHg Patient Gender: F  HR:           54 bpm. Exam Location:  [address]  Procedure: 2D Echo, Cardiac Doppler, Color Doppler and Strain Analysis  Indications:    I35.0 Aortic Stenosis  History:        Patient has prior history of Echocardiogram examinations, most recent 02/21/2020. Breast Cancer, CKD, Arrythmias:Atrial Fibrillation and Tachybradycardia; Risk Factors:Hypertension.  Sonographer:    Clearence Ped RCS Referring Phys: [phone] [facility] Dien[facility]  IMPRESSIONS   1. Left ventricular ejection fraction, by estimation, is 60 to 65%. The left ventricle has normal function. The left ventricle has no regional wall motion abnormalities. There is mild left ventricular hypertrophy. Left ventricular diastolic parameters are consistent with Grade II diastolic dysfunction (pseudonormalization). Elevated left atrial pressure. The average left ventricular global longitudinal strain is -26.2 %. 2. Right ventricular systolic function is normal. The right ventricular size is normal. There is mildly elevated pulmonary artery systolic pressure. The estimated right ventricular systolic pressure is 42.4 mmHg. 3. Left atrial size was mildly dilated. 4. The mitral valve is abnormal. Thickened leaflets. Mild mitral valve regurgitation. Mild mitral stenosis. The mean mitral valve gradient is 4.0 mmHg with average heart rate of 50 bpm. 5. Tricuspid valve regurgitation is mild to moderate. 6. The inferior vena cava is normal in size with greater than 50% respiratory variability, suggesting right atrial pressure of 3 mmHg. 7. The aortic valve is calcified. There is severe calcification of the aortic valve. Aortic valve regurgitation is mild.  Moderate to severe aortic valve stenosis. Vmax 3.0, MG 21 mmHg, AVA 0.7 cm^2, DI 0.27. Low SV index (30 cc/m^2), suspect paradoxical low flow low gradient severe AS  FINDINGS Left Ventricle: Left ventricular ejection fraction, by estimation, is 60 to 65%. The left ventricle has normal function. The left ventricle has no regional wall motion abnormalities. The average left ventricular global longitudinal strain is -26.2 %. The left ventricular internal cavity size was normal in size. There is mild left ventricular hypertrophy. Left ventricular diastolic parameters are consistent with Grade II diastolic dysfunction (pseudonormalization). Elevated left atrial pressure.  Right Ventricle: The right ventricular size is normal. No increase in right ventricular wall thickness. Right ventricular systolic function is normal. There is mildly elevated pulmonary artery systolic pressure. The tricuspid regurgitant velocity is 3.14 m/s, and with an assumed right atrial pressure of 3 mmHg, the estimated right ventricular systolic pressure is 42.4 mmHg.  Left Atrium: Left atrial size was mildly dilated.  Right Atrium: Right atrial size was normal in size.  Pericardium: Trivial pericardial effusion is present.  Mitral Valve: The mitral valve is abnormal. Mild mitral valve regurgitation. No evidence of mitral valve stenosis. The mean mitral valve gradient is 4.0 mmHg with average heart rate of 50 bpm.  Tricuspid Valve: The tricuspid valve is normal in structure. Tricuspid valve regurgitation is mild to moderate.  Aortic Valve: The aortic valve is calcified. There is severe calcifcation of the aortic valve. Aortic valve regurgitation is mild. Aortic regurgitation PHT measures 568 msec. Moderate to severe aortic stenosis is present. Aortic valve mean gradient measures 21.0 mmHg. Aortic valve peak gradient measures 36.0 mmHg. Aortic valve area, by VTI measures 0.69 cm.  Pulmonic Valve: The pulmonic valve was  grossly normal. Pulmonic valve regurgitation is mild.  Aorta: The aortic root and ascending aorta are structurally normal, with no evidence of dilitation.  Venous: The inferior vena cava is normal in size with greater than 50% respiratory variability, suggesting right atrial pressure of 3 mmHg.  IAS/Shunts: The interatrial septum was not well visualized.   LEFT VENTRICLE PLAX 2D LVIDd:         4.05 cm   Diastology LVIDs:         2.30 cm   LV e' medial:    5.00 cm/s LV PW:         0.80 cm   LV E/e' medial:  21.2 LV IVS:        1.00 cm   LV e' lateral:   8.81 cm/s LVOT diam:     1.80 cm   LV E/e' lateral: 12.0 LV SV:         54 LV SV Index:   31        2D Longitudinal Strain LVOT Area:     2.54 cm  2D Strain GLS (A2C):   -26.2 % 2D Strain GLS (A3C):   -23.4 % 2D Strain GLS (A4C):   -29.2 % 2D Strain GLS Avg:     -26.2 %  RIGHT VENTRICLE RV Basal diam:  3.10 cm RV S prime:     12.20 cm/s TAPSE (M-mode): 2.6 cm RVSP:           42.4 mmHg  LEFT ATRIUM             Index        RIGHT ATRIUM           Index LA diam:        3.20 cm 1.80 cm/m   RA Pressure: 3.00 mmHg LA Vol (A2C):   81.4 ml 45.77 ml/m  RA Area:     17.40 cm LA Vol (A4C):   50.1 ml 28.17 ml/m  RA Volume:   46.20 ml  25.97 ml/m LA Biplane Vol: 66.9 ml 37.61 ml/m AORTIC VALVE AV Area (Vmax):    0.81 cm AV Area (Vmean):   0.82 cm AV Area (VTI):     0.69 cm AV Vmax:           300.00 cm/s AV Vmean:          217.000 cm/s AV VTI:            0.785 m AV Peak Grad:      36.0 mmHg AV Mean Grad:      21.0 mmHg LVOT Vmax:         95.80 cm/s LVOT Vmean:        69.500 cm/s LVOT VTI:          0.214 m LVOT/AV VTI ratio: 0.27 AI PHT:            568 msec  AORTA Ao Root diam: 3.00 cm Ao Asc diam:  2.40 cm  MITRAL VALVE                TRICUSPID VALVE MV Area (PHT):              TR Peak grad:   39.4 mmHg MV Mean grad:  4.0 mmHg     TR Vmax:        314.00 cm/s MV Decel Time:              Estimated RAP:  3.00 mmHg MV E  velocity: 106.00 cm/s  RVSP:           42.4 mmHg MV A velocity: 147.00 cm/s MV E/A ratio:  0.72         SHUNTS Systemic VTI:  0.21 m Systemic Diam: 1.80 cm  Epifanio Lesches MD Electronically  signed by Epifanio Lesches MD Signature Date/Time: 11/23/2021/1:00:19 PM    Final                  Recent Labs: 06/22/2022: BUN 15; Creatinine, Ser 1.26; Hemoglobin 13.5; Platelets 194; Potassium 4.2; Sodium 140  Recent Lipid Panel No results found for: "CHOL", "TRIG", "HDL", "CHOLHDL", "VLDL", "LDLCALC", "LDLDIRECT"   Risk Assessment/Calculations:    CHA2DS2-VASc Score = 5   This indicates a 7.2% annual risk of stroke. The patient's score is based upon: CHF History: 0 HTN History: 1 Diabetes History: 0 Stroke History: 0 Vascular Disease History: 1 Age Score: 2 Gender Score: 1               Physical Exam:    VS:  BP 126/68   Pulse 91   Ht 5' 7.5" (1.715 m)   Wt 138 lb (62.6 kg)   SpO2 98%   BMI 21.29 kg/m     Wt Readings from Last 3 Encounters:  08/09/22 138 lb (62.6 kg)  06/29/22 138 lb 9.6 oz (62.9 kg)  06/22/22 135 lb (61.2 kg)     GEN:  Well nourished, well developed pleasant elderly woman in no acute distress HEENT: Normal NECK: No JVD; No carotid bruits LYMPHATICS: No lymphadenopathy CARDIAC: Irregularly irregular, 2/6 harsh late peaking crescendo decrescendo murmur at the right upper sternal border RESPIRATORY:  Clear to auscultation without rales, wheezing or rhonchi  ABDOMEN: Soft, non-tender, non-distended MUSCULOSKELETAL:  No edema; No deformity  SKIN: Warm and dry NEUROLOGIC:  Alert and oriented x 3 PSYCHIATRIC:  Normal affect   ASSESSMENT:    1. Nonrheumatic aortic valve stenosis   2. Paroxysmal atrial fibrillation (HCC)   3. Chronic anticoagulation   4. Essential hypertension    PLAN:    In order of problems listed above:  The patient appears clinically stable.  With her newly diagnosed lung cancer and treatment with radiation  therapy, I am inclined to follow her aortic stenosis with surveillance echo studies.  Will arrange an echo to be completed in October for a 83-month follow-up.  She does not appear to have any progressive symptoms at this time and she is not limited with any of her normal activities.  We discussed the natural history of aortic stenosis and considerations around aortic valve replacement if she progresses in the future.  At her age, she would clearly be better suited for TAVR if intervention is needed.  Hopefully we can manage her conservatively and we will continue to follow her as above.  I will plan to see her back after her echo is completed. Tolerating anticoagulation with warfarin.  Denies bleeding problems. Continue warfarin.  States that she has done well on this for many years. Blood pressure is well-controlled on amlodipine alone.           Medication Adjustments/Labs and Tests Ordered: Current medicines are reviewed at length with the patient today.  Concerns regarding medicines are outlined above.  Orders Placed This Encounter  Procedures   ECHOCARDIOGRAM COMPLETE   No orders of the defined types were placed in this encounter.   Patient Instructions  Medication Instructions:  Your physician recommends that you continue on your current medications as directed. Please refer to the Current Medication list given to you today.  *If you need a refill on your cardiac medications before your next appointment, please call your pharmacy*  Lab Work: NONE If you have labs (blood work) drawn today and your tests are completely normal, you  will receive your results only by: MyChart Message (if you have MyChart) OR A paper copy in the mail If you have any lab test that is abnormal or we need to change your treatment, we will call you to review the results.  Testing/Procedures: ECHO Your physician has requested that you have an echocardiogram. Echocardiography is a painless test that uses  sound waves to create images of your heart. It provides your doctor with information about the size and shape of your heart and how well your heart's chambers and valves are working. This procedure takes approximately one hour. There are no restrictions for this procedure. Please do NOT wear cologne, perfume, aftershave, or lotions (deodorant is allowed). Please arrive 15 minutes prior to your appointment time.  Follow-Up: At [facility], you and your health needs are our priority.  As part of our continuing mission to provide you with exceptional heart care, we have created designated Provider Care Teams.  These Care Teams include your primary Cardiologist (physician) and Advanced Practice Providers (APPs -  Physician Assistants and Nurse Practitioners) who all work together to provide you with the care you need, when you need it.  Your next appointment:   6 month(s)  Provider:   Tonny Bollman, MD     Signed, Tonny Bollman, MD  08/09/2022 4:19 PM    [facility]

## 2022-08-11 ENCOUNTER — Other Ambulatory Visit: Payer: Self-pay

## 2022-08-11 ENCOUNTER — Ambulatory Visit
Admission: RE | Admit: 2022-08-11 | Discharge: 2022-08-11 | Disposition: A | Discharge status: Home / Self Care | Payer: Medicare Other | Source: Ambulatory Visit | Attending: Radiation Oncology | Admitting: Radiation Oncology

## 2022-08-11 DIAGNOSIS — C3431 Malignant neoplasm of lower lobe, right bronchus or lung: Secondary | ICD-10-CM | POA: Diagnosis not present

## 2022-08-11 DIAGNOSIS — Z510 Encounter for antineoplastic radiation therapy: Secondary | ICD-10-CM | POA: Diagnosis not present

## 2022-08-11 LAB — RAD ONC ARIA SESSION SUMMARY
Course Elapsed Days: 0
Plan Fractions Treated to Date: 1
Plan Prescribed Dose Per Fraction: 18 Gy
Plan Total Fractions Prescribed: 3
Plan Total Prescribed Dose: 54 Gy
Reference Point Dosage Given to Date: 18 Gy
Reference Point Session Dosage Given: 18 Gy
Session Number: 1

## 2022-08-13 ENCOUNTER — Other Ambulatory Visit: Payer: Self-pay

## 2022-08-13 ENCOUNTER — Ambulatory Visit: Admission: RE | Admit: 2022-08-13 | Payer: Medicare Other | Source: Ambulatory Visit | Admitting: Radiation Oncology

## 2022-08-13 DIAGNOSIS — C3431 Malignant neoplasm of lower lobe, right bronchus or lung: Secondary | ICD-10-CM | POA: Diagnosis not present

## 2022-08-13 DIAGNOSIS — Z510 Encounter for antineoplastic radiation therapy: Secondary | ICD-10-CM | POA: Diagnosis not present

## 2022-08-13 LAB — RAD ONC ARIA SESSION SUMMARY
Course Elapsed Days: 2
Plan Fractions Treated to Date: 2
Plan Prescribed Dose Per Fraction: 18 Gy
Plan Total Fractions Prescribed: 3
Plan Total Prescribed Dose: 54 Gy
Reference Point Dosage Given to Date: 36 Gy
Reference Point Session Dosage Given: 18 Gy
Session Number: 2

## 2022-08-16 ENCOUNTER — Ambulatory Visit: Payer: Medicare Other

## 2022-08-16 ENCOUNTER — Ambulatory Visit: Payer: Medicare Other | Admitting: Radiation Oncology

## 2022-08-18 ENCOUNTER — Other Ambulatory Visit: Payer: Self-pay

## 2022-08-18 ENCOUNTER — Ambulatory Visit: Payer: Medicare Other

## 2022-08-18 ENCOUNTER — Ambulatory Visit
Admission: RE | Admit: 2022-08-18 | Discharge: 2022-08-18 | Disposition: A | Discharge status: Home / Self Care | Payer: Medicare Other | Source: Ambulatory Visit | Attending: Radiation Oncology | Admitting: Radiation Oncology

## 2022-08-18 DIAGNOSIS — C3431 Malignant neoplasm of lower lobe, right bronchus or lung: Secondary | ICD-10-CM | POA: Diagnosis not present

## 2022-08-18 DIAGNOSIS — F17210 Nicotine dependence, cigarettes, uncomplicated: Secondary | ICD-10-CM | POA: Diagnosis not present

## 2022-08-18 DIAGNOSIS — Z510 Encounter for antineoplastic radiation therapy: Secondary | ICD-10-CM | POA: Diagnosis not present

## 2022-08-18 LAB — RAD ONC ARIA SESSION SUMMARY
Course Elapsed Days: 7
Plan Fractions Treated to Date: 3
Plan Prescribed Dose Per Fraction: 18 Gy
Plan Total Fractions Prescribed: 3
Plan Total Prescribed Dose: 54 Gy
Reference Point Dosage Given to Date: 54 Gy
Reference Point Session Dosage Given: 18 Gy
Session Number: 3

## 2022-08-18 NOTE — Progress Notes (Signed)
Ct chest no contrast ordered for 3 month follow up with Dr. Basilio Cairo (per Dr. Basilio Cairo request).

## 2022-08-19 NOTE — Radiation Completion Notes (Signed)
Patient Name: Martha Edwards, Martha Edwards MRN: [id number] Date of Birth: 11-04-1932 Referring Physician: Audie Box, M.D. Date of Service: 2022-08-19 Radiation Oncologist: Lonie Peak, M.D. [facility]                             RADIATION ONCOLOGY END OF TREATMENT NOTE     Diagnosis: C34.31 Malignant neoplasm of lower lobe, right bronchus or lung Staging on 2015-05-16: Carcinoma of upper-inner quadrant of right female breast (HCC) T=T1c, N=N0, M=M0 Staging on 2015-04-07: Carcinoma of upper-inner quadrant of right female breast (HCC) T=T1c, N=NX, M=cM0 Staging on 2022-07-02: Malignant neoplasm of lower lobe of right lung (HCC) T=cT1b, N=cN0, M=cM0 Intent: Curative     ==========DELIVERED PLANS==========  First Treatment Date: 2022-08-11 - Last Treatment Date: 2022-08-18   Plan Name: Lung_RLL_SBRT Site: Lung, Right Technique: SBRT/SRT-IMRT Mode: Photon Dose Per Fraction: 18 Gy Prescribed Dose (Delivered / Prescribed): 54 Gy / 54 Gy Prescribed Fxs (Delivered / Prescribed): 3 / 3     ==========ON TREATMENT VISIT DATES========== 2022-08-11, 2022-08-13, 2022-08-18, 2022-08-18     ==========UPCOMING VISITS==========       ==========APPENDIX - ON TREATMENT VISIT NOTES==========   See weekly On Treatment Notes in Epic for details.

## 2022-08-20 ENCOUNTER — Ambulatory Visit: Payer: Medicare Other | Admitting: Radiation Oncology

## 2022-08-20 ENCOUNTER — Ambulatory Visit: Payer: Medicare Other

## 2022-09-02 ENCOUNTER — Ambulatory Visit: Payer: Medicare Other | Attending: Cardiovascular Disease

## 2022-09-02 DIAGNOSIS — Z5181 Encounter for therapeutic drug level monitoring: Secondary | ICD-10-CM | POA: Insufficient documentation

## 2022-09-02 LAB — POCT INR: INR: 2.5 (ref 2.0–3.0)

## 2022-09-02 NOTE — Patient Instructions (Signed)
Description   Continue taking warfarin 1.5 tablets of warfarin daily except for 2 tablets on Sundays. Recheck INR in 6 weeks. [facility] [phone] or [phone]

## 2022-09-14 NOTE — Progress Notes (Signed)
Martha Edwards was called today for follow up for radiation treatment for lung cancer. She completed treatment on 08-18-22.   PAIN: Pt denies pain at this time.   RESPIRATORY: None. Pt is on room air. Pt denies shortness of breath or cough. Pt reports overall good energy level.   SWALLOWING/DIET: Pt denies dysphagia  The patient eats a regular, healthy diet.. Pt reports she is eating well and that her family provides food for her.   OTHER: Pt reports baseline energy level. She states she has "old lady lazy" energy level. Pt denies weakness or falls. Pt and family had no questions or concerns at this time. They were very grateful for the care in our department and by Dr. Basilio Cairo.   There were no vitals taken for this visit.   Wt Readings from Last 3 Encounters:  08/09/22 138 lb (62.6 kg)  06/29/22 138 lb 9.6 oz (62.9 kg)  06/22/22 135 lb (61.2 kg)

## 2022-09-23 ENCOUNTER — Ambulatory Visit
Admission: RE | Admit: 2022-09-23 | Discharge: 2022-09-23 | Disposition: A | Discharge status: Home / Self Care | Payer: Medicare Other | Source: Ambulatory Visit | Attending: Radiation Oncology | Admitting: Radiation Oncology

## 2022-09-23 ENCOUNTER — Telehealth: Payer: Self-pay

## 2022-09-23 ENCOUNTER — Encounter: Payer: Self-pay | Admitting: Radiation Oncology

## 2022-09-23 NOTE — Telephone Encounter (Signed)
Rn left message for pt to call RN for telephone follow up. Pt came to clinic due to confusion of this being a telephone follow up. Rn also left message for pt son as well.

## 2022-09-23 NOTE — Telephone Encounter (Signed)
Rn will able to reach pt of telephone follow up . Note completed and routed to Dr. Basilio Cairo for review.

## 2022-10-05 ENCOUNTER — Ambulatory Visit
Admission: RE | Admit: 2022-10-05 | Discharge: 2022-10-05 | Disposition: A | Discharge status: Home / Self Care | Payer: Medicare Other | Source: Ambulatory Visit | Attending: Adult Health | Admitting: Adult Health

## 2022-10-05 DIAGNOSIS — E349 Endocrine disorder, unspecified: Secondary | ICD-10-CM | POA: Diagnosis not present

## 2022-10-05 DIAGNOSIS — M8588 Other specified disorders of bone density and structure, other site: Secondary | ICD-10-CM | POA: Diagnosis not present

## 2022-10-05 DIAGNOSIS — M8580 Other specified disorders of bone density and structure, unspecified site: Secondary | ICD-10-CM

## 2022-10-05 DIAGNOSIS — N958 Other specified menopausal and perimenopausal disorders: Secondary | ICD-10-CM | POA: Diagnosis not present

## 2022-10-06 ENCOUNTER — Telehealth: Payer: Self-pay

## 2022-10-06 NOTE — Telephone Encounter (Signed)
-----   Message from Noreene Filbert sent at 10/05/2022  4:53 PM EDT ----- Bone density is worse.  Please offer patient appt in the next few weeks to discuss if interested ----- Message ----- From: Interface, Rad Results In Sent: 10/05/2022   3:28 PM EDT To: Loa Socks, NP

## 2022-10-06 NOTE — Telephone Encounter (Signed)
Called and spoke with son, Darryl who answered mobile #. Given below message. He will give message to [facility] and requests appt with Lillard Anes, NP.  Sent scheduling message.

## 2022-10-07 ENCOUNTER — Telehealth: Payer: Self-pay | Admitting: Adult Health

## 2022-10-07 NOTE — Telephone Encounter (Signed)
Pt son returned phone call in reference to bone scan results. Pt and son agreed to schedule appt with Lillard Anes, DNP, and speak about results and options.

## 2022-10-12 ENCOUNTER — Ambulatory Visit: Payer: Medicare Other | Admitting: Adult Health

## 2022-10-14 ENCOUNTER — Inpatient Hospital Stay: Payer: Medicare Other | Attending: Adult Health | Admitting: Adult Health

## 2022-10-14 ENCOUNTER — Encounter: Payer: Self-pay | Admitting: Adult Health

## 2022-10-14 ENCOUNTER — Ambulatory Visit: Payer: Medicare Other | Attending: Internal Medicine

## 2022-10-14 VITALS — BP 127/75 | HR 78 | Temp 97.7°F | Resp 17 | Wt 136.2 lb

## 2022-10-14 DIAGNOSIS — E7800 Pure hypercholesterolemia, unspecified: Secondary | ICD-10-CM | POA: Insufficient documentation

## 2022-10-14 DIAGNOSIS — N3281 Overactive bladder: Secondary | ICD-10-CM | POA: Insufficient documentation

## 2022-10-14 DIAGNOSIS — M85851 Other specified disorders of bone density and structure, right thigh: Secondary | ICD-10-CM | POA: Diagnosis not present

## 2022-10-14 DIAGNOSIS — C50211 Malignant neoplasm of upper-inner quadrant of right female breast: Secondary | ICD-10-CM | POA: Insufficient documentation

## 2022-10-14 DIAGNOSIS — C3431 Malignant neoplasm of lower lobe, right bronchus or lung: Secondary | ICD-10-CM | POA: Insufficient documentation

## 2022-10-14 DIAGNOSIS — I129 Hypertensive chronic kidney disease with stage 1 through stage 4 chronic kidney disease, or unspecified chronic kidney disease: Secondary | ICD-10-CM | POA: Diagnosis not present

## 2022-10-14 DIAGNOSIS — Z923 Personal history of irradiation: Secondary | ICD-10-CM | POA: Diagnosis not present

## 2022-10-14 DIAGNOSIS — Z803 Family history of malignant neoplasm of breast: Secondary | ICD-10-CM | POA: Insufficient documentation

## 2022-10-14 DIAGNOSIS — Z7901 Long term (current) use of anticoagulants: Secondary | ICD-10-CM | POA: Insufficient documentation

## 2022-10-14 DIAGNOSIS — I4892 Unspecified atrial flutter: Secondary | ICD-10-CM | POA: Insufficient documentation

## 2022-10-14 DIAGNOSIS — Z170 Estrogen receptor positive status [ER+]: Secondary | ICD-10-CM | POA: Diagnosis not present

## 2022-10-14 DIAGNOSIS — Z87891 Personal history of nicotine dependence: Secondary | ICD-10-CM | POA: Insufficient documentation

## 2022-10-14 DIAGNOSIS — Z8042 Family history of malignant neoplasm of prostate: Secondary | ICD-10-CM | POA: Diagnosis not present

## 2022-10-14 DIAGNOSIS — Z79811 Long term (current) use of aromatase inhibitors: Secondary | ICD-10-CM | POA: Diagnosis not present

## 2022-10-14 DIAGNOSIS — M810 Age-related osteoporosis without current pathological fracture: Secondary | ICD-10-CM | POA: Insufficient documentation

## 2022-10-14 DIAGNOSIS — M816 Localized osteoporosis [Lequesne]: Secondary | ICD-10-CM | POA: Diagnosis not present

## 2022-10-14 DIAGNOSIS — Z5181 Encounter for therapeutic drug level monitoring: Secondary | ICD-10-CM | POA: Insufficient documentation

## 2022-10-14 LAB — POCT INR: INR: 2.9 (ref 2.0–3.0)

## 2022-10-14 NOTE — Progress Notes (Signed)
[facility] Cancer Follow up:    [name], MD [address] [place] [address]   DIAGNOSIS:  Cancer Staging  Carcinoma of upper-inner quadrant of right female breast [facility]) Staging form: Breast, AJCC 7th Edition - Pathologic stage from 04/07/2015: Stage Unknown (T1c, NX, cM0) - Unsigned Laterality: Right - Clinical: Stage IA (T1c, N0, M0) - Signed by Lowella Dell, MD on 05/16/2015  Malignant neoplasm of lower lobe of right lung [facility]) Staging form: Lung, AJCC 8th Edition - Clinical stage from 07/02/2022: Stage IA2 (cT1b, cN0, cM0) - Signed by Lonie Peak, MD on 07/03/2022 Stage prefix: Initial diagnosis   SUMMARY OF ONCOLOGIC HISTORY: Oncology History  Carcinoma of upper-inner quadrant of right female breast (HCC)  01/20/2015 Mammogram   In the right breast, a possible mass warranting further evaluation. In the left breast, no findings suspicious for malignancy   02/19/2015 Breast US   Right breast: irregularly marginated mass with increased vascularity located at 12:30 o'clock position 4 cm from the nipple measuring 1.3 x 1.3 x 1.0 cm in size   02/27/2015 Initial Biopsy   Right breast core needle bx: Invasive ductal carcinoma, grade 1-2, ER+ (100%), PR+ (90%), HER2/neu negative (ratio 1.15), Ki6710%   02/27/2015 Clinical Stage   Stage IA: T1c No   04/07/2015 Definitive Surgery   Invasive ductal carcinoma, grade 1, ER+, PR+, HER2/neu negative, Ki67 10%   04/07/2015 Pathologic Stage   Stage IA: pT1c pNx    Radiation Therapy   pt declined   05/16/2015 - 05/2020 Anti-estrogen oral therapy   Anastrozole 1 mg daily.   05/21/2015 Survivorship   Survivorship care plan completed and mailed to patient in lieu of in person visit at her request   Malignant neoplasm of lower lobe of right lung (HCC)  07/02/2022 Cancer Staging   Staging form: Lung, AJCC 8th Edition - Clinical stage from 07/02/2022: Stage IA2 (cT1b, cN0, cM0) - Signed by Lonie Peak, MD on 07/03/2022 Stage prefix: Initial diagnosis   07/03/2022 Initial Diagnosis   Malignant neoplasm of lower lobe of right lung (HCC)     CURRENT THERAPY: observation  INTERVAL HISTORY: Elvina Day Morre 89 y.o. female returns for follow-up to discuss bone density results.  She underwent bone density testing on October 05, 2022 demonstrating osteoporosis with a T-score -2.7 in the right femur.    She was previously on Prolia from May 06, 2016 through April 02, 2020 and received this every 6 months.  It was discontinued by Dr. Darnelle Catalan and her bone density in August 2022 demonstrated a T-score of -2.2 in the right femur. She has longstanding reflux and is on Warfarin managed by her PCP.   Patient Active Problem List   Diagnosis Date Noted   Osteoporosis 10/14/2022   Malignant neoplasm of lower lobe of right lung (HCC) 07/03/2022   Lung nodule 06/10/2022   Genetic testing 09/20/2017   Family history of breast cancer    Family history of prostate cancer    Encounter for therapeutic drug monitoring 05/02/2017   Osteopenia determined by x-ray 04/20/2016   Carcinoma of upper-inner quadrant of right female breast (HCC) 05/16/2015   Tachy-brady syndrome (HCC) 10/17/2014   Paroxysmal atrial flutter (HCC) 08/30/2013   Chronic anticoagulation 08/30/2013   Essential hypertension 08/30/2013    has No Known Allergies.  MEDICAL HISTORY: Past Medical History:  Diagnosis Date   Anticoagulated    Anxiety    Arthritis of hand    Atrial flutter (  HCC)    with LVEF 60%, LVH, LAE and holter with rate control and Ave HR 60 bpm    Breast cancer (HCC) 2017    RIGHT BREAST CA   Carotid artery plaque    mild bilateral carotid plaque without significant stenosis (<20% in left ICA), 04/2010   CKD (chronic kidney disease) stage 2, GFR 60-89 ml/min    Pt states she does have this   Diverticula, colon    Dyspnea    Family history of breast cancer    Family history of prostate cancer     Hypercholesterolemia    Hypertension    with LVH on echo 06/03/11   Hypertonicity of bladder    Insomnia    Mitral regurgitation    Osteopenia    > osteoporosis   Overactive bladder     SURGICAL HISTORY: Past Surgical History:  Procedure Laterality Date   BREAST BIOPSY     BREAST LUMPECTOMY Right    2017   BREAST LUMPECTOMY WITH RADIOACTIVE SEED LOCALIZATION Right 04/07/2015   Procedure: RIGHT BREAST LUMPECTOMY WITH RADIOACTIVE SEED LOCALIZATION;  Surgeon: Glenna Fellows, MD;  Location: [facility];  Service: General;  Laterality: Right;   BRONCHIAL BIOPSY  06/22/2022   Procedure: BRONCHIAL BIOPSIES;  Surgeon: [name], DO;  Location: [facility];  Service: Pulmonary;;   BRONCHIAL NEEDLE ASPIRATION BIOPSY  06/22/2022   Procedure: BRONCHIAL NEEDLE ASPIRATION BIOPSIES;  Surgeon: [name], DO;  Location: [facility];  Service: Pulmonary;;   CATARACT EXTRACTION W/ INTRAOCULAR LENS  IMPLANT, BILATERAL Bilateral    2017-2018   COLONOSCOPY  2001   2007 sigmoidoscopy     SOCIAL HISTORY: Social History   Socioeconomic History   Marital status: Single    Spouse name: Not on file   Number of children: Not on file   Years of education: Not on file   Highest education level: Not on file  Occupational History   Not on file  Tobacco Use   Smoking status: Former    Current packs/day: 0.00    Types: Cigarettes    Quit date: 02/02/1984    Years since quitting: 38.7   Smokeless tobacco: Never  Substance and Sexual Activity   Alcohol use: Not Currently   Drug use: Not on file   Sexual activity: Never  Other Topics Concern   Not on file  Social History Narrative   Not on file   Social Determinants of Health   Financial Resource Strain: Not on file  Food Insecurity: No Food Insecurity (07/01/2022)   Hunger Vital Sign    Worried About Running Out of Food in the Last Year: Never true    Ran Out of Food in the Last Year: Never true  Transportation  Needs: No Transportation Needs (07/01/2022)   PRAPARE - Administrator, Civil Service (Medical): No    Lack of Transportation (Non-Medical): No  Physical Activity: Not on file  Stress: Not on file  Social Connections: Not on file  Intimate Partner Violence: Not At Risk (07/01/2022)   Humiliation, Afraid, Rape, and Kick questionnaire    Fear of Current or Ex-Partner: No    Emotionally Abused: No    Physically Abused: No    Sexually Abused: No    FAMILY HISTORY: Family History  Problem Relation Age of Onset   Diabetes Mother    Liver cancer Mother    Breast cancer Sister 36   Prostate cancer Brother 28  metastatic   Heart attack Neg Hx    Stroke Neg Hx     Review of Systems  Constitutional:  Negative for appetite change, chills, fatigue, fever and unexpected weight change.  HENT:   Negative for hearing loss, lump/mass and trouble swallowing.   Eyes:  Negative for eye problems and icterus.  Respiratory:  Negative for chest tightness, cough and shortness of breath.   Cardiovascular:  Negative for chest pain, leg swelling and palpitations.  Gastrointestinal:  Negative for abdominal distention, abdominal pain, constipation, diarrhea, nausea and vomiting.  Endocrine: Negative for hot flashes.  Genitourinary:  Negative for difficulty urinating.   Musculoskeletal:  Negative for arthralgias.  Skin:  Negative for itching and rash.  Neurological:  Negative for dizziness, extremity weakness, headaches and numbness.  Hematological:  Negative for adenopathy. Does not bruise/bleed easily.  Psychiatric/Behavioral:  Negative for depression. The patient is not nervous/anxious.       PHYSICAL EXAMINATION  Vitals:   10/14/22 1348  BP: 127/75  Pulse: 78  Resp: 17  Temp: 97.7 F (36.5 C)  SpO2: 95%    Physical Exam Constitutional:      General: She is not in acute distress.    Appearance: Normal appearance. She is not toxic-appearing.  HENT:     Head: Normocephalic  and atraumatic.     Mouth/Throat:     Mouth: Mucous membranes are moist.     Pharynx: Oropharynx is clear. No oropharyngeal exudate or posterior oropharyngeal erythema.  Eyes:     General: No scleral icterus. Cardiovascular:     Rate and Rhythm: Normal rate and regular rhythm.     Pulses: Normal pulses.     Heart sounds: Normal heart sounds.  Pulmonary:     Effort: Pulmonary effort is normal.     Breath sounds: Normal breath sounds.  Chest:     Comments: Right breast status postlumpectomy and radiation no sign of local recurrence left breast is benign. Abdominal:     General: Abdomen is flat. Bowel sounds are normal. There is no distension.     Palpations: Abdomen is soft.     Tenderness: There is no abdominal tenderness.  Musculoskeletal:        General: No swelling.     Cervical back: Neck supple.  Lymphadenopathy:     Cervical: No cervical adenopathy.  Skin:    General: Skin is warm and dry.     Findings: No rash.  Neurological:     General: No focal deficit present.     Mental Status: She is alert.  Psychiatric:        Mood and Affect: Mood normal.        Behavior: Behavior normal.     LABORATORY DATA:  CBC    Component Value Date/Time   WBC 6.7 06/22/2022 0710   RBC 4.67 06/22/2022 0710   HGB 13.5 06/22/2022 0710   HGB 13.5 04/08/2021 0944   HGB 13.1 10/21/2016 1334   HCT 42.4 06/22/2022 0710   HCT 39.6 10/21/2016 1334   PLT 194 06/22/2022 0710   PLT 226 04/08/2021 0944   PLT 203 10/21/2016 1334   MCV 90.8 06/22/2022 0710   MCV 86.0 10/21/2016 1334   MCH 28.9 06/22/2022 0710   MCHC 31.8 06/22/2022 0710   RDW 14.4 06/22/2022 0710   RDW 15.9 (H) 10/21/2016 1334   LYMPHSABS 2.4 04/08/2021 0944   LYMPHSABS 1.6 10/21/2016 1334   MONOABS 0.7 04/08/2021 0944   MONOABS 0.5 10/21/2016 1334   EOSABS  0.2 04/08/2021 0944   EOSABS 0.1 10/21/2016 1334   BASOSABS 0.0 04/08/2021 0944   BASOSABS 0.0 10/21/2016 1334    CMP     Component Value Date/Time   NA  140 06/22/2022 0710   NA 142 10/21/2016 1334   K 4.2 06/22/2022 0710   K 4.2 10/21/2016 1334   CL 106 06/22/2022 0710   CO2 23 06/22/2022 0710   CO2 25 10/21/2016 1334   GLUCOSE 84 06/22/2022 0710   GLUCOSE 85 10/21/2016 1334   BUN 15 06/22/2022 0710   BUN 11.5 10/21/2016 1334   CREATININE 1.26 (H) 06/22/2022 0710   CREATININE 1.12 (H) 04/08/2021 0944   CREATININE 0.9 10/21/2016 1334   CALCIUM 9.9 06/22/2022 0710   CALCIUM 9.9 10/21/2016 1334   PROT 7.3 04/08/2021 0944   PROT 7.2 10/21/2016 1334   ALBUMIN 4.5 04/08/2021 0944   ALBUMIN 4.1 10/21/2016 1334   AST 20 04/08/2021 0944   AST 19 10/21/2016 1334   ALT 11 04/08/2021 0944   ALT 10 10/21/2016 1334   ALKPHOS 66 04/08/2021 0944   ALKPHOS 49 10/21/2016 1334   BILITOT 0.6 04/08/2021 0944   BILITOT 0.76 10/21/2016 1334   GFRNONAA 41 (L) 06/22/2022 0710   GFRNONAA 48 (L) 04/08/2021 0944   GFRAA 53 (L) 10/05/2019 0947       ASSESSMENT and THERAPY PLAN:   Carcinoma of upper-inner quadrant of right female breast (HCC) Martha Edwards is an 89 year old woman with history of stage Ia ER/PR positive invasive ductal carcinoma of the right breast diagnosed in January 2017.  She is status post lumpectomy followed by adjuvant antiestrogen therapy with anastrozole that began in April 2017 through April 2022.  History of right breast stage Ia ER/PR positive breast cancer: She continues on observation alone with annual mammograms which she will continue.  No clinical signs of breast cancer recurrence.  She will return in 1 year for continued long-term follow-up.  Malignant neoplasm of lower lobe of right lung [facility]) She is due for PET scan next month-this is not yet scheduled.  I am reaching out to Dr. Colletta [place] nurse to work on ensuring this is scheduled.  She also has follow-up with Dr. Basilio Cairo scheduled.  Osteoporosis Her bone density testing has declined from -2.2 to -2.7.  After discussion of risks and benefits Alevia will proceed with  Zometa given annually.  I asked that this occur in 1 to 2 weeks.  We discussed the importance of calcium intake and reviewed the risk of osteonecrosis of the jaw.  She will be due for repeat bone density testing in September 2026.    All questions were answered. The patient knows to call the clinic with any problems, questions or concerns. We can certainly see the patient much sooner if necessary.  Total encounter time:30 minutes*in face-to-face visit time, chart review, lab review, care coordination, order entry, and documentation of the encounter time.    Lillard Anes, NP 10/18/22 8:28 AM Medical Oncology and Hematology [facility] [address] [place], [place] [address] Tel. [phone]    Fax. [phone]  *Total Encounter Time as defined by the Centers for Medicare and Medicaid Services includes, in addition to the face-to-face time of a patient visit (documented in the note above) non-face-to-face time: obtaining and reviewing outside history, ordering and reviewing medications, tests or procedures, care coordination (communications with other health care professionals or caregivers) and documentation in the medical record.

## 2022-10-14 NOTE — Patient Instructions (Signed)
Description   Continue taking warfarin 1.5 tablets of warfarin daily except for 2 tablets on Sundays. Recheck INR in 6 weeks. [facility] [phone] or [phone]

## 2022-10-14 NOTE — Patient Instructions (Signed)
Bone Health Bones protect organs, store calcium, anchor muscles, and support the whole body. Keeping your bones strong is important, especially as you get older. You can take actions to help keep your bones strong and healthy. Why is keeping my bones healthy important?  Keeping your bones healthy is important because your body constantly replaces bone cells. Cells get old, and new cells take their place. As we age, we lose bone cells because the body may not be able to make enough new cells to replace the old cells. The amount of bone cells and bone tissue you have is referred to as bone mass. The higher your bone mass, the stronger your bones. The aging process leads to an overall loss of bone mass in the body, which can increase the likelihood of: Broken bones. A condition in which the bones become weak and brittle (osteoporosis). A large decline in bone mass occurs in older adults. In women, it occurs about the time of menopause. What actions can I take to keep my bones healthy? Good health habits are important for maintaining healthy bones. This includes eating nutritious foods and exercising regularly. To have healthy bones, you need to get enough of the right minerals and vitamins. Most nutrition experts recommend getting these nutrients from the foods that you eat. In some cases, taking supplements may also be recommended. Doing certain types of exercise is also important for bone health. What are the nutritional recommendations for healthy bones?  Eating a well-balanced diet with plenty of calcium and vitamin D will help to protect your bones. Nutritional recommendations vary from person to person. Ask your health care provider what is healthy for you. Here are some general guidelines. Get enough calcium Calcium is the most important (essential) mineral for bone health. Most people can get enough calcium from their diet, but supplements may be recommended for people who are at risk for  osteoporosis. Good sources of calcium include: Dairy products, such as low-fat or nonfat milk, cheese, and yogurt. Dark green leafy vegetables, such as bok choy and broccoli. Foods that have calcium added to them (are fortified). Foods that may be fortified with calcium include orange juice, cereal, bread, soy beverages, and tofu products. Nuts, such as almonds. Follow these recommended amounts for daily calcium intake: Infants, 0-6 months: 200 mg. Infants, 6-12 months: 260 mg. Children, age 421-3: 700 mg. Children, age 42-8: 1,000 mg. Children, age 429-13: 1,300 mg. Teens, age 52-18: 1,300 mg. Adults, age 26-50: 1,000 mg. Adults, age 25-70: Men: 1,000 mg. Women: 1,200 mg. Adults, age 80 or older: 1,200 mg. Pregnant and breastfeeding females: Teens: 1,300 mg. Adults: 1,000 mg. Get enough vitamin D Vitamin D is the most essential vitamin for bone health. It helps the body absorb calcium. Sunlight stimulates the skin to make vitamin D, so be sure to get enough sunlight. If you live in a cold climate or you do not get outside often, your health care provider may recommend that you take vitamin D supplements. Good sources of vitamin D in your diet include: Egg yolks. Saltwater fish. Milk and cereal fortified with vitamin D. Follow these recommended amounts for daily vitamin D intake: Infants, 0-12 months: 400 international units (IU). Children and teens, age 421-18: 600 international units. Adults, age 421 or younger: 600 international units. Adults, age 82 or older: 600-1,000 international units. Get other important nutrients Other nutrients that are important for bone health include: Phosphorus. This mineral is found in meat, poultry, dairy foods, nuts, and legumes. The  recommended daily intake for adult men and adult women is 700 mg. Magnesium. This mineral is found in seeds, nuts, dark green vegetables, and legumes. The recommended daily intake for adult men is 400-420 mg. For adult women,  it is 310-320 mg. Vitamin K. This vitamin is found in green leafy vegetables. The recommended daily intake is 120 mcg for adult men and 90 mcg for adult women. What type of physical activity is best for building and maintaining healthy bones? Weight-bearing and strength-building activities are important for building and maintaining healthy bones. Weight-bearing activities cause muscles and bones to work against gravity. Strength-building activities increase the strength of the muscles that support bones. Weight-bearing and muscle-building activities include: Walking and hiking. Jogging and running. Dancing. Gym exercises. Lifting weights. Tennis and racquetball. Climbing stairs. Aerobics. Adults should get at least 30 minutes of moderate physical activity on most days. Children should get at least 60 minutes of moderate physical activity on most days. Ask your health care provider what type of exercise is best for you. How can I find out if my bone mass is low? Bone mass can be measured with an X-ray test called a bone mineral density (BMD) test. This test is recommended for all women who are age 26 or older. It may also be recommended for: Men who are age 27 or older. People who are at risk for osteoporosis because of: Having a long-term disease that weakens bones, such as kidney disease or rheumatoid arthritis. Having menopause earlier than normal. Taking medicine that weakens bones, such as steroids, thyroid hormones, or hormone treatment for breast cancer or prostate cancer. Smoking. Drinking three or more alcoholic drinks a day. Being underweight. Sedentary lifestyle. If you find that you have a low bone mass, you may be able to prevent osteoporosis or further bone loss by changing your diet and lifestyle. Where can I find more information? Bone Health & Osteoporosis Foundation: https://carlson-fletcher.info/ [employer]: www.bones.http://www.myers.net/ International Osteoporosis  Foundation: Investment banker, operational.iofbonehealth.org Summary The aging process leads to an overall loss of bone mass in the body, which can increase the likelihood of broken bones and osteoporosis. Eating a well-balanced diet with plenty of calcium and vitamin D will help to protect your bones. Weight-bearing and strength-building activities are also important for building and maintaining strong bones. Bone mass can be measured with an X-ray test called a bone mineral density (BMD) test. This information is not intended to replace advice given to you by your health care provider. Make sure you discuss any questions you have with your health care provider. Document Revised: 07/02/2020 Document Reviewed: 07/02/2020 Elsevier Patient Education  2024 Elsevier Inc. Zoledronic Acid Injection (Bone Disorders) What is this medication? ZOLEDRONIC ACID (ZOE le dron ik AS id) prevents and treats osteoporosis. It may also be used to treat Paget's disease of the bone. It works by Interior and spatial designer stronger and less likely to break (fracture). It belongs to a group of medications called bisphosphonates. This medicine may be used for other purposes; ask your health care provider or pharmacist if you have questions. COMMON BRAND NAME(S): Reclast What should I tell my care team before I take this medication? They need to know if you have any of these conditions: Bleeding disorder Cancer Dental disease Kidney disease Low levels of calcium in the blood Low red blood cell counts Lung or breathing disease, such as asthma Receiving steroids, such as dexamethasone or prednisone An unusual or allergic reaction to zoledronic acid, other medications, foods, dyes, or  preservatives Pregnant or trying to get pregnant Breast-feeding How should I use this medication? This medication is injected into a vein. It is given by your care team in a hospital or clinic setting. A special MedGuide will be given to you before each treatment. Be  sure to read this information carefully each time. Talk to your care team about the use of this medication in children. Special care may be needed. Overdosage: If you think you have taken too much of this medicine contact a poison control center or emergency room at once. NOTE: This medicine is only for you. Do not share this medicine with others. What if I miss a dose? Keep appointments for follow-up doses. It is important not to miss your dose. Call your care team if you are unable to keep an appointment. What may interact with this medication? Certain antibiotics given by injection Medications for pain and inflammation, such as ibuprofen, naproxen, NSAIDs Some diuretics, such as bumetanide, furosemide Teriparatide This list may not describe all possible interactions. Give your health care provider a list of all the medicines, herbs, non-prescription drugs, or dietary supplements you use. Also tell them if you smoke, drink alcohol, or use illegal drugs. Some items may interact with your medicine. What should I watch for while using this medication? Visit your care team for regular checks on your progress. It may be some time before you see the benefit from this medication. Some people who take this medication have severe bone, joint, or muscle pain. This medication may also increase your risk for jaw problems or a broken thigh bone. Tell your care team right away if you have severe pain in your jaw, bones, joints, or muscles. Tell your care team if you have any pain that does not go away or that gets worse. You should make sure you get enough calcium and vitamin D while you are taking this medication. Discuss the foods you eat and the vitamins you take with your care team. You may need bloodwork while taking this medication. Tell your dentist and dental surgeon that you are taking this medication. You should not have major dental surgery while on this medication. See your dentist to have a dental  exam and fix any dental problems before starting this medication. Take good care of your teeth while on this medication. Make sure you see your dentist for regular follow-up appointments. What side effects may I notice from receiving this medication? Side effects that you should report to your care team as soon as possible: Allergic reactions--skin rash, itching, hives, swelling of the face, lips, tongue, or throat Kidney injury--decrease in the amount of urine, swelling of the ankles, hands, or feet Low calcium level--muscle pain or cramps, confusion, tingling, or numbness in the hands or feet Osteonecrosis of the jaw--pain, swelling, or redness in the mouth, numbness of the jaw, poor healing after dental work, unusual discharge from the mouth, visible bones in the mouth Severe bone, joint, or muscle pain Side effects that usually do not require medical attention (report to your care team if they continue or are bothersome): Diarrhea Dizziness Headache Nausea Stomach pain Vomiting This list may not describe all possible side effects. Call your doctor for medical advice about side effects. You may report side effects to FDA at 1-800-FDA-1088. Where should I keep my medication? This medication is given in a hospital or clinic. It will not be stored at home. NOTE: This sheet is a summary. It may not cover all possible information.  If you have questions about this medicine, talk to your doctor, pharmacist, or health care provider.  2024 Elsevier/Gold Standard (2021-03-06 00:00:00)

## 2022-10-18 ENCOUNTER — Telehealth: Payer: Self-pay | Admitting: *Deleted

## 2022-10-18 ENCOUNTER — Encounter: Payer: Self-pay | Admitting: Adult Health

## 2022-10-18 NOTE — Assessment & Plan Note (Signed)
Her bone density testing has declined from -2.2 to -2.7.  After discussion of risks and benefits Martha Edwards will proceed with Zometa given annually.  I asked that this occur in 1 to 2 weeks.  We discussed the importance of calcium intake and reviewed the risk of osteonecrosis of the jaw.  She will be due for repeat bone density testing in September 2026.

## 2022-10-18 NOTE — Telephone Encounter (Signed)
CALLED PATIENT TO INFORM OF PET SCAN FOR 11-23-22- ARRIVAL TIME- 1:30 PM @ [facility], PATIENT TO HAVE WATER ONLY- 6 HRS. PRIOR TO TEST, PATIENT TO RECEIVE RESULTS FROM DR. SQUIRE ON 11-26-22 @ 2 PM, SPOKE WITH PATIENT AND SHE IS AWARE OF THESE APPTS. AND THE INSTRUCTIONS

## 2022-10-18 NOTE — Assessment & Plan Note (Signed)
Martha Edwards is an 89 year old woman with history of stage Ia ER/PR positive invasive ductal carcinoma of the right breast diagnosed in January 2017.  She is status post lumpectomy followed by adjuvant antiestrogen therapy with anastrozole that began in April 2017 through April 2022.  History of right breast stage Ia ER/PR positive breast cancer: She continues on observation alone with annual mammograms which she will continue.  No clinical signs of breast cancer recurrence.  She will return in 1 year for continued long-term follow-up.

## 2022-10-18 NOTE — Assessment & Plan Note (Addendum)
She is due for PET scan next month-this is not yet scheduled.  I am reaching out to Dr. Colletta [place] nurse to work on ensuring this is scheduled.  She also has follow-up with Dr. Basilio Cairo scheduled.

## 2022-10-22 ENCOUNTER — Other Ambulatory Visit: Payer: Self-pay | Admitting: *Deleted

## 2022-10-22 ENCOUNTER — Ambulatory Visit
Admission: RE | Admit: 2022-10-22 | Discharge: 2022-10-22 | Disposition: A | Discharge status: Home / Self Care | Payer: Medicare Other | Source: Ambulatory Visit | Attending: Family Medicine | Admitting: Family Medicine

## 2022-10-22 DIAGNOSIS — R921 Mammographic calcification found on diagnostic imaging of breast: Secondary | ICD-10-CM | POA: Diagnosis not present

## 2022-10-22 DIAGNOSIS — M816 Localized osteoporosis [Lequesne]: Secondary | ICD-10-CM

## 2022-10-22 DIAGNOSIS — C50211 Malignant neoplasm of upper-inner quadrant of right female breast: Secondary | ICD-10-CM

## 2022-10-25 ENCOUNTER — Inpatient Hospital Stay: Payer: Medicare Other

## 2022-10-25 ENCOUNTER — Other Ambulatory Visit: Payer: Self-pay | Admitting: Family Medicine

## 2022-10-25 ENCOUNTER — Other Ambulatory Visit: Payer: Self-pay | Admitting: Adult Health

## 2022-10-25 VITALS — BP 116/70 | HR 68 | Temp 98.0°F | Resp 16

## 2022-10-25 DIAGNOSIS — I129 Hypertensive chronic kidney disease with stage 1 through stage 4 chronic kidney disease, or unspecified chronic kidney disease: Secondary | ICD-10-CM | POA: Diagnosis not present

## 2022-10-25 DIAGNOSIS — M816 Localized osteoporosis [Lequesne]: Secondary | ICD-10-CM

## 2022-10-25 DIAGNOSIS — R921 Mammographic calcification found on diagnostic imaging of breast: Secondary | ICD-10-CM

## 2022-10-25 DIAGNOSIS — M8580 Other specified disorders of bone density and structure, unspecified site: Secondary | ICD-10-CM

## 2022-10-25 DIAGNOSIS — C50211 Malignant neoplasm of upper-inner quadrant of right female breast: Secondary | ICD-10-CM

## 2022-10-25 DIAGNOSIS — Z170 Estrogen receptor positive status [ER+]: Secondary | ICD-10-CM | POA: Diagnosis not present

## 2022-10-25 DIAGNOSIS — M85851 Other specified disorders of bone density and structure, right thigh: Secondary | ICD-10-CM | POA: Diagnosis not present

## 2022-10-25 DIAGNOSIS — Z79811 Long term (current) use of aromatase inhibitors: Secondary | ICD-10-CM | POA: Diagnosis not present

## 2022-10-25 DIAGNOSIS — C3431 Malignant neoplasm of lower lobe, right bronchus or lung: Secondary | ICD-10-CM | POA: Diagnosis not present

## 2022-10-25 LAB — CBC WITH DIFFERENTIAL (CANCER CENTER ONLY)
Abs Immature Granulocytes: 0.02 10*3/uL (ref 0.00–0.07)
Basophils Absolute: 0 10*3/uL (ref 0.0–0.1)
Basophils Relative: 1 %
Eosinophils Absolute: 0.3 10*3/uL (ref 0.0–0.5)
Eosinophils Relative: 4 %
HCT: 40.4 % (ref 36.0–46.0)
Hemoglobin: 13.2 g/dL (ref 12.0–15.0)
Immature Granulocytes: 0 %
Lymphocytes Relative: 17 %
Lymphs Abs: 1.3 10*3/uL (ref 0.7–4.0)
MCH: 29.3 pg (ref 26.0–34.0)
MCHC: 32.7 g/dL (ref 30.0–36.0)
MCV: 89.8 fL (ref 80.0–100.0)
Monocytes Absolute: 0.7 10*3/uL (ref 0.1–1.0)
Monocytes Relative: 8 %
Neutro Abs: 5.6 10*3/uL (ref 1.7–7.7)
Neutrophils Relative %: 70 %
Platelet Count: 218 10*3/uL (ref 150–400)
RBC: 4.5 MIL/uL (ref 3.87–5.11)
RDW: 14.3 % (ref 11.5–15.5)
WBC Count: 8 10*3/uL (ref 4.0–10.5)
nRBC: 0 % (ref 0.0–0.2)

## 2022-10-25 LAB — CMP (CANCER CENTER ONLY)
ALT: 10 U/L (ref 0–44)
AST: 21 U/L (ref 15–41)
Albumin: 4.1 g/dL (ref 3.5–5.0)
Alkaline Phosphatase: 67 U/L (ref 38–126)
Anion gap: 7 (ref 5–15)
BUN: 14 mg/dL (ref 8–23)
CO2: 28 mmol/L (ref 22–32)
Calcium: 10.1 mg/dL (ref 8.9–10.3)
Chloride: 109 mmol/L (ref 98–111)
Creatinine: 1.22 mg/dL — ABNORMAL HIGH (ref 0.44–1.00)
GFR, Estimated: 43 mL/min — ABNORMAL LOW (ref >60)
Glucose, Bld: 94 mg/dL (ref 70–99)
Potassium: 4.6 mmol/L (ref 3.5–5.1)
Sodium: 144 mmol/L (ref 135–145)
Total Bilirubin: 0.6 mg/dL (ref 0.3–1.2)
Total Protein: 7.2 g/dL (ref 6.5–8.1)

## 2022-10-25 MED ORDER — SODIUM CHLORIDE 0.9 % IV SOLN: Freq: Once | INTRAVENOUS | Status: DC

## 2022-10-25 MED ORDER — SODIUM CHLORIDE 0.9 % IV SOLN: INTRAVENOUS | Status: DC

## 2022-10-25 MED ORDER — ZOLEDRONIC ACID 4 MG/5ML IV CONC
3.0000 mg | Freq: Once | INTRAVENOUS | Status: AC
  Administered 2022-10-25: 3 mg via INTRAVENOUS
  Filled 2022-10-25: qty 3.75

## 2022-10-25 NOTE — Progress Notes (Signed)
Per Lillard Anes, NP, OK to administer Zometa today with Cr 1.22, calcium 10.1.

## 2022-10-25 NOTE — Progress Notes (Signed)
Per Lillard Anes OK to treat with no dental clearance

## 2022-10-25 NOTE — Patient Instructions (Signed)

## 2022-11-12 NOTE — Progress Notes (Signed)
Martha Edwards presents today for follow up after completing radiation for lung cancer. She completed treatment on 08-18-22. Pt will have pet scan on 11-23-22.   11/23/2022 NM PET Image Restag (PS) Skull Base To Thigh  IMPRESSION: 1. Interval radiation therapy to the RIGHT lower lobe pulmonary nodule. No evidence of progression. 2. Persistent moderate metabolic activity associated with a RIGHT hilar lymph node. 3. No evidence of metastatic breast cancer. 4. No evidence of distant metastatic disease. 5. Stable focal metabolic activity in the head of the pancreas. This lesion was favored probably benign on comparison MRI. 6.  Aortic Atherosclerosis (ICD10-I70.0).  PAIN: No pain to report.   RESPIRATORY: None. Pt is on room air. No skin issues to report.  No respiratory symptoms to report.   SWALLOWING/DIET: Pt denies dysphagia . The patient eats a regular, healthy diet..  Wt Readings from Last 3 Encounters:  11/26/22 132 lb 8 oz (60.1 kg)  10/14/22 136 lb 3.2 oz (61.8 kg)  08/09/22 138 lb (62.6 kg)    OTHER:Pt denies weakness and fatigue. No major concerns or questions at this time.   There were no vitals taken for this visit.   Wt Readings from Last 3 Encounters:  10/14/22 136 lb 3.2 oz (61.8 kg)  08/09/22 138 lb (62.6 kg)  06/29/22 138 lb 9.6 oz (62.9 kg)   Vitals:   11/26/22 1407  BP: 136/76  Pulse: 80  Resp: 18  Temp: (!) 96.9 F (36.1 C)  SpO2: 97%

## 2022-11-23 ENCOUNTER — Encounter (HOSPITAL_COMMUNITY)
Admission: RE | Admit: 2022-11-23 | Discharge: 2022-11-23 | Disposition: A | Discharge status: Home / Self Care | Payer: Medicare Other | Source: Ambulatory Visit | Attending: Radiation Oncology | Admitting: Radiation Oncology

## 2022-11-23 DIAGNOSIS — C3431 Malignant neoplasm of lower lobe, right bronchus or lung: Secondary | ICD-10-CM | POA: Insufficient documentation

## 2022-11-23 LAB — GLUCOSE, CAPILLARY: Glucose-Capillary: 87 mg/dL (ref 70–99)

## 2022-11-23 MED ORDER — FLUDEOXYGLUCOSE F - 18 (FDG) INJECTION
6.4700 | Freq: Once | INTRAVENOUS | Status: AC
  Administered 2022-11-23: 6.47 via INTRAVENOUS

## 2022-11-25 ENCOUNTER — Ambulatory Visit: Payer: Medicare Other | Attending: Cardiology | Admitting: *Deleted

## 2022-11-25 DIAGNOSIS — Z5181 Encounter for therapeutic drug level monitoring: Secondary | ICD-10-CM | POA: Diagnosis not present

## 2022-11-25 DIAGNOSIS — I4892 Unspecified atrial flutter: Secondary | ICD-10-CM

## 2022-11-25 LAB — POCT INR: INR: 3 (ref 2.0–3.0)

## 2022-11-25 NOTE — Patient Instructions (Signed)
Description   Continue taking warfarin 1.5 tablets of warfarin daily except for 2 tablets on Sundays. Recheck INR in 6 weeks. [facility] [phone] or [phone]

## 2022-11-26 ENCOUNTER — Encounter: Payer: Self-pay | Admitting: Radiation Oncology

## 2022-11-26 ENCOUNTER — Ambulatory Visit
Admission: RE | Admit: 2022-11-26 | Discharge: 2022-11-26 | Disposition: A | Discharge status: Home / Self Care | Payer: Medicare Other | Source: Ambulatory Visit | Attending: Radiation Oncology | Admitting: Radiation Oncology

## 2022-11-26 VITALS — BP 136/76 | HR 80 | Temp 96.9°F | Resp 18 | Ht 69.0 in | Wt 132.5 lb

## 2022-11-26 DIAGNOSIS — C3431 Malignant neoplasm of lower lobe, right bronchus or lung: Secondary | ICD-10-CM | POA: Insufficient documentation

## 2022-11-26 DIAGNOSIS — Z923 Personal history of irradiation: Secondary | ICD-10-CM | POA: Diagnosis not present

## 2022-11-26 DIAGNOSIS — F17210 Nicotine dependence, cigarettes, uncomplicated: Secondary | ICD-10-CM | POA: Diagnosis not present

## 2022-11-26 DIAGNOSIS — Z79899 Other long term (current) drug therapy: Secondary | ICD-10-CM | POA: Diagnosis not present

## 2022-11-26 DIAGNOSIS — I700 Atherosclerosis of aorta: Secondary | ICD-10-CM | POA: Diagnosis not present

## 2022-11-26 DIAGNOSIS — Z7901 Long term (current) use of anticoagulants: Secondary | ICD-10-CM | POA: Diagnosis not present

## 2022-11-28 NOTE — Progress Notes (Signed)
Radiation Oncology         ([phone]) [phone] ________________________________  Name: Martha Edwards MRN: [id number]  Date: 11/26/2022  DOB: 07/17/1932  Follow-Up Visit Note  Outpatient  CC: [name], MD  [name], DO  Diagnosis and Prior Radiotherapy:    ICD-10-CM   1. Malignant neoplasm of bronchus of right lower lobe (HCC)  C34.31     2. Malignant neoplasm of lower lobe of right lung [facility])  C34.31 CT Chest Wo Contrast      Cancer Staging  Carcinoma of upper-inner quadrant of right female breast [facility]) Staging form: Breast, AJCC 7th Edition - Pathologic stage from 04/07/2015: Stage Unknown (T1c, NX, cM0) - Unsigned Laterality: Right - Clinical: Stage IA (T1c, N0, M0) - Signed by Lowella Dell, MD on 05/16/2015  Malignant neoplasm of lower lobe of right lung [facility]) Staging form: Lung, AJCC 8th Edition - Clinical stage from 07/02/2022: Stage IA2 (cT1b, cN0, cM0) - Signed by Lonie Peak, MD on 07/03/2022 Stage prefix: Initial diagnosis    CHIEF COMPLAINT: Here for follow-up and surveillance of lung cancer  Narrative:  The patient returns today for routine follow-up.  Ms. Gentis presents today for follow up after completing radiation for lung cancer. She completed treatment on 08-18-22. Pt will have pet scan on 11-23-22.   11/23/2022 NM PET Image Restag (PS) Skull Base To Thigh  IMPRESSION: 1. Interval radiation therapy to the RIGHT lower lobe pulmonary nodule. No evidence of progression. 2. Persistent moderate metabolic activity associated with a RIGHT hilar lymph node. 3. No evidence of metastatic breast cancer. 4. No evidence of distant metastatic disease. 5. Stable focal metabolic activity in the head of the pancreas. This lesion was favored probably benign on comparison MRI. 6.  Aortic Atherosclerosis (ICD10-I70.0).  PAIN: No pain to report.   RESPIRATORY: None. Pt is on room air. No skin issues to report.  No respiratory symptoms to report.    SWALLOWING/DIET: Pt denies dysphagia . The patient eats a regular, healthy diet..  Wt Readings from Last 3 Encounters:  11/26/22 132 lb 8 oz (60.1 kg)  10/14/22 136 lb 3.2 oz (61.8 kg)  08/09/22 138 lb (62.6 kg)    OTHER:Pt denies weakness and fatigue. No major concerns or questions at this time.                          ALLERGIES:  has No Known Allergies.  Meds: Current Outpatient Medications  Medication Sig Dispense Refill   acetaminophen (TYLENOL) 325 MG tablet Take 650 mg by mouth every 6 (six) hours as needed for mild pain.     amLODipine (NORVASC) 5 MG tablet Take 1 tablet (5 mg total) by mouth daily. (Patient taking differently: Take 5 mg by mouth daily. Takes in the afternoon per patient) 90 tablet 3   atorvastatin (LIPITOR) 40 MG tablet Take 40 mg by mouth daily.     CALCIUM PO Take 1 tablet by mouth 2 (two) times daily.     Cholecalciferol (VITAMIN D3) 25 MCG (1000 UT) CAPS Take 1,000 Units by mouth daily.     mirtazapine (REMERON) 30 MG tablet Take 60 mg by mouth at bedtime.     warfarin (COUMADIN) 4 MG tablet TAKE AS DIRECTED BY COUMADIN CLINIC **90 DAY SUPPLY (Patient taking differently: Take 4 mg by mouth See admin instructions. Take 6 mg daily at 1400<EXCEPT Sunday took 8 mg  TAKE AS DIRECTED BY COUMADIN CLINIC **90 DAY SUPPLY)  150 tablet 1   Zoster Vaccine Adjuvanted [facility]) injection Inject into the muscle. 0.5 mL 1   No current facility-administered medications for this encounter.    Physical Findings: The patient is in no acute distress. Patient is alert and oriented.  height is 5\' 9"  (1.753 m) and weight is 132 lb 8 oz (60.1 kg). Her temporal temperature is 96.9 F (36.1 C) (abnormal). Her blood pressure is 136/76 and her pulse is 80. Her respiration is 18 and oxygen saturation is 97%. .    Lungs CTAB MSK: Ambulatory Neuro non focal, fluent speech Psych: pleasant affect, alert, conversant  Lab Findings: Lab Results  Component Value Date   WBC 8.0  10/25/2022   HGB 13.2 10/25/2022   HCT 40.4 10/25/2022   MCV 89.8 10/25/2022   PLT 218 10/25/2022    Radiographic Findings: NM PET Image Restag (PS) Skull Base To Thigh  Result Date: 11/25/2022 CLINICAL DATA:  Subsequent treatment strategy for non-small cell RIGHT lung cancer. Additional history of breast cancer. Interval radiation therapy to the RIGHT lower lobe lung nodule. EXAM: NUCLEAR MEDICINE PET SKULL BASE TO THIGH TECHNIQUE: 6.47 mCi F-18 FDG was injected intravenously. Full-ring PET imaging was performed from the skull base to thigh after the radiotracer. CT data was obtained and used for attenuation correction and anatomic localization. Fasting blood glucose: 87 mg/dl COMPARISON:  FDG PET scan 07/15/2022, MRI 07/27/2022 FINDINGS: NECK: No hypermetabolic lymph nodes in the neck. Incidental CT findings: None. CHEST: RIGHT lower lobe pulmonary nodule measures 15 mm x 8 mm with SUV max equal 2.8 compared to 17 mm by 10 mm (remeasured) with SUV max equal 1.9 on comparison FDG PET scan. There is new hazy ground-glass densities surrounding the nodule related to radiation treatment Again noted moderate metabolic activity associated with a RIGHT hilar lymph node with SUV max equal 4.7 (image 74) compared to SUV max equal 3.9 on comparison FDG PET scan. Lymph node difficult to define on the noncontrast CT exam No new hypermetabolic mediastinal lymph nodes or pulmonary nodules. No abnormal metabolic activity in the breast tissue. No axillary adenopathy Incidental CT findings: None. ABDOMEN/PELVIS: Again demonstrated focal activity in the head of the pancreas with SUV max equal 4.4 compared SUV max equal 4.1. This lesion was favored probably benign on comparison MRI. No abnormal metabolic activity liver. No hypermetabolic abdominopelvic lymph nodes. Incidental CT findings: Multiple diverticula of the descending colon and sigmoid colon without acute inflammation. Uterus and adnexa unremarkable.  Atherosclerotic calcification of the aorta. SKELETON: No focal hypermetabolic activity to suggest skeletal metastasis. Incidental CT findings: None. IMPRESSION: 1. Interval radiation therapy to the RIGHT lower lobe pulmonary nodule. No evidence of progression. 2. Persistent moderate metabolic activity associated with a RIGHT hilar lymph node. 3. No evidence of metastatic breast cancer. 4. No evidence of distant metastatic disease. 5. Stable focal metabolic activity in the head of the pancreas. This lesion was favored probably benign on comparison MRI. 6.  Aortic Atherosclerosis (ICD10-I70.0). Electronically Signed   By: Genevive Bi M.D.   On: 11/25/2022 09:47    Impression/Plan:     ICD-10-CM   1. Malignant neoplasm of bronchus of right lower lobe (HCC)  C34.31     2. Malignant neoplasm of lower lobe of right lung [facility])  C34.31 CT Chest Wo Contrast     We discussed her PET results. No obvious evidence of progressive disease. I recommend a CT chest in March to for continued surveillance. Lillard Anes of survivorship will review the  results with her. I will see her back PRN.   We again discussed ways to boost caloric intake - she will try Ensure plus or Boost with extra calories.  On date of service, in total, I spent 30 minutes on this encounter. Patient was seen in person. Note signed after encounter date; minutes pertain to date of service, only.  _____________________________________   Lonie Peak, MD

## 2022-11-30 ENCOUNTER — Other Ambulatory Visit: Payer: Self-pay | Admitting: Cardiovascular Disease

## 2022-11-30 DIAGNOSIS — I480 Paroxysmal atrial fibrillation: Secondary | ICD-10-CM

## 2022-11-30 NOTE — Telephone Encounter (Signed)
Warfarin 4mg  refill [facility] Last INR 11/25/22 Last OV 08/09/22

## 2022-12-01 ENCOUNTER — Other Ambulatory Visit: Payer: Self-pay

## 2022-12-08 DIAGNOSIS — H524 Presbyopia: Secondary | ICD-10-CM | POA: Diagnosis not present

## 2022-12-08 DIAGNOSIS — H26491 Other secondary cataract, right eye: Secondary | ICD-10-CM | POA: Diagnosis not present

## 2022-12-08 DIAGNOSIS — Z961 Presence of intraocular lens: Secondary | ICD-10-CM | POA: Diagnosis not present

## 2022-12-08 DIAGNOSIS — H3500 Unspecified background retinopathy: Secondary | ICD-10-CM | POA: Diagnosis not present

## 2022-12-08 DIAGNOSIS — H35373 Puckering of macula, bilateral: Secondary | ICD-10-CM | POA: Diagnosis not present

## 2022-12-15 DIAGNOSIS — Z79899 Other long term (current) drug therapy: Secondary | ICD-10-CM | POA: Diagnosis not present

## 2022-12-15 DIAGNOSIS — Z131 Encounter for screening for diabetes mellitus: Secondary | ICD-10-CM | POA: Diagnosis not present

## 2022-12-15 DIAGNOSIS — M810 Age-related osteoporosis without current pathological fracture: Secondary | ICD-10-CM | POA: Diagnosis not present

## 2022-12-15 DIAGNOSIS — D6869 Other thrombophilia: Secondary | ICD-10-CM | POA: Diagnosis not present

## 2022-12-15 DIAGNOSIS — Z23 Encounter for immunization: Secondary | ICD-10-CM | POA: Diagnosis not present

## 2022-12-15 DIAGNOSIS — Z0000 Encounter for general adult medical examination without abnormal findings: Secondary | ICD-10-CM | POA: Diagnosis not present

## 2022-12-15 DIAGNOSIS — I272 Pulmonary hypertension, unspecified: Secondary | ICD-10-CM | POA: Diagnosis not present

## 2022-12-15 DIAGNOSIS — C3491 Malignant neoplasm of unspecified part of right bronchus or lung: Secondary | ICD-10-CM | POA: Diagnosis not present

## 2022-12-15 DIAGNOSIS — E46 Unspecified protein-calorie malnutrition: Secondary | ICD-10-CM | POA: Diagnosis not present

## 2022-12-15 DIAGNOSIS — E785 Hyperlipidemia, unspecified: Secondary | ICD-10-CM | POA: Diagnosis not present

## 2023-01-06 ENCOUNTER — Ambulatory Visit: Payer: Medicare Other | Attending: Internal Medicine | Admitting: *Deleted

## 2023-01-06 DIAGNOSIS — Z5181 Encounter for therapeutic drug level monitoring: Secondary | ICD-10-CM

## 2023-01-06 DIAGNOSIS — I4892 Unspecified atrial flutter: Secondary | ICD-10-CM

## 2023-01-06 LAB — POCT INR: INR: 1.8 — AB (ref 2.0–3.0)

## 2023-01-06 NOTE — Patient Instructions (Signed)
Description   Today take 2 tablets of warfarin then continue taking warfarin 1.5 tablets of warfarin daily except for 2 tablets on Sundays. Recheck INR in 5 weeks. [facility] [phone] or [phone]

## 2023-01-11 ENCOUNTER — Ambulatory Visit (HOSPITAL_COMMUNITY): Payer: Medicare Other | Attending: Cardiology

## 2023-01-11 DIAGNOSIS — I350 Nonrheumatic aortic (valve) stenosis: Secondary | ICD-10-CM | POA: Diagnosis not present

## 2023-01-11 LAB — ECHOCARDIOGRAM COMPLETE
AR max vel: 0.6 cm2
AV Area VTI: 0.62 cm2
AV Area mean vel: 0.55 cm2
AV Mean grad: 23.4 mm[Hg]
AV Peak grad: 38.8 mm[Hg]
Ao pk vel: 3.11 m/s
Area-P 1/2: 3.44 cm2
P 1/2 time: 419 ms
S' Lateral: 2.5 cm

## 2023-01-14 ENCOUNTER — Encounter: Payer: Self-pay | Admitting: Cardiovascular Disease

## 2023-01-14 ENCOUNTER — Ambulatory Visit: Payer: Medicare Other | Attending: Cardiovascular Disease | Admitting: Cardiovascular Disease

## 2023-01-14 VITALS — BP 112/70 | HR 59 | Ht 68.0 in | Wt 129.4 lb

## 2023-01-14 DIAGNOSIS — I350 Nonrheumatic aortic (valve) stenosis: Secondary | ICD-10-CM | POA: Insufficient documentation

## 2023-01-14 DIAGNOSIS — I10 Essential (primary) hypertension: Secondary | ICD-10-CM | POA: Diagnosis not present

## 2023-01-14 DIAGNOSIS — I4892 Unspecified atrial flutter: Secondary | ICD-10-CM | POA: Diagnosis not present

## 2023-01-14 DIAGNOSIS — E785 Hyperlipidemia, unspecified: Secondary | ICD-10-CM | POA: Diagnosis not present

## 2023-01-14 NOTE — Progress Notes (Signed)
Cardiology Office Note:    Date:  01/14/2023   ID:  Martha Edwards, DOB May 12, 1932, MRN [id number]  PCP:  [name], MD   [facility] Providers Cardiologist:  None     Referring MD: [name], MD   Chief Complaint  Patient presents with   Follow-up    Aortic Stenosis    History of Present Illness:    Martha Edwards is a 89 y.o. female presenting for follow-up of aortic stenosis.  In reviewing her records, it appears that she has had both breast cancer and lung cancer.  She has completed radiation therapy to the right lower lobe of the lung in July 2024.  She had a PET scan in October 2024 demonstrating persistent metabolic activity associated with a right hilar lymph node but no evidence of metastatic breast cancer or distant metastatic lung cancer.  She has been followed by Dr. Basilio Cairo and I reviewed her notes today.  She plans on repeating a PET scan in March of next year.  The patient is here alone today.  She reports feeling "lazy" at times, but otherwise has no complaints.  She denies chest pain or chest pressure.  She denies shortness of breath with her normal activities.  She has no orthopnea, PND, or leg swelling.  She has lost weight and has some concerns about that.  She states that her appetite is still good.  Her weight is down about 9 pounds from last year.  She denies lightheadedness, heart palpitations, or syncope.  Current Medications: Current Meds  Medication Sig   acetaminophen (TYLENOL) 325 MG tablet Take 650 mg by mouth every 6 (six) hours as needed for mild pain.   amLODipine (NORVASC) 5 MG tablet Take 1 tablet (5 mg total) by mouth daily. (Patient taking differently: Take 5 mg by mouth daily. Takes in the afternoon per patient)   atorvastatin (LIPITOR) 40 MG tablet Take 40 mg by mouth daily.   CALCIUM PO Take 1 tablet by mouth 2 (two) times daily.   Cholecalciferol (VITAMIN D3) 25 MCG (1000 UT) CAPS Take 1,000 Units by mouth daily.    mirtazapine (REMERON) 30 MG tablet Take 60 mg by mouth at bedtime.   Multiple Vitamins-Minerals (CENTRUM SILVER) tablet Take 1 tablet by mouth daily.   warfarin (COUMADIN) 4 MG tablet TAKE AS DIRECTED BY COUM[facility] CLINIC-90 DAY SUPPLY   Zoster Vaccine Adjuvanted [facility]) injection Inject into the muscle.     Allergies:   Patient has no known allergies.   ROS:   Please see the history of present illness.    All other systems reviewed and are negative.  EKGs/Labs/Other Studies Reviewed:    The following studies were reviewed today: Cardiac Studies & Procedures      ECHOCARDIOGRAM  ECHOCARDIOGRAM COMPLETE 01/11/2023  Narrative ECHOCARDIOGRAM REPORT    Patient Name:   Martha Edwards Date of Exam: 01/11/2023 Medical Rec #:  [id number]      Height:       69.0 in Accession #:    [id number]     Weight:       132.5 lb Date of Birth:  [birth year]      BSA:          1.734 m Patient Age:    89 years       BP:           127/75 mmHg Patient Gender: F              HR:  60 bpm. Exam Location:  [address]  Procedure: 2D Echo, Cardiac Doppler and Color Doppler  Indications:    I35.0 Aortic Stenosis  History:        Patient has prior history of Echocardiogram examinations, most recent 11/23/2021. Arrythmias:Atrial Fibrillation, Signs/Symptoms:Dyspnea; Risk Factors:Hypertension and Dyslipidemia.  Sonographer:    Sedonia Small Rodgers-Jones RDCS Referring Phys: [id number]  IMPRESSIONS   1. Left ventricular ejection fraction, by estimation, is 55 to 60%. The left ventricle has normal function. The left ventricle has no regional wall motion abnormalities. Left ventricular diastolic function could not be evaluated. Elevated left atrial pressure. The E/e' is 17. 2. Right ventricular systolic function is normal. The right ventricular size is normal. There is mildly elevated pulmonary artery systolic pressure. 3. Left atrial size was mildly dilated. 4. Right atrial size was  mildly dilated. 5. The mitral valve is normal in structure. Mild mitral valve regurgitation. No evidence of mitral stenosis. 6. The tricuspid valve is degenerative. Tricuspid valve regurgitation is moderate. 7. Native aortic valve, calcified, mild aortic regurgitation, severe paradoxical low-flow low gradient aortic stenosis (peak velocity 3.1 m/s, mean gradient 23 mmHg, AVA per VTI 0.6 cm, dimensional index of 0.24, stroke-volume index 26ml/m2). 8. The inferior vena cava is normal in size with greater than 50% respiratory variability, suggesting right atrial pressure of 3 mmHg.  Comparison(s): A prior study was performed on 11/23/2021. No significant change from prior study.  FINDINGS Left Ventricle: Left ventricular ejection fraction, by estimation, is 55 to 60%. The left ventricle has normal function. The left ventricle has no regional wall motion abnormalities. The left ventricular internal cavity size was normal in size. There is no left ventricular hypertrophy. Left ventricular diastolic function could not be evaluated. Elevated left atrial pressure. The E/e' is 4.  Right Ventricle: The right ventricular size is normal. Right vetricular wall thickness was not well visualized. Right ventricular systolic function is normal. There is mildly elevated pulmonary artery systolic pressure. The tricuspid regurgitant velocity is 2.96 m/s, and with an assumed right atrial pressure of 3 mmHg, the estimated right ventricular systolic pressure is 38.0 mmHg.  Left Atrium: Left atrial size was mildly dilated.  Right Atrium: Right atrial size was mildly dilated.  Pericardium: There is no evidence of pericardial effusion.  Mitral Valve: The mitral valve is normal in structure. Mild mitral valve regurgitation. No evidence of mitral valve stenosis.  Tricuspid Valve: The tricuspid valve is degenerative in appearance. Tricuspid valve regurgitation is moderate . No evidence of tricuspid stenosis.  Native  aortic valve, calcified, mild aortic regurgitation, severe paradoxical low-flow low gradient aortic stenosis (peak velocity 3.1 m/s, mean gradient 23 mmHg, AVA per VTI 0.6 cm, dimensional index of 0.24, stroke-volume index 32ml/m2). Pulmonic Valve: The pulmonic valve was normal in structure. Pulmonic valve regurgitation is mild to moderate. No evidence of pulmonic stenosis.  Aorta: The aortic root and ascending aorta are structurally normal, with no evidence of dilitation.  Venous: The inferior vena cava is normal in size with greater than 50% respiratory variability, suggesting right atrial pressure of 3 mmHg.  IAS/Shunts: There is right bowing of the interatrial septum, suggestive of elevated left atrial pressure. The atrial septum is grossly normal.   LEFT VENTRICLE PLAX 2D LVIDd:         3.90 cm   Diastology LVIDs:         2.50 cm   LV e' medial:    6.09 cm/s LV PW:  0.80 cm   LV E/e' medial:  18.1 LV IVS:        0.80 cm   LV e' lateral:   6.96 cm/s LVOT diam:     1.80 cm   LV E/e' lateral: 15.8 LV SV:         45 LV SV Index:   26 LVOT Area:     2.54 cm   RIGHT VENTRICLE             IVC RV Basal diam:  3.10 cm     IVC diam: 0.70 cm RV S prime:     12.90 cm/s TAPSE (M-mode): 2.1 cm  LEFT ATRIUM             Index        RIGHT ATRIUM           Index LA diam:        4.00 cm 2.31 cm/m   RA Area:     20.80 cm LA Vol (A2C):   54.5 ml 31.42 ml/m  RA Volume:   66.20 ml  38.17 ml/m LA Vol (A4C):   69.6 ml 40.13 ml/m LA Biplane Vol: 65.6 ml 37.82 ml/m AORTIC VALVE AV Area (Vmax):    0.60 cm AV Area (Vmean):   0.55 cm AV Area (VTI):     0.62 cm AV Vmax:           311.40 cm/s AV Vmean:          227.800 cm/s AV VTI:            0.725 m AV Peak Grad:      38.8 mmHg AV Mean Grad:      23.4 mmHg LVOT Vmax:         73.55 cm/s LVOT Vmean:        49.250 cm/s LVOT VTI:          0.177 m LVOT/AV VTI ratio: 0.24 AI PHT:            419 msec  AORTA Ao Root diam: 2.90 cm Ao  Asc diam:  2.80 cm  MITRAL VALVE                TRICUSPID VALVE MV Area (PHT): 3.44 cm     TR Peak grad:   35.0 mmHg MV Decel Time: 221 msec     TR Vmax:        296.00 cm/s MV E velocity: 110.30 cm/s MV A velocity: 116.00 cm/s  SHUNTS MV E/A ratio:  0.95         Systemic VTI:  0.18 m Systemic Diam: 1.80 cm  Sunit Tolia Electronically signed by Tessa Lerner Signature Date/Time: 01/11/2023/5:47:45 PM    Final             EKG:   EKG Interpretation Date/Time:  Friday January 14 2023 10:44:37 EST Ventricular Rate:  59 PR Interval:  194 QRS Duration:  84 QT Interval:  448 QTC Calculation: 443 R Axis:   70  Text Interpretation: Sinus bradycardia with frequent Premature ventricular complexes Abnormal QRS-T angle, consider primary T wave abnormality When compared with ECG of 01-Apr-2015 16:09, Premature ventricular complexes are now Present Non-specific change in ST segment in Inferior leads Nonspecific T wave abnormality now evident in Inferior leads Confirmed by Tonny Bollman [phone]) on 01/14/2023 11:02:05 AM    Recent Labs: 10/25/2022: ALT 10; BUN 14; Creatinine 1.22; Hemoglobin 13.2; Platelet Count 218; Potassium 4.6; Sodium 144  Recent Lipid Panel  No results found for: "CHOL", "TRIG", "HDL", "CHOLHDL", "VLDL", "LDLCALC", "LDLDIRECT"   Risk Assessment/Calculations:    CHA2DS2-VASc Score = 5   This indicates a 7.2% annual risk of stroke. The patient's score is based upon: CHF History: 0 HTN History: 1 Diabetes History: 0 Stroke History: 0 Vascular Disease History: 1 Age Score: 2 Gender Score: 1               Physical Exam:    VS:  BP 112/70   Pulse (!) 59   Ht 5\' 8"  (1.727 m)   Wt 129 lb 6.4 oz (58.7 kg)   SpO2 94%   BMI 19.68 kg/m     Wt Readings from Last 3 Encounters:  01/14/23 129 lb 6.4 oz (58.7 kg)  11/26/22 132 lb 8 oz (60.1 kg)  10/14/22 136 lb 3.2 oz (61.8 kg)     GEN:  Well nourished, well developed in no acute distress HEENT:  Normal NECK: No JVD; No carotid bruits LYMPHATICS: No lymphadenopathy CARDIAC: RRR, 2/6 harsh crescendo decrescendo murmur at the right upper sternal border, mid peaking, A2 present RESPIRATORY:  Clear to auscultation without rales, wheezing or rhonchi  ABDOMEN: Soft, non-tender, non-distended MUSCULOSKELETAL:  No edema; No deformity  SKIN: Warm and dry NEUROLOGIC:  Alert and oriented x 3 PSYCHIATRIC:  Normal affect   Assessment & Plan Nonrheumatic aortic valve stenosis The patient's recent echo is reviewed and discussed with her at length.  She now meets criteria for severe paradoxical low-flow low gradient aortic stenosis.  Her exam still suggest moderate aortic stenosis and she really does not have any symptoms.  She has lost weight and has undergone radiation treatment for lung cancer.  She has a follow-up PET scan in March of next year.  After shared decision making conversation, she prefers to defer further workup for aortic valve intervention at this time.  I discussed the natural history of aortic stenosis with her today.  We discussed the importance of watching out for signs of chest discomfort, lightheadedness, progressive fatigue, or shortness of breath.  Otherwise I will see her back in 6 months with a repeat echocardiogram prior to that visit. Paroxysmal atrial flutter (HCC) She continues on anticoagulation with warfarin.  Appears to be maintaining sinus rhythm. Essential hypertension Blood pressure is well-controlled on amlodipine. Hyperlipidemia LDL goal <70 Treated with atorvastatin 40 mg daily.            Medication Adjustments/Labs and Tests Ordered: Current medicines are reviewed at length with the patient today.  Concerns regarding medicines are outlined above.  Orders Placed This Encounter  Procedures   EKG 12-Lead   ECHOCARDIOGRAM COMPLETE   No orders of the defined types were placed in this encounter.   Patient Instructions  Testing/Procedures: ECHO in 6  months (prior to visit) Your physician has requested that you have an echocardiogram. Echocardiography is a painless test that uses sound waves to create images of your heart. It provides your doctor with information about the size and shape of your heart and how well your heart's chambers and valves are working. This procedure takes approximately one hour. There are no restrictions for this procedure. Please do NOT wear cologne, perfume, aftershave, or lotions (deodorant is allowed). Please arrive 15 minutes prior to your appointment time.  Please note: We ask at that you not bring children with you during ultrasound (echo/ vascular) testing. Due to room size and safety concerns, children are not allowed in the ultrasound rooms during exams. Our front  office staff cannot provide observation of children in our lobby area while testing is being conducted. An adult accompanying a patient to their appointment will only be allowed in the ultrasound room at the discretion of the ultrasound technician under special circumstances. We apologize for any inconvenience.  Follow-Up: At [facility], you and your health needs are our priority.  As part of our continuing mission to provide you with exceptional heart care, we have created designated Provider Care Teams.  These Care Teams include your primary Cardiologist (physician) and Advanced Practice Providers (APPs -  Physician Assistants and Nurse Practitioners) who all work together to provide you with the care you need, when you need it.  Your next appointment:   6 month(s)  Provider:   Tonny Bollman, MD     Signed, Tonny Bollman, MD  01/14/2023 11:52 AM    [facility]

## 2023-01-14 NOTE — Patient Instructions (Signed)
Testing/Procedures: ECHO in 6 months (prior to visit) Your physician has requested that you have an echocardiogram. Echocardiography is a painless test that uses sound waves to create images of your heart. It provides your doctor with information about the size and shape of your heart and how well your heart's chambers and valves are working. This procedure takes approximately one hour. There are no restrictions for this procedure. Please do NOT wear cologne, perfume, aftershave, or lotions (deodorant is allowed). Please arrive 15 minutes prior to your appointment time.  Please note: We ask at that you not bring children with you during ultrasound (echo/ vascular) testing. Due to room size and safety concerns, children are not allowed in the ultrasound rooms during exams. Our front office staff cannot provide observation of children in our lobby area while testing is being conducted. An adult accompanying a patient to their appointment will only be allowed in the ultrasound room at the discretion of the ultrasound technician under special circumstances. We apologize for any inconvenience.  Follow-Up: At [facility], you and your health needs are our priority.  As part of our continuing mission to provide you with exceptional heart care, we have created designated Provider Care Teams.  These Care Teams include your primary Cardiologist (physician) and Advanced Practice Providers (APPs -  Physician Assistants and Nurse Practitioners) who all work together to provide you with the care you need, when you need it.  Your next appointment:   6 month(s)  Provider:   Tonny Bollman, MD

## 2023-01-14 NOTE — Assessment & Plan Note (Signed)
She continues on anticoagulation with warfarin.  Appears to be maintaining sinus rhythm.

## 2023-01-14 NOTE — Assessment & Plan Note (Signed)
Blood pressure is well controlled on amlodipine.

## 2023-01-17 ENCOUNTER — Telehealth: Payer: Self-pay | Admitting: Cardiovascular Disease

## 2023-01-17 ENCOUNTER — Encounter: Payer: Self-pay | Admitting: Cardiovascular Disease

## 2023-01-17 NOTE — Telephone Encounter (Signed)
Patient's son is calling in regards to recent visit patient had with Dr. Excell Seltzer.  Left message on MyChart:    "Yes my name is Martha Edwards I'M the son of Martha Edwards .I was trying to get in touch with Leotis Shames, DR.COOPERS nurse if you could give me a call about my mother's recent visit. My # is [phone] cell or [phone] home .               Thanks"  Requesting call back to discuss further.

## 2023-01-17 NOTE — Telephone Encounter (Signed)
I spoke with the pt's son, Darryl Day, in regards to the patient's appointment.  The pt came to her appointment with Dr Excell Seltzer by herself and her son sat in the car.  The patient could not explain what she and Dr Excell Seltzer discussed at her visit and then Mr Day read her office note in my chart.  Mr Day is concerned because the patient doesn't understand her medical condition and he would like to schedule another appointment with Dr Excell Seltzer so that the patient, son and daughter can all be involved in her care.  Appointment scheduled on 02/08/2023.

## 2023-02-08 ENCOUNTER — Ambulatory Visit: Payer: Medicare Other | Attending: Cardiovascular Disease | Admitting: Cardiovascular Disease

## 2023-02-08 ENCOUNTER — Encounter: Payer: Self-pay | Admitting: Cardiovascular Disease

## 2023-02-08 VITALS — BP 140/82 | HR 56 | Ht 68.0 in | Wt 129.4 lb

## 2023-02-08 DIAGNOSIS — I350 Nonrheumatic aortic (valve) stenosis: Secondary | ICD-10-CM | POA: Diagnosis not present

## 2023-02-08 NOTE — Progress Notes (Signed)
 Cardiology Office Note:    Date:  02/08/2023   ID:  Winton Day Putzier, DOB October 26, 1932, MRN [id number]  PCP:  Verena Mems, MD   [facility] Providers Cardiologist:  None     Referring MD: Verena Mems, MD   Chief Complaint  Patient presents with   Follow-up    Aortic Stenosis    History of Present Illness:    Martha Edwards is a 89 y.o. female presenting for follow-up of aortic stenosis.  I just recently saw her on January 14, 2023 for evaluation of severe paradoxical low-flow low gradient aortic stenosis.  The patient is elderly at 89 years old and she came to the office alone for that visit.  In reviewing her history, she has had both breast cancer and lung cancer.  She completed radiation therapy to the right lower lobe of the lung in July 2024 then had a PET scan for follow-up in October 2024 demonstrating persistent metabolic activity in the right hilar lymph nodes but no evidence for metastatic breast cancer or distant metastatic lung cancer.  She has a follow-up PET scan and radiation oncology visit in March of this year.  We discussed her aortic stenosis in this context and decided to follow-up with her in 6 months because of only mild symptoms.  The patient reports no change in her symptoms since her recent evaluation.  Her son and daughter are here today and they describe her as having weak legs and demonstrating some shortness of breath with walking.  She has not had chest pain, chest pressure, lightheadedness, syncope, or heart palpitations.   Current Medications: Current Meds  Medication Sig   acetaminophen  (TYLENOL ) 325 MG tablet Take 650 mg by mouth every 6 (six) hours as needed for mild pain.   amLODipine  (NORVASC ) 5 MG tablet Take 1 tablet (5 mg total) by mouth daily. (Patient taking differently: Take 5 mg by mouth daily. Takes in the afternoon per patient)   atorvastatin (LIPITOR) 40 MG tablet Take 40 mg by mouth daily.   CALCIUM PO Take 1 tablet by mouth  2 (two) times daily.   Cholecalciferol (VITAMIN D3) 25 MCG (1000 UT) CAPS Take 1,000 Units by mouth daily.   mirtazapine (REMERON) 30 MG tablet Take 60 mg by mouth at bedtime.   Multiple Vitamins-Minerals (CENTRUM SILVER) tablet Take 1 tablet by mouth daily.   warfarin (COUMADIN ) 4 MG tablet TAKE AS DIRECTED BY COUMADIN  CLINIC-90 DAY SUPPLY   [DISCONTINUED] Zoster Vaccine Adjuvanted (SHINGRIX ) injection Inject into the muscle.     Allergies:   Patient has no known allergies.   ROS:   Please see the history of present illness.    All other systems reviewed and are negative.  EKGs/Labs/Other Studies Reviewed:    The following studies were reviewed today: Cardiac Studies & Procedures      ECHOCARDIOGRAM  ECHOCARDIOGRAM COMPLETE 01/11/2023  Narrative ECHOCARDIOGRAM REPORT    Patient Name:   Martha Edwards Date of Exam: 01/11/2023 Medical Rec #:  [id number]      Height:       69.0 in Accession #:    [id number]     Weight:       132.5 lb Date of Birth:  07/09/1932      BSA:          1.734 m Patient Age:    89 years       BP:           127/75 mmHg Patient Gender:  F              HR:           60 bpm. Exam Location:  [address]  Procedure: 2D Echo, Cardiac Doppler and Color Doppler  Indications:    I35.0 Aortic Stenosis  History:        Patient has prior history of Echocardiogram examinations, most recent 11/23/2021. Arrythmias:Atrial Fibrillation, Signs/Symptoms:Dyspnea; Risk Factors:Hypertension and Dyslipidemia.  Sonographer:    Carl Rodgers-Jones RDCS Referring Phys: [id number] Maalik Pinn  IMPRESSIONS   1. Left ventricular ejection fraction, by estimation, is 55 to 60%. The left ventricle has normal function. The left ventricle has no regional wall motion abnormalities. Left ventricular diastolic function could not be evaluated. Elevated left atrial pressure. The E/e' is 17. 2. Right ventricular systolic function is normal. The right ventricular size is normal.  There is mildly elevated pulmonary artery systolic pressure. 3. Left atrial size was mildly dilated. 4. Right atrial size was mildly dilated. 5. The mitral valve is normal in structure. Mild mitral valve regurgitation. No evidence of mitral stenosis. 6. The tricuspid valve is degenerative. Tricuspid valve regurgitation is moderate. 7. Native aortic valve, calcified, mild aortic regurgitation, severe paradoxical low-flow low gradient aortic stenosis (peak velocity 3.1 m/s, mean gradient 23 mmHg, AVA per VTI 0.6 cm, dimensional index of 0.24, stroke-volume index 62ml/m2). 8. The inferior vena cava is normal in size with greater than 50% respiratory variability, suggesting right atrial pressure of 3 mmHg.  Comparison(s): A prior study was performed on 11/23/2021. No significant change from prior study.  FINDINGS Left Ventricle: Left ventricular ejection fraction, by estimation, is 55 to 60%. The left ventricle has normal function. The left ventricle has no regional wall motion abnormalities. The left ventricular internal cavity size was normal in size. There is no left ventricular hypertrophy. Left ventricular diastolic function could not be evaluated. Elevated left atrial pressure. The E/e' is 63.  Right Ventricle: The right ventricular size is normal. Right vetricular wall thickness was not well visualized. Right ventricular systolic function is normal. There is mildly elevated pulmonary artery systolic pressure. The tricuspid regurgitant velocity is 2.96 m/s, and with an assumed right atrial pressure of 3 mmHg, the estimated right ventricular systolic pressure is 38.0 mmHg.  Left Atrium: Left atrial size was mildly dilated.  Right Atrium: Right atrial size was mildly dilated.  Pericardium: There is no evidence of pericardial effusion.  Mitral Valve: The mitral valve is normal in structure. Mild mitral valve regurgitation. No evidence of mitral valve stenosis.  Tricuspid Valve: The tricuspid  valve is degenerative in appearance. Tricuspid valve regurgitation is moderate . No evidence of tricuspid stenosis.  Native aortic valve, calcified, mild aortic regurgitation, severe paradoxical low-flow low gradient aortic stenosis (peak velocity 3.1 m/s, mean gradient 23 mmHg, AVA per VTI 0.6 cm, dimensional index of 0.24, stroke-volume index 98ml/m2). Pulmonic Valve: The pulmonic valve was normal in structure. Pulmonic valve regurgitation is mild to moderate. No evidence of pulmonic stenosis.  Aorta: The aortic root and ascending aorta are structurally normal, with no evidence of dilitation.  Venous: The inferior vena cava is normal in size with greater than 50% respiratory variability, suggesting right atrial pressure of 3 mmHg.  IAS/Shunts: There is right bowing of the interatrial septum, suggestive of elevated left atrial pressure. The atrial septum is grossly normal.   LEFT VENTRICLE PLAX 2D LVIDd:         3.90 cm   Diastology LVIDs:  2.50 cm   LV e' medial:    6.09 cm/s LV PW:         0.80 cm   LV E/e' medial:  18.1 LV IVS:        0.80 cm   LV e' lateral:   6.96 cm/s LVOT diam:     1.80 cm   LV E/e' lateral: 15.8 LV SV:         45 LV SV Index:   26 LVOT Area:     2.54 cm   RIGHT VENTRICLE             IVC RV Basal diam:  3.10 cm     IVC diam: 0.70 cm RV S prime:     12.90 cm/s TAPSE (M-mode): 2.1 cm  LEFT ATRIUM             Index        RIGHT ATRIUM           Index LA diam:        4.00 cm 2.31 cm/m   RA Area:     20.80 cm LA Vol (A2C):   54.5 ml 31.42 ml/m  RA Volume:   66.20 ml  38.17 ml/m LA Vol (A4C):   69.6 ml 40.13 ml/m LA Biplane Vol: 65.6 ml 37.82 ml/m AORTIC VALVE AV Area (Vmax):    0.60 cm AV Area (Vmean):   0.55 cm AV Area (VTI):     0.62 cm AV Vmax:           311.40 cm/s AV Vmean:          227.800 cm/s AV VTI:            0.725 m AV Peak Grad:      38.8 mmHg AV Mean Grad:      23.4 mmHg LVOT Vmax:         73.55 cm/s LVOT Vmean:         49.250 cm/s LVOT VTI:          0.177 m LVOT/AV VTI ratio: 0.24 AI PHT:            419 msec  AORTA Ao Root diam: 2.90 cm Ao Asc diam:  2.80 cm  MITRAL VALVE                TRICUSPID VALVE MV Area (PHT): 3.44 cm     TR Peak grad:   35.0 mmHg MV Decel Time: 221 msec     TR Vmax:        296.00 cm/s MV E velocity: 110.30 cm/s MV A velocity: 116.00 cm/s  SHUNTS MV E/A ratio:  0.95         Systemic VTI:  0.18 m Systemic Diam: 1.80 cm  Sunit Tolia Electronically signed by Madonna Large Signature Date/Time: 01/11/2023/5:47:45 PM    Final             EKG:        Recent Labs: 10/25/2022: ALT 10; BUN 14; Creatinine 1.22; Hemoglobin 13.2; Platelet Count 218; Potassium 4.6; Sodium 144  Recent Lipid Panel No results found for: CHOL, TRIG, HDL, CHOLHDL, VLDL, LDLCALC, LDLDIRECT         Physical Exam:    VS:  BP (!) 140/82   Pulse (!) 56   Ht 5' 8 (1.727 m)   Wt 129 lb 6.4 oz (58.7 kg)   SpO2 96%   BMI 19.68 kg/m     Wt Readings from Last  3 Encounters:  02/08/23 129 lb 6.4 oz (58.7 kg)  01/14/23 129 lb 6.4 oz (58.7 kg)  11/26/22 132 lb 8 oz (60.1 kg)     GEN: Thin, elderly woman in no acute distress HEENT: Normal NECK: No JVD; No carotid bruits LYMPHATICS: No lymphadenopathy CARDIAC: Regular rate and rhythm with 3/6 mid peaking crescendo decrescendo murmur at the right upper sternal border with A2 present. RESPIRATORY:  Clear to auscultation without rales, wheezing or rhonchi  ABDOMEN: Soft, non-tender, non-distended MUSCULOSKELETAL:  No edema; No deformity  SKIN: Warm and dry NEUROLOGIC:  Alert and oriented x 3 PSYCHIATRIC:  Normal affect   Assessment & Plan Nonrheumatic aortic valve stenosis I discussed the natural history of aortic stenosis with the patient, her son, and her daughter who are all present today for her evaluation.  Her physical exam remains consistent with moderate aortic stenosis, but her recent echocardiogram suggest severe  paradoxical low-flow low gradient aortic stenosis.  She has [place]  Heart Association functional class II symptoms of fatigue and exertional dyspnea which are stable.  Her evaluation is complicated by her advanced age and presence of lung cancer status post radiation therapy with residual metabolic activity by most recent PET scan.  As detailed above, she has a follow-up PET scan scheduled in March of this year.  I reviewed treatment options for the patient's aortic stenosis today.  We discussed consideration of TAVR.  They understand that we would need to perform cardiac catheterization as well as CT angiography studies of the heart as well as the chest, abdomen, and pelvis to see if she is a candidate for TAVR.  In the meantime, the patient will work on her nutritional status is much as possible.  She will do some low-level physical exercise to try to maintain her strength.  I will plan on seeing her back in follow-up after her next scheduled echocardiogram in May of this year.  That will give her an opportunity to follow-up with radiation oncology and have her follow-up PET scan completed.  If things are stable with respect to her cancers, we could consider moving forward with TAVR evaluation at the time of her next office visit.  She is counseled about the cardinal signs and symptoms of worsening aortic stenosis which include progressive shortness of breath, chest discomfort, worsening fatigue, or development of heart failure symptoms.  As long as she remains clinically stable, I will see her back in follow-up after her surveillance echocardiogram.             Medication Adjustments/Labs and Tests Ordered: Current medicines are reviewed at length with the patient today.  Concerns regarding medicines are outlined above.  No orders of the defined types were placed in this encounter.  No orders of the defined types were placed in this encounter.   Patient Instructions  Follow-Up: At [facility], you and your health needs are our priority.  As part of our continuing mission to provide you with exceptional heart care, we have created designated Provider Care Teams.  These Care Teams include your primary Cardiologist (physician) and Advanced Practice Providers (APPs -  Physician Assistants and Nurse Practitioners) who all work together to provide you with the care you need, when you need it.  We recommend signing up for the patient portal called MyChart.  Sign up information is provided on this After Visit Summary.  MyChart is used to connect with patients for Virtual Visits (Telemedicine).  Patients are able to view lab/test results,  encounter notes, upcoming appointments, etc.  Non-urgent messages can be sent to your provider as well.   To learn more about what you can do with MyChart, go to [email or web address].    Your next appointment:   Follow up after Echo  Provider:   Ozell Fell, MD    Signed, Ozell Fell, MD  02/08/2023 1:43 PM     [facility]

## 2023-02-08 NOTE — Patient Instructions (Signed)
 Follow-Up: At [facility], you and your health needs are our priority.  As part of our continuing mission to provide you with exceptional heart care, we have created designated Provider Care Teams.  These Care Teams include your primary Cardiologist (physician) and Advanced Practice Providers (APPs -  Physician Assistants and Nurse Practitioners) who all work together to provide you with the care you need, when you need it.  We recommend signing up for the patient portal called MyChart.  Sign up information is provided on this After Visit Summary.  MyChart is used to connect with patients for Virtual Visits (Telemedicine).  Patients are able to view lab/test results, encounter notes, upcoming appointments, etc.  Non-urgent messages can be sent to your provider as well.   To learn more about what you can do with MyChart, go to [email or web address].    Your next appointment:   Follow up after Echo  Provider:   Ozell Fell, MD

## 2023-02-10 ENCOUNTER — Ambulatory Visit: Payer: Medicare Other | Attending: Cardiology

## 2023-02-10 ENCOUNTER — Encounter: Payer: Self-pay | Admitting: Adult Health

## 2023-02-10 DIAGNOSIS — Z5181 Encounter for therapeutic drug level monitoring: Secondary | ICD-10-CM | POA: Insufficient documentation

## 2023-02-10 LAB — POCT INR: INR: 2.6 (ref 2.0–3.0)

## 2023-02-10 NOTE — Patient Instructions (Signed)
 Description   Continue taking warfarin 1.5 tablets of warfarin daily except for 2 tablets on Sundays.  Recheck INR in 6 weeks.  [facility] [phone]

## 2023-02-13 ENCOUNTER — Encounter: Payer: Self-pay | Admitting: Adult Health

## 2023-03-11 ENCOUNTER — Other Ambulatory Visit: Payer: Self-pay

## 2023-03-11 MED ORDER — AMLODIPINE BESYLATE 5 MG PO TABS: 5.0000 mg | ORAL_TABLET | Freq: Every day | ORAL | 3 refills | Status: DC

## 2023-03-12 ENCOUNTER — Other Ambulatory Visit: Payer: Self-pay | Admitting: Cardiovascular Disease

## 2023-03-12 DIAGNOSIS — I480 Paroxysmal atrial fibrillation: Secondary | ICD-10-CM

## 2023-03-24 ENCOUNTER — Ambulatory Visit: Payer: Medicare Other

## 2023-03-31 ENCOUNTER — Ambulatory Visit: Payer: Medicare Other | Attending: Cardiology

## 2023-03-31 DIAGNOSIS — Z5181 Encounter for therapeutic drug level monitoring: Secondary | ICD-10-CM | POA: Diagnosis not present

## 2023-03-31 LAB — POCT INR: INR: 2.3 (ref 2.0–3.0)

## 2023-03-31 NOTE — Patient Instructions (Signed)
 Description   Continue taking warfarin 1.5 tablets of warfarin daily except for 2 tablets on Sundays.  Recheck INR in 7 weeks.  [facility] [phone]

## 2023-04-06 NOTE — Progress Notes (Signed)
 Ms. Martha Edwards presents to the clinic for a follow up to receive CT scan results from 04/08/2023. She completed radiation treatment for lung cancer on 08/18/2022.   PAIN: Patient denies any pain, shortness of breath of cough.   RESPIRATORY: None. Pt is on room air. Noted Skin is normal, clean, dry and intact. None  SWALLOWING/DIET: Pt denies dysphagia nothing has become stuck, but feels a lump sensation when swallowing. The patient eats a regular, healthy diet..  OTHER: Patient denies  There were no vitals taken for this visit. BP 129/85 (BP Location: Right Arm, Patient Position: Sitting, Cuff Size: Normal)   Pulse 78   Temp 97.6 F (36.4 C)   Resp 20   Ht 5\' 8"  (1.727 m)   Wt 129 lb (58.5 kg)   SpO2 98%   BMI 19.61 kg/m     Wt Readings from Last 3 Encounters:  02/08/23 129 lb 6.4 oz (58.7 kg)  01/14/23 129 lb 6.4 oz (58.7 kg)  11/26/22 132 lb 8 oz (60.1 kg)

## 2023-04-08 ENCOUNTER — Encounter: Payer: Self-pay | Admitting: Adult Health

## 2023-04-08 ENCOUNTER — Ambulatory Visit (HOSPITAL_COMMUNITY)
Admission: RE | Admit: 2023-04-08 | Discharge: 2023-04-08 | Disposition: A | Discharge status: Home / Self Care | Payer: Medicare Other | Source: Ambulatory Visit | Attending: Radiation Oncology | Admitting: Radiation Oncology

## 2023-04-08 ENCOUNTER — Encounter (HOSPITAL_COMMUNITY): Payer: Self-pay

## 2023-04-08 DIAGNOSIS — C3431 Malignant neoplasm of lower lobe, right bronchus or lung: Secondary | ICD-10-CM | POA: Diagnosis not present

## 2023-04-08 DIAGNOSIS — C3490 Malignant neoplasm of unspecified part of unspecified bronchus or lung: Secondary | ICD-10-CM | POA: Diagnosis not present

## 2023-04-08 DIAGNOSIS — J439 Emphysema, unspecified: Secondary | ICD-10-CM | POA: Diagnosis not present

## 2023-04-08 DIAGNOSIS — I700 Atherosclerosis of aorta: Secondary | ICD-10-CM | POA: Diagnosis not present

## 2023-04-11 ENCOUNTER — Inpatient Hospital Stay: Payer: Medicare Other | Attending: Adult Health | Admitting: Adult Health

## 2023-04-11 ENCOUNTER — Encounter: Payer: Self-pay | Admitting: Adult Health

## 2023-04-11 VITALS — BP 150/63 | HR 63 | Temp 98.6°F | Resp 18 | Ht 68.0 in | Wt 131.7 lb

## 2023-04-11 DIAGNOSIS — C50211 Malignant neoplasm of upper-inner quadrant of right female breast: Secondary | ICD-10-CM | POA: Insufficient documentation

## 2023-04-11 DIAGNOSIS — C3431 Malignant neoplasm of lower lobe, right bronchus or lung: Secondary | ICD-10-CM | POA: Insufficient documentation

## 2023-04-11 DIAGNOSIS — Z87891 Personal history of nicotine dependence: Secondary | ICD-10-CM | POA: Diagnosis not present

## 2023-04-11 DIAGNOSIS — E7800 Pure hypercholesterolemia, unspecified: Secondary | ICD-10-CM | POA: Insufficient documentation

## 2023-04-11 DIAGNOSIS — Z8042 Family history of malignant neoplasm of prostate: Secondary | ICD-10-CM | POA: Diagnosis not present

## 2023-04-11 DIAGNOSIS — N3281 Overactive bladder: Secondary | ICD-10-CM | POA: Insufficient documentation

## 2023-04-11 DIAGNOSIS — I10 Essential (primary) hypertension: Secondary | ICD-10-CM | POA: Insufficient documentation

## 2023-04-11 DIAGNOSIS — N182 Chronic kidney disease, stage 2 (mild): Secondary | ICD-10-CM | POA: Diagnosis not present

## 2023-04-11 DIAGNOSIS — Z803 Family history of malignant neoplasm of breast: Secondary | ICD-10-CM | POA: Diagnosis not present

## 2023-04-11 DIAGNOSIS — Z79811 Long term (current) use of aromatase inhibitors: Secondary | ICD-10-CM | POA: Insufficient documentation

## 2023-04-11 DIAGNOSIS — Z170 Estrogen receptor positive status [ER+]: Secondary | ICD-10-CM | POA: Insufficient documentation

## 2023-04-11 DIAGNOSIS — Z7901 Long term (current) use of anticoagulants: Secondary | ICD-10-CM | POA: Diagnosis not present

## 2023-04-11 DIAGNOSIS — M8580 Other specified disorders of bone density and structure, unspecified site: Secondary | ICD-10-CM | POA: Diagnosis not present

## 2023-04-11 DIAGNOSIS — M810 Age-related osteoporosis without current pathological fracture: Secondary | ICD-10-CM | POA: Insufficient documentation

## 2023-04-11 DIAGNOSIS — Z923 Personal history of irradiation: Secondary | ICD-10-CM | POA: Diagnosis not present

## 2023-04-11 DIAGNOSIS — I129 Hypertensive chronic kidney disease with stage 1 through stage 4 chronic kidney disease, or unspecified chronic kidney disease: Secondary | ICD-10-CM | POA: Insufficient documentation

## 2023-04-11 NOTE — Progress Notes (Unsigned)
 [facility] Cancer Follow up:    [name], MD [address] [place] [address]   DIAGNOSIS: Cancer Staging  Carcinoma of upper-inner quadrant of right female breast [facility]) Staging form: Breast, AJCC 7th Edition - Pathologic stage from 04/07/2015: Stage Unknown (T1c, NX, cM0) - Unsigned Laterality: Right - Clinical: Stage IA (T1c, N0, M0) - Signed by Lowella Dell, MD on 05/16/2015  Malignant neoplasm of lower lobe of right lung [facility]) Staging form: Lung, AJCC 8th Edition - Clinical stage from 07/02/2022: Stage IA2 (cT1b, cN0, cM0) - Signed by Lonie Peak, MD on 07/03/2022 Stage prefix: Initial diagnosis   SUMMARY OF ONCOLOGIC HISTORY: Oncology History  Carcinoma of upper-inner quadrant of right female breast (HCC)  01/20/2015 Mammogram   In the right breast, a possible mass warranting further evaluation. In the left breast, no findings suspicious for malignancy   02/19/2015 Breast US   Right breast: irregularly marginated mass with increased vascularity located at 12:30 o'clock position 4 cm from the nipple measuring 1.3 x 1.3 x 1.0 cm in size   02/27/2015 Initial Biopsy   Right breast core needle bx: Invasive ductal carcinoma, grade 1-2, ER+ (100%), PR+ (90%), HER2/neu negative (ratio 1.15), Ki6710%   02/27/2015 Clinical Stage   Stage IA: T1c No   04/07/2015 Definitive Surgery   Invasive ductal carcinoma, grade 1, ER+, PR+, HER2/neu negative, Ki67 10%   04/07/2015 Pathologic Stage   Stage IA: pT1c pNx    Radiation Therapy   pt declined   05/16/2015 - 05/2020 Anti-estrogen oral therapy   Anastrozole 1 mg daily.   05/21/2015 Survivorship   Survivorship care plan completed and mailed to patient in lieu of in person visit at her request   Malignant neoplasm of lower lobe of right lung (HCC)  07/02/2022 Cancer Staging   Staging form: Lung, AJCC 8th Edition - Clinical stage from 07/02/2022: Stage IA2 (cT1b, cN0, cM0) - Signed by Lonie Peak, MD on 07/03/2022 Stage prefix: Initial diagnosis   07/03/2022 Initial Diagnosis   Malignant neoplasm of lower lobe of right lung (HCC)     CURRENT THERAPY:  INTERVAL HISTORY:  Discussed the use of AI scribe software for clinical note transcription with the patient, who gave verbal consent to proceed.  Martha Edwards 89 y.o. female returns for    Patient Active Problem List   Diagnosis Date Noted   Osteoporosis 10/14/2022   Malignant neoplasm of lower lobe of right lung (HCC) 07/03/2022   Lung nodule 06/10/2022   Genetic testing 09/20/2017   Family history of breast cancer    Family history of prostate cancer    Encounter for therapeutic drug monitoring 05/02/2017   Osteopenia determined by x-ray 04/20/2016   Carcinoma of upper-inner quadrant of right female breast (HCC) 05/16/2015   Tachy-brady syndrome (HCC) 10/17/2014   Paroxysmal atrial flutter (HCC) 08/30/2013   Chronic anticoagulation 08/30/2013   Essential hypertension 08/30/2013    has no known allergies.  MEDICAL HISTORY: Past Medical History:  Diagnosis Date   Anticoagulated    Anxiety    Arthritis of hand    Atrial flutter (HCC)    with LVEF 60%, LVH, LAE and holter with rate control and Ave HR 60 bpm    Breast cancer (HCC) 2017    RIGHT BREAST CA   Carotid artery plaque    mild bilateral carotid plaque without significant stenosis (<20% in left ICA), 04/2010   CKD (chronic kidney disease) stage 2, GFR 60-89  ml/min    Pt states she does have this   Diverticula, colon    Dyspnea    Family history of breast cancer    Family history of prostate cancer    Hypercholesterolemia    Hypertension    with LVH on echo 06/03/11   Hypertonicity of bladder    Insomnia    Mitral regurgitation    Non-small cell lung cancer (NSCLC) (HCC) 07/2022   XRT comp   Osteopenia    > osteoporosis   Overactive bladder     SURGICAL HISTORY: Past Surgical History:  Procedure Laterality Date   BREAST BIOPSY      BREAST LUMPECTOMY Right    2017   BREAST LUMPECTOMY WITH RADIOACTIVE SEED LOCALIZATION Right 04/07/2015   Procedure: RIGHT BREAST LUMPECTOMY WITH RADIOACTIVE SEED LOCALIZATION;  Surgeon: Glenna Fellows, MD;  Location: [facility];  Service: General;  Laterality: Right;   BRONCHIAL BIOPSY  06/22/2022   Procedure: BRONCHIAL BIOPSIES;  Surgeon: [name], DO;  Location: [facility];  Service: Pulmonary;;   BRONCHIAL NEEDLE ASPIRATION BIOPSY  06/22/2022   Procedure: BRONCHIAL NEEDLE ASPIRATION BIOPSIES;  Surgeon: [name], DO;  Location: [facility];  Service: Pulmonary;;   CATARACT EXTRACTION W/ INTRAOCULAR LENS  IMPLANT, BILATERAL Bilateral    2017-2018   COLONOSCOPY  2001   2007 sigmoidoscopy     SOCIAL HISTORY: Social History   Socioeconomic History   Marital status: Single    Spouse name: Not on file   Number of children: Not on file   Years of education: Not on file   Highest education level: Not on file  Occupational History   Not on file  Tobacco Use   Smoking status: Former    Current packs/day: 0.00    Types: Cigarettes    Quit date: 02/02/1984    Years since quitting: 39.2   Smokeless tobacco: Never  Substance and Sexual Activity   Alcohol use: Not Currently   Drug use: Not on file   Sexual activity: Never  Other Topics Concern   Not on file  Social History Narrative   Not on file   Social Drivers of Health   Financial Resource Strain: Not on file  Food Insecurity: No Food Insecurity (07/01/2022)   Hunger Vital Sign    Worried About Running Out of Food in the Last Year: Never true    Ran Out of Food in the Last Year: Never true  Transportation Needs: No Transportation Needs (07/01/2022)   PRAPARE - Administrator, Civil Service (Medical): No    Lack of Transportation (Non-Medical): No  Physical Activity: Not on file  Stress: Not on file  Social Connections: Not on file  Intimate Partner Violence: Not At Risk  (07/01/2022)   Humiliation, Afraid, Rape, and Kick questionnaire    Fear of Current or Ex-Partner: No    Emotionally Abused: No    Physically Abused: No    Sexually Abused: No    FAMILY HISTORY: Family History  Problem Relation Age of Onset   Diabetes Mother    Liver cancer Mother    Breast cancer Sister 28   Prostate cancer Brother 89       metastatic   Heart attack Neg Hx    Stroke Neg Hx     Review of Systems  Constitutional:  Negative for appetite change, chills, fatigue, fever and unexpected weight change.  HENT:   Negative for hearing loss, lump/mass and trouble swallowing.  Eyes:  Negative for eye problems and icterus.  Respiratory:  Negative for chest tightness, cough and shortness of breath.   Cardiovascular:  Negative for chest pain, leg swelling and palpitations.  Gastrointestinal:  Negative for abdominal distention, abdominal pain, constipation, diarrhea, nausea and vomiting.  Endocrine: Negative for hot flashes.  Genitourinary:  Negative for difficulty urinating.   Musculoskeletal:  Negative for arthralgias.  Skin:  Negative for itching and rash.  Neurological:  Negative for dizziness, extremity weakness, headaches and numbness.  Hematological:  Negative for adenopathy. Does not bruise/bleed easily.  Psychiatric/Behavioral:  Negative for depression. The patient is not nervous/anxious.       PHYSICAL EXAMINATION    Vitals:   04/11/23 0954  BP: (!) 150/63  Pulse: 63  Resp: 18  Temp: 98.6 F (37 C)  SpO2: 95%    Physical Exam Constitutional:      General: She is not in acute distress.    Appearance: Normal appearance. She is not toxic-appearing.  HENT:     Head: Normocephalic and atraumatic.     Mouth/Throat:     Mouth: Mucous membranes are moist.     Pharynx: Oropharynx is clear. No oropharyngeal exudate or posterior oropharyngeal erythema.  Eyes:     General: No scleral icterus. Cardiovascular:     Rate and Rhythm: Normal rate and regular  rhythm.     Pulses: Normal pulses.     Heart sounds: Normal heart sounds.  Pulmonary:     Effort: Pulmonary effort is normal.     Breath sounds: Normal breath sounds.  Chest:     Comments: Right breast s/p lumpectomy, no sign of local recurrence, left breast benign Abdominal:     General: Abdomen is flat. Bowel sounds are normal. There is no distension.     Palpations: Abdomen is soft.     Tenderness: There is no abdominal tenderness.  Musculoskeletal:        General: No swelling.     Cervical back: Neck supple.  Lymphadenopathy:     Cervical: No cervical adenopathy.     Upper Body:     Right upper body: No supraclavicular or axillary adenopathy.     Left upper body: No supraclavicular or axillary adenopathy.  Skin:    General: Skin is warm and dry.     Findings: No rash.  Neurological:     General: No focal deficit present.     Mental Status: She is alert.  Psychiatric:        Mood and Affect: Mood normal.        Behavior: Behavior normal.        ASSESSMENT and THERAPY PLAN:   No problem-specific Assessment & Plan notes found for this encounter.    All questions were answered. The patient knows to call the clinic with any problems, questions or concerns. We can certainly see the patient much sooner if necessary.  Total encounter time:*** minutes*in face-to-face visit time, chart review, lab review, care coordination, order entry, and documentation of the encounter time.    Lillard Anes, NP 04/11/23 10:23 AM Medical Oncology and Hematology [facility] [address] [place], [place] [address] Tel. [phone]    Fax. [phone]  *Total Encounter Time as defined by the Centers for Medicare and Medicaid Services includes, in addition to the face-to-face time of a patient visit (documented in the note above) non-face-to-face time: obtaining and reviewing outside history, ordering and reviewing medications, tests or procedures, care coordination  (communications with other  health care professionals or caregivers) and documentation in the medical record.

## 2023-04-13 NOTE — Assessment & Plan Note (Signed)
 Breast cancer, status post lumpectomy and antiestrogen therapy Right-sided breast cancer treated with lumpectomy and anastrozole, completed April 2022. No recurrence noted. - Perform mammogram on April 26, 2023. - Reviewed healthy diet and exercise recommendations.   Stage 1A lung cancer, status post radiation therapy Stage 1A lung cancer treated with radiation. Awaiting CT scan results to confirm status. - Review CT scan results once available and communicate findings to her.  Hypertension Blood pressure slightly elevated. Adhering to antihypertensive regimen with one missed dose. - Continue current antihypertensive medication regimen. -F/u with PCP about HTN management as scheduled  Weight management Weight increased from 129 lbs to 131 lbs. Good appetite and regular eating habits. Desires further weight gain. - Continue current dietary habits including Ensure and peanut butter for protein. - Encourage regular exercise with stationary bike to maintain muscle strength. - Monitor weight and discuss with primary care provider if weight loss continues.

## 2023-04-15 NOTE — Progress Notes (Signed)
 Radiation Oncology         [phone] ________________________________  Name: Martha Edwards MRN: [id number]  Date: 04/19/2023  DOB: 04/07/1932  Follow-Up Visit Note  Outpatient  CC: Martha Palmer, MD  Martha Igo, DO  Diagnosis and Prior Radiotherapy:    ICD-10-CM   1. Malignant neoplasm of lower lobe of right lung [facility])  C34.31 CT CHEST WO CONTRAST       Cancer Staging  Carcinoma of upper-inner quadrant of right female breast [facility]) Staging form: Breast, AJCC 7th Edition - Pathologic stage from 04/07/2015: Stage Unknown (T1c, NX, cM0) - Unsigned Laterality: Right - Clinical: Stage IA (T1c, N0, M0) - Signed by Lowella Dell, MD on 05/16/2015  Malignant neoplasm of lower lobe of right lung [facility]) Staging form: Lung, AJCC 8th Edition - Clinical stage from 07/02/2022: Stage IA2 (cT1b, cN0, cM0) - Signed by Lonie Peak, MD on 07/03/2022 Stage prefix: Initial diagnosis  ==========DELIVERED PLANS==========  First Treatment Date: 2022-08-11 - Last Treatment Date: 2022-08-18   Plan Name: Lung_RLL_SBRT Site: Lung, Right Technique: SBRT/SRT-IMRT Mode: Photon Dose Per Fraction: 18 Gy Prescribed Dose (Delivered / Prescribed): 54 Gy / 54 Gy Prescribed Fxs (Delivered / Prescribed): 3 / 3  CHIEF COMPLAINT: Here for follow-up and surveillance of lung cancer  Narrative:  The patient returns today to review most recent CT scan of her chest.   Patient saw Martha Anes, NP on 04/11/2023 in survivorship clinic. She was doing well overall at that time.  CT of the chest on 04/11/2023 demonstrates stable appearance of the nodule within the anterior right lower lobe with surrounding posttreatment changes; a part solid nodule within the lateral left upper lobe, stable from previous examination; a nonsolid nodule within the superior segment of the left lower lobe measuring 1 cm, previously 0.7 cm; coronary artery calcifications; aortic atherosclerosis; and emphysema.  PAIN: Patient  denies any pain, shortness of breath of cough.    RESPIRATORY: None. Pt is on room air. She denies any shortness of breath, cough, or any other changes to her breathing since we last saw her.    SWALLOWING/DIET: The patient eats a regular, healthy diet.   ALLERGIES:  has no known allergies.  Meds: Current Outpatient Medications  Medication Sig Dispense Refill   acetaminophen (TYLENOL) 325 MG tablet Take 650 mg by mouth every 6 (six) hours as needed for mild pain.     amLODipine (NORVASC) 5 MG tablet Take 1 tablet (5 mg total) by mouth daily. 90 tablet 3   atorvastatin (LIPITOR) 40 MG tablet Take 40 mg by mouth daily.     CALCIUM PO Take 1 tablet by mouth 2 (two) times daily.     Cholecalciferol (VITAMIN D3) 25 MCG (1000 UT) CAPS Take 1,000 Units by mouth daily.     mirtazapine (REMERON) 30 MG tablet Take 60 mg by mouth at bedtime.     Multiple Vitamins-Minerals (CENTRUM SILVER) tablet Take 1 tablet by mouth daily.     warfarin (COUMADIN) 4 MG tablet TAKE AS DIRECTED BY COUMADIN CLINIC-90 DAY SUPPLY 150 tablet 1   No current facility-administered medications for this encounter.    Physical Findings:  The patient is in no acute distress. Patient is alert and oriented.  height is 5\' 8"  (1.727 m) and weight is 129 lb (58.5 kg). Her temperature is 97.6 F (36.4 C). Her blood pressure is 129/85 and her pulse is 78. Her respiration is 20 and oxygen saturation is 98%. .    Heart: RRR  Lungs CTAB Lymphatics: No palpable cervical, supraclavicular, or axillary adenopathy MSK: Ambulatory Neuro non focal, fluent speech Psych: pleasant affect, alert, conversant  Lab Findings: Lab Results  Component Value Date   WBC 8.0 10/25/2022   HGB 13.2 10/25/2022   HCT 40.4 10/25/2022   MCV 89.8 10/25/2022   PLT 218 10/25/2022    Radiographic Findings: CT Chest Wo Contrast Result Date: 04/11/2023 CLINICAL DATA:  Restaging non-small cell lung cancer. * Tracking Code: BO * EXAM: CT CHEST WITHOUT  CONTRAST TECHNIQUE: Multidetector CT imaging of the chest was performed following the standard protocol without IV contrast. RADIATION DOSE REDUCTION: This exam was performed according to the departmental dose-optimization program which includes automated exposure control, adjustment of the mA and/or kV according to patient size and/or use of iterative reconstruction technique. COMPARISON:  PET-CT from 11/23/2022 FINDINGS: Cardiovascular: Stable cardiac enlargement. Aortic atherosclerosis and coronary artery calcifications. No significant pericardial effusion. Mediastinum/Nodes: Thyroid gland, trachea, and esophagus appear normal. No enlarged mediastinal, axillary or supraclavicular lymph nodes. Lungs/Pleura: No pleural effusion. Moderate to severe changes of emphysema. Anterior basal right lower lobe lung nodule is stable in the interval with nodular consolidation, ground-glass attenuation, pleural thickening and architectural distortion. Underlying nodule measures approximately 0.9 x 0.9 cm and is unchanged from the prior exam. Scattered sub solid lung nodules are again noted, including: -Non solid nodule within the superior segment of the right lower lobe measures 1.2 cm, image 67/7. Stable from previous exam. -Part solid nodule within the lateral left upper lobe measures 1.1 cm with solid component 5 mm, image 55/7. Previously this measured 1.1 cm with solid component measuring 4 mm. -Non solid nodule within the superior segment of left lower lobe measures 1 cm, image 67/7. Previously 0.7 cm. Upper Abdomen: No acute abnormality.  Upper pole left kidney stone. Musculoskeletal: No chest wall mass or suspicious bone lesions identified. IMPRESSION: 1. Stable appearance of the nodule within the anterior right lower lobe with surrounding post treatment changes. 2. Part solid nodule within the lateral left upper lobe measures 1.1 cm with solid component 5 mm. Previously this measured 1.1 cm with solid component  measuring 4 mm. 3. Non-solid nodule within the superior segment of left lower lobe measures 1 cm. Previously 0.7 cm. 4. No new pulmonary nodules identified. 5. Coronary artery calcifications. 6. Aortic Atherosclerosis (ICD10-I70.0) and Emphysema (ICD10-J43.9). Electronically Signed   By: Signa Kell M.D.   On: 04/11/2023 10:52    Impression/Plan:  Stage IA2 ( cT1b, cN0, M0) NSCLC, adenocarcinoma, of the right lower lobe; s/p SBRT completed on 08/18/2022.   Dr. Basilio Cairo personally reviewed her imaging and I discussed her CT results with her today. Imaging demonstrates the previously treated RLL to be stable in appearance and minimal enlargement of the previously demonstrated non-solid pulmonary. No obvious evidence of progressive disease visualized today. Dr. Basilio Cairo recommends follow-up CT of the chest in 6 months.   We again discussed ways to boost caloric intake - she will continue to push Ensure plus or Boost with extra calories.   On date of service, in total, I spent 20 minutes on this encounter. Patient was seen in person. Note signed after encounter date; minutes pertain to date of service, only.  _____________________________________    Bryan Lemma, PA-C

## 2023-04-19 ENCOUNTER — Ambulatory Visit
Admission: RE | Admit: 2023-04-19 | Discharge: 2023-04-19 | Disposition: A | Discharge status: Home / Self Care | Payer: Self-pay | Source: Ambulatory Visit | Attending: Radiation Oncology | Admitting: Radiation Oncology

## 2023-04-19 VITALS — BP 129/85 | HR 78 | Temp 97.6°F | Resp 20 | Ht 68.0 in | Wt 129.0 lb

## 2023-04-19 DIAGNOSIS — C50211 Malignant neoplasm of upper-inner quadrant of right female breast: Secondary | ICD-10-CM | POA: Diagnosis not present

## 2023-04-19 DIAGNOSIS — N200 Calculus of kidney: Secondary | ICD-10-CM | POA: Diagnosis not present

## 2023-04-19 DIAGNOSIS — I2510 Atherosclerotic heart disease of native coronary artery without angina pectoris: Secondary | ICD-10-CM | POA: Diagnosis not present

## 2023-04-19 DIAGNOSIS — I700 Atherosclerosis of aorta: Secondary | ICD-10-CM | POA: Insufficient documentation

## 2023-04-19 DIAGNOSIS — J439 Emphysema, unspecified: Secondary | ICD-10-CM | POA: Diagnosis not present

## 2023-04-19 DIAGNOSIS — C3431 Malignant neoplasm of lower lobe, right bronchus or lung: Secondary | ICD-10-CM | POA: Diagnosis not present

## 2023-04-19 DIAGNOSIS — F17210 Nicotine dependence, cigarettes, uncomplicated: Secondary | ICD-10-CM | POA: Diagnosis not present

## 2023-04-19 DIAGNOSIS — Z79899 Other long term (current) drug therapy: Secondary | ICD-10-CM | POA: Insufficient documentation

## 2023-04-19 DIAGNOSIS — Z170 Estrogen receptor positive status [ER+]: Secondary | ICD-10-CM | POA: Insufficient documentation

## 2023-04-26 ENCOUNTER — Ambulatory Visit
Admission: RE | Admit: 2023-04-26 | Discharge: 2023-04-26 | Disposition: A | Discharge status: Home / Self Care | Payer: Medicare Other | Source: Ambulatory Visit | Attending: Family Medicine | Admitting: Family Medicine

## 2023-04-26 DIAGNOSIS — R921 Mammographic calcification found on diagnostic imaging of breast: Secondary | ICD-10-CM | POA: Diagnosis not present

## 2023-05-19 ENCOUNTER — Ambulatory Visit: Payer: Medicare Other | Attending: Cardiology

## 2023-05-19 DIAGNOSIS — Z5181 Encounter for therapeutic drug level monitoring: Secondary | ICD-10-CM | POA: Diagnosis not present

## 2023-05-19 LAB — POCT INR: INR: 2.3 (ref 2.0–3.0)

## 2023-05-19 NOTE — Patient Instructions (Signed)
 Description   Continue taking warfarin 1.5 tablets of warfarin daily except for 2 tablets on Sundays.  Recheck INR in 7 weeks.  [facility] [phone]

## 2023-05-31 ENCOUNTER — Telehealth: Payer: Self-pay | Admitting: *Deleted

## 2023-05-31 NOTE — Telephone Encounter (Signed)
 CALLED PATIENT TO INFORM OF CT FOR 10-20-23- ARRIVAL TIME- 1:15 PM @ [facility] NO RESTRICTIONS TO SCAN, PATIENT TO RECEIVE RESULTS FROM ELLIE MUIR ON 10-25-23 @ 11 AM, SPOKE WITH PATIENT'S SON DARRYL DAY AND HE IS AWARE OF THESE APPTS. AND THE INSTRUCTIONS

## 2023-06-14 DIAGNOSIS — C50919 Malignant neoplasm of unspecified site of unspecified female breast: Secondary | ICD-10-CM | POA: Diagnosis not present

## 2023-06-14 DIAGNOSIS — D6869 Other thrombophilia: Secondary | ICD-10-CM | POA: Diagnosis not present

## 2023-06-14 DIAGNOSIS — G479 Sleep disorder, unspecified: Secondary | ICD-10-CM | POA: Diagnosis not present

## 2023-06-14 DIAGNOSIS — I272 Pulmonary hypertension, unspecified: Secondary | ICD-10-CM | POA: Diagnosis not present

## 2023-06-14 DIAGNOSIS — I4891 Unspecified atrial fibrillation: Secondary | ICD-10-CM | POA: Diagnosis not present

## 2023-06-14 DIAGNOSIS — C3491 Malignant neoplasm of unspecified part of right bronchus or lung: Secondary | ICD-10-CM | POA: Diagnosis not present

## 2023-06-14 DIAGNOSIS — E46 Unspecified protein-calorie malnutrition: Secondary | ICD-10-CM | POA: Diagnosis not present

## 2023-06-29 ENCOUNTER — Ambulatory Visit (HOSPITAL_COMMUNITY)
Admission: RE | Admit: 2023-06-29 | Discharge: 2023-06-29 | Disposition: A | Discharge status: Home / Self Care | Payer: Medicare Other | Source: Ambulatory Visit | Attending: Cardiovascular Disease | Admitting: Cardiovascular Disease

## 2023-06-29 DIAGNOSIS — I350 Nonrheumatic aortic (valve) stenosis: Secondary | ICD-10-CM | POA: Diagnosis not present

## 2023-06-29 LAB — ECHOCARDIOGRAM COMPLETE
AR max vel: 0.72 cm2
AV Area VTI: 0.66 cm2
AV Area mean vel: 0.64 cm2
AV Mean grad: 33 mmHg
AV Peak grad: 51.5 mmHg
Ao pk vel: 3.59 m/s
Area-P 1/2: 2.33 cm2
MV M vel: 6.16 m/s
MV Peak grad: 151.8 mmHg
P 1/2 time: 634 ms
Radius: 0.6 cm
S' Lateral: 2.4 cm

## 2023-06-30 ENCOUNTER — Ambulatory Visit: Payer: Self-pay | Admitting: Cardiovascular Disease

## 2023-07-07 ENCOUNTER — Ambulatory Visit: Attending: Cardiology

## 2023-07-07 DIAGNOSIS — Z5181 Encounter for therapeutic drug level monitoring: Secondary | ICD-10-CM | POA: Insufficient documentation

## 2023-07-07 LAB — POCT INR: INR: 3.9 — AB (ref 2.0–3.0)

## 2023-07-07 NOTE — Patient Instructions (Signed)
 Description   HOLD today's dose and then Continue taking warfarin 1.5 tablets of warfarin daily except for 2 tablets on Sundays.  Recheck INR in 5 weeks.  Coumadin  Clinic [phone]

## 2023-07-11 ENCOUNTER — Encounter: Payer: Self-pay | Admitting: Cardiovascular Disease

## 2023-07-11 ENCOUNTER — Ambulatory Visit: Payer: Medicare Other | Attending: Cardiovascular Disease | Admitting: Cardiovascular Disease

## 2023-07-11 VITALS — BP 120/70 | HR 52 | Ht 68.0 in | Wt 122.6 lb

## 2023-07-11 DIAGNOSIS — I10 Essential (primary) hypertension: Secondary | ICD-10-CM | POA: Insufficient documentation

## 2023-07-11 DIAGNOSIS — I350 Nonrheumatic aortic (valve) stenosis: Secondary | ICD-10-CM | POA: Insufficient documentation

## 2023-07-11 MED ORDER — AMLODIPINE BESYLATE 5 MG PO TABS: 5.0000 mg | ORAL_TABLET | Freq: Every day | ORAL | 3 refills | Status: AC

## 2023-07-11 NOTE — Progress Notes (Signed)
 Cardiology Office Note:    Date:  07/11/2023   ID:  Martha Edwards, DOB [birth year], MRN [id number]  PCP:  Martha Bertin, MD   [facility] Providers Cardiologist:  None     Referring MD: Martha Bertin, MD   Chief Complaint  Patient presents with   Follow-up    Aortic Stenosis    History of Present Illness:    Martha Edwards is a 89 y.o. female presenting for follow-up of aortic stenosis.  I saw her last in January 2025 where we decided to follow her clinically for severe paradoxical low-flow low gradient aortic stenosis.  This decision was made in the context of her advanced age and presence of lung cancer.  She presents today for follow-up evaluation.  She was last seen in the oncology clinic in March of this year and was felt to be stable with respect to her non-small cell lung cancer after completing radiation therapy with no obvious evidence of progressive disease on her CT scan of the chest.  There is a follow-up CT scan planned in 6 months.  The patient had a recent echocardiogram showing normal LVEF of 60 to 65%, grade 1 diastolic dysfunction, normal RV function, mild MR, and severe paradoxical low-flow low gradient aortic stenosis with a mean gradient of 33 mmHg and calculated aortic valve area of 0.66 cm.  Stroke-volume index was actually normal at 37 and dimensionless index is 0.26.  The patient is here with her daughter today.  She is getting along pretty well.  She denies chest pain, chest pressure, shortness of breath, orthopnea, or PND.  No heart palpitations, lightheadedness, or syncope.  She still drives.  She still gets out and run some errands.  Current Medications: Current Meds  Medication Sig   acetaminophen  (TYLENOL ) 325 MG tablet Take 650 mg by mouth every 6 (six) hours as needed for mild pain.   amLODipine  (NORVASC ) 5 MG tablet Take 1 tablet (5 mg total) by mouth daily.   atorvastatin (LIPITOR) 40 MG tablet Take 40 mg by mouth daily.   CALCIUM PO Take  1 tablet by mouth 2 (two) times daily.   Cholecalciferol (VITAMIN D3) 25 MCG (1000 UT) CAPS Take 1,000 Units by mouth daily.   megestrol (MEGACE) 20 MG tablet Take by mouth daily.   Multiple Vitamins-Minerals (CENTRUM SILVER) tablet Take 1 tablet by mouth daily.   traZODone (DESYREL) 150 MG tablet 1 tablet at bedtime Orally Once a day for 30 days   warfarin (COUMADIN ) 4 MG tablet TAKE AS DIRECTED BY COUMADIN  CLINIC-90 DAY SUPPLY   [DISCONTINUED] mirtazapine (REMERON) 30 MG tablet Take 60 mg by mouth at bedtime.     Allergies:   Patient has no known allergies.   ROS:   Please see the history of present illness.    All other systems reviewed and are negative.  EKGs/Labs/Other Studies Reviewed:    The following studies were reviewed today: Cardiac Studies & Procedures   ______________________________________________________________________________________________     ECHOCARDIOGRAM  ECHOCARDIOGRAM COMPLETE 06/29/2023  Narrative ECHOCARDIOGRAM REPORT    Patient Name:   Martha Edwards Date of Exam: 06/29/2023 Medical Rec #:  [id number]      Height:       68.0 in Accession #:    [id number]     Weight:       129.0 lb Date of Birth:  1932/12/16      BSA:          1.696 m Patient Age:  89 years       BP:           150/63 mmHg Patient Gender: F              HR:           55 bpm. Exam Location:  [address]  Procedure: 2D Echo, 3D Echo, Cardiac Doppler and Color Doppler (Both Spectral and Color Flow Doppler were utilized during procedure).  Indications:    I35.0 Aortic Stenosis  History:        Patient has prior history of Echocardiogram examinations, most recent 01/11/2023. Aortic Valve Disease, Arrythmias:Atrial Flutter, Signs/Symptoms:Dyspnea; Risk Factors:Former Smoker, Hypertension and Dyslipidemia. Right Non-Small Cell Lung Cancer with Radiation (2024), Right Breast Cancer status post Lumpectomy (2017), Chronic Kidney Disease, Aortic Stenosis.  Sonographer:    Ewing Holiday RDCS Referring Phys: Martha Edwards  IMPRESSIONS   1. Left ventricular ejection fraction, by estimation, is 60 to 65%. The left ventricle has normal function. The left ventricle has no regional wall motion abnormalities. Left ventricular diastolic parameters are consistent with Grade I diastolic dysfunction (impaired relaxation). 2. Right ventricular systolic function is normal. The right ventricular size is normal. There is moderately elevated pulmonary artery systolic pressure. The estimated right ventricular systolic pressure is 45.5 mmHg. 3. Left atrial size was severely dilated. 4. Right atrial size was moderately dilated. 5. The mitral valve is normal in structure. Mild mitral valve regurgitation. No evidence of mitral stenosis. 6. Tricuspid valve regurgitation is mild to moderate. 7. The aortic valve is tricuspid. There is severe calcifcation of the aortic valve. Aortic valve regurgitation is mild. Severe paradoxical low flow/low gradient aortic valve stenosis. Aortic valve mean gradient measures 33.0 mmHg. AVA 0.66 cm^2. 8. The pulmonic valve was abnormal. Pulmonic valve regurgitation is moderate. 9. The inferior vena cava is normal in size with greater than 50% respiratory variability, suggesting right atrial pressure of 3 mmHg.  FINDINGS Left Ventricle: Left ventricular ejection fraction, by estimation, is 60 to 65%. The left ventricle has normal function. The left ventricle has no regional wall motion abnormalities. The left ventricular internal cavity size was normal in size. There is no left ventricular hypertrophy. Left ventricular diastolic parameters are consistent with Grade I diastolic dysfunction (impaired relaxation).  Right Ventricle: The right ventricular size is normal. No increase in right ventricular wall thickness. Right ventricular systolic function is normal. There is moderately elevated pulmonary artery systolic pressure. The tricuspid regurgitant velocity  is 3.26 m/s, and with an assumed right atrial pressure of 3 mmHg, the estimated right ventricular systolic pressure is 45.5 mmHg.  Left Atrium: Left atrial size was severely dilated.  Right Atrium: Right atrial size was moderately dilated.  Pericardium: Trivial pericardial effusion is present.  Mitral Valve: The mitral valve is normal in structure. Mild mitral valve regurgitation. No evidence of mitral valve stenosis.  Tricuspid Valve: The tricuspid valve is normal in structure. Tricuspid valve regurgitation is mild to moderate.  Aortic Valve: The aortic valve is tricuspid. There is severe calcifcation of the aortic valve. Aortic valve regurgitation is mild. Aortic regurgitation PHT measures 634 msec. Severe aortic stenosis is present. Aortic valve mean gradient measures 33.0 mmHg. Aortic valve peak gradient measures 51.5 mmHg. Aortic valve area, by VTI measures 0.66 cm.  Pulmonic Valve: The pulmonic valve was abnormal. Pulmonic valve regurgitation is moderate.  Aorta: The aortic root is normal in size and structure.  Venous: The inferior vena cava is normal in size with greater than 50% respiratory  variability, suggesting right atrial pressure of 3 mmHg.  IAS/Shunts: No atrial level shunt detected by color flow Doppler.  Additional Comments: 3D was performed not requiring image post processing on an independent workstation and was normal.   LEFT VENTRICLE PLAX 2D LVIDd:         4.00 cm   Diastology LVIDs:         2.40 cm   LV e' medial:    5.84 cm/s LV PW:         0.80 cm   LV E/e' medial:  15.4 LV IVS:        0.80 cm   LV e' lateral:   12.50 cm/s LVOT diam:     1.80 cm   LV E/e' lateral: 7.2 LV SV:         62 LV SV Index:   37 LVOT Area:     2.54 cm  3D Volume EF: 3D EF:        81 % LV EDV:       111 ml LV ESV:       21 ml LV SV:        90 ml  RIGHT VENTRICLE RV Basal diam:  3.40 cm RV S prime:     21.40 cm/s TAPSE (M-mode): 2.9 cm RVSP:           50.5 mmHg  LEFT  ATRIUM             Index        RIGHT ATRIUM           Index LA diam:        3.60 cm 2.12 cm/m   RA Pressure: 8.00 mmHg LA Vol (A2C):   91.5 ml 53.94 ml/m  RA Area:     24.80 cm LA Vol (A4C):   99.6 ml 58.72 ml/m  RA Volume:   81.00 ml  47.75 ml/m LA Biplane Vol: 99.7 ml 58.78 ml/m AORTIC VALVE                     PULMONIC VALVE AV Area (Vmax):    0.72 cm      RVOT Peak grad: 3 mmHg AV Area (Vmean):   0.64 cm AV Area (VTI):     0.66 cm AV Vmax:           358.80 cm/s AV Vmean:          249.800 cm/s AV VTI:            0.942 m AV Peak Grad:      51.5 mmHg AV Mean Grad:      33.0 mmHg LVOT Vmax:         101.50 cm/s LVOT Vmean:        63.225 cm/s LVOT VTI:          0.246 m LVOT/AV VTI ratio: 0.26 AI PHT:            634 msec  AORTA Ao Root diam: 2.60 cm Ao Asc diam:  2.30 cm  MITRAL VALVE                 TRICUSPID VALVE MV Area (PHT):               TR Peak grad:   42.5 mmHg MV Decel Time: 326 msec      TR Vmax:        326.00 cm/s MR Peak grad:   151.8 mmHg  Estimated RAP:  8.00 mmHg MR Mean grad:   110.0 mmHg   RVSP:           50.5 mmHg MR Vmax:        616.00 cm/s MR Vmean:       512.0 cm/s   SHUNTS MR PISA:        2.26 cm     Systemic VTI:  0.25 m MR PISA Radius: 0.60 cm      Systemic Diam: 1.80 cm MV E velocity: 90.30 cm/s    Pulmonic VTI:  0.173 m MV A velocity: 125.00 cm/s MV E/A ratio:  0.72  Dalton McleanMD Electronically signed by Archer Bear Signature Date/Time: 06/29/2023/2:15:22 PM    Final          ______________________________________________________________________________________________      EKG:        Recent Labs: 10/25/2022: ALT 10; BUN 14; Creatinine 1.22; Hemoglobin 13.2; Platelet Count 218; Potassium 4.6; Sodium 144  Recent Lipid Panel No results found for: "CHOL", "TRIG", "HDL", "CHOLHDL", "VLDL", "LDLCALC", "LDLDIRECT"       Physical Exam:    VS:  Pulse (!) 52   Ht 5\' 8"  (1.727 m)   Wt 122 lb 9.6 oz (55.6 kg)   BMI  18.64 kg/m     Wt Readings from Last 3 Encounters:  07/11/23 122 lb 9.6 oz (55.6 kg)  04/19/23 129 lb (58.5 kg)  04/11/23 131 lb 11.2 oz (59.7 kg)     GEN: Elderly frail-appearing woman in no acute distress HEENT: Normal NECK: No JVD; No carotid bruits LYMPHATICS: No lymphadenopathy CARDIAC: RRR, 2/6 harsh crescendo decrescendo murmur in the right upper sternal border, mid peaking with A2 audible RESPIRATORY:  Clear to auscultation without rales, wheezing or rhonchi  ABDOMEN: Soft, non-tender, non-distended MUSCULOSKELETAL:  No edema; No deformity  SKIN: Warm and dry NEUROLOGIC:  Alert and oriented x 3 PSYCHIATRIC:  Normal affect   Assessment & Plan Nonrheumatic aortic (valve) stenosis The patient has severe stage D3 paradoxical low-flow low gradient aortic stenosis.  Echo findings again are reviewed as above with LVEF 60 to 65%, grade 1 diastolic dysfunction, and severe calcification and restriction of the aortic valve leaflets with a mean gradient of 33 mmHg, stroke-volume index of 37, dimensionless index of 0.26, and calculated aortic valve area of 0.66 cm.  We discussed the natural history of aortic stenosis at length today.  Taking all things into account with her exam, lack of clinical symptoms, and echo findings, I think she has severe, asymptomatic stage D3 aortic stenosis.  We had a lengthy discussion today about the natural history of aortic stenosis and treatment strategies.  Considering her advanced age, lack of symptoms, and comorbid conditions outlined above, we decided on continued close surveillance through shared decision making discussion today.  I will see her back in 6 months with an echocardiogram prior to the visit.  She may be inclined to proceed with TAVR at that time.  I will also give her another follow-up visit with oncology and a surveillance CT scan.  All of this will be reviewed when she returns for follow-up. Essential hypertension Blood pressure  well-controlled on current medications.            Medication Adjustments/Labs and Tests Ordered: Current medicines are reviewed at length with the patient today.  Concerns regarding medicines are outlined above.  Orders Placed This Encounter  Procedures   EKG 12-Lead   No orders of the defined types were placed in this  encounter.   There are no Patient Instructions on file for this visit.   Signed, Arnoldo Lapping, MD  07/11/2023 11:58 AM    [facility]

## 2023-07-11 NOTE — Assessment & Plan Note (Signed)
Blood pressure well controlled on current medications.

## 2023-07-11 NOTE — Patient Instructions (Signed)
 Testing/Procedures: ECHO (in 6 months, prior to visit) Your physician has requested that you have an echocardiogram. Echocardiography is a painless test that uses sound waves to create images of your heart. It provides your doctor with information about the size and shape of your heart and how well your heart's chambers and valves are working. This procedure takes approximately one hour. There are no restrictions for this procedure. Please do NOT wear cologne, perfume, aftershave, or lotions (deodorant is allowed). Please arrive 15 minutes prior to your appointment time.  Please note: We ask at that you not bring children with you during ultrasound (echo/ vascular) testing. Due to room size and safety concerns, children are not allowed in the ultrasound rooms during exams. Our front office staff cannot provide observation of children in our lobby area while testing is being conducted. An adult accompanying a patient to their appointment will only be allowed in the ultrasound room at the discretion of the ultrasound technician under special circumstances. We apologize for any inconvenience.  Follow-Up: At [facility], you and your health needs are our priority.  As part of our continuing mission to provide you with exceptional heart care, our providers are all part of one team.  This team includes your primary Cardiologist (physician) and Advanced Practice Providers or APPs (Physician Assistants and Nurse Practitioners) who all work together to provide you with the care you need, when you need it.  Your next appointment:   6 month(s)  Provider:   Arnoldo Lapping, MD

## 2023-08-11 ENCOUNTER — Ambulatory Visit: Attending: Cardiology

## 2023-08-11 DIAGNOSIS — Z5181 Encounter for therapeutic drug level monitoring: Secondary | ICD-10-CM

## 2023-08-11 LAB — POCT INR: INR: 1.8 — AB (ref 2.0–3.0)

## 2023-08-11 NOTE — Patient Instructions (Signed)
 Description   Take 2 tablets today and then Continue taking warfarin 1.5 tablets of warfarin daily except for 2 tablets on Sundays.  Recheck INR in 4 weeks.  Coumadin  Clinic [phone]

## 2023-08-11 NOTE — Progress Notes (Signed)
Please see anticoagulation encounter.

## 2023-08-16 DIAGNOSIS — G479 Sleep disorder, unspecified: Secondary | ICD-10-CM | POA: Diagnosis not present

## 2023-08-16 DIAGNOSIS — R634 Abnormal weight loss: Secondary | ICD-10-CM | POA: Diagnosis not present

## 2023-08-16 DIAGNOSIS — F331 Major depressive disorder, recurrent, moderate: Secondary | ICD-10-CM | POA: Diagnosis not present

## 2023-09-08 ENCOUNTER — Ambulatory Visit: Attending: Cardiology | Admitting: *Deleted

## 2023-09-08 DIAGNOSIS — I4892 Unspecified atrial flutter: Secondary | ICD-10-CM | POA: Diagnosis not present

## 2023-09-08 DIAGNOSIS — Z5181 Encounter for therapeutic drug level monitoring: Secondary | ICD-10-CM | POA: Diagnosis not present

## 2023-09-08 LAB — POCT INR: POC INR: 1.7

## 2023-09-08 NOTE — Progress Notes (Signed)
 INR 1.7  Please see anticoagulation encounter

## 2023-09-08 NOTE — Patient Instructions (Signed)
 Description   Take 2.5 tablets of warfarin today Then START taking warfarin 1.5 tablets daily except for 2 tablets on Sunday and Thursday. Recheck INR in 2 weeks.  Coumadin  Clinic [phone]

## 2023-09-22 ENCOUNTER — Ambulatory Visit: Attending: Cardiology

## 2023-09-22 DIAGNOSIS — Z5181 Encounter for therapeutic drug level monitoring: Secondary | ICD-10-CM | POA: Diagnosis not present

## 2023-09-22 LAB — POCT INR: INR: 2.2 (ref 2.0–3.0)

## 2023-09-22 NOTE — Progress Notes (Signed)
 INR 2.2; Please see anticoagulation encounter

## 2023-09-22 NOTE — Patient Instructions (Signed)
 Description   Continue taking warfarin 1.5 tablets daily except for 2 tablets on Sunday and Thursday. Recheck INR in 4 weeks.  Coumadin  Clinic [phone]

## 2023-10-12 ENCOUNTER — Other Ambulatory Visit: Payer: Self-pay | Admitting: *Deleted

## 2023-10-12 ENCOUNTER — Encounter: Payer: Self-pay | Admitting: Adult Health

## 2023-10-12 ENCOUNTER — Other Ambulatory Visit: Payer: Self-pay | Admitting: Adult Health

## 2023-10-12 DIAGNOSIS — C50211 Malignant neoplasm of upper-inner quadrant of right female breast: Secondary | ICD-10-CM

## 2023-10-12 DIAGNOSIS — Z170 Estrogen receptor positive status [ER+]: Secondary | ICD-10-CM

## 2023-10-14 ENCOUNTER — Inpatient Hospital Stay (HOSPITAL_BASED_OUTPATIENT_CLINIC_OR_DEPARTMENT_OTHER): Payer: Medicare Other | Admitting: Adult Health

## 2023-10-14 ENCOUNTER — Inpatient Hospital Stay: Payer: Medicare Other

## 2023-10-14 ENCOUNTER — Other Ambulatory Visit: Payer: Self-pay | Admitting: Adult Health

## 2023-10-14 ENCOUNTER — Inpatient Hospital Stay: Payer: Medicare Other | Attending: Adult Health

## 2023-10-14 ENCOUNTER — Encounter: Payer: Self-pay | Admitting: Adult Health

## 2023-10-14 VITALS — BP 122/64 | HR 64 | Temp 97.6°F | Resp 16 | Ht 68.0 in | Wt 125.5 lb

## 2023-10-14 DIAGNOSIS — N1832 Chronic kidney disease, stage 3b: Secondary | ICD-10-CM | POA: Diagnosis not present

## 2023-10-14 DIAGNOSIS — Z87891 Personal history of nicotine dependence: Secondary | ICD-10-CM | POA: Insufficient documentation

## 2023-10-14 DIAGNOSIS — E7800 Pure hypercholesterolemia, unspecified: Secondary | ICD-10-CM | POA: Insufficient documentation

## 2023-10-14 DIAGNOSIS — C50211 Malignant neoplasm of upper-inner quadrant of right female breast: Secondary | ICD-10-CM

## 2023-10-14 DIAGNOSIS — R634 Abnormal weight loss: Secondary | ICD-10-CM | POA: Insufficient documentation

## 2023-10-14 DIAGNOSIS — C3431 Malignant neoplasm of lower lobe, right bronchus or lung: Secondary | ICD-10-CM

## 2023-10-14 DIAGNOSIS — M19049 Primary osteoarthritis, unspecified hand: Secondary | ICD-10-CM | POA: Diagnosis not present

## 2023-10-14 DIAGNOSIS — I340 Nonrheumatic mitral (valve) insufficiency: Secondary | ICD-10-CM | POA: Diagnosis not present

## 2023-10-14 DIAGNOSIS — M816 Localized osteoporosis [Lequesne]: Secondary | ICD-10-CM

## 2023-10-14 DIAGNOSIS — Z170 Estrogen receptor positive status [ER+]: Secondary | ICD-10-CM

## 2023-10-14 DIAGNOSIS — Z79899 Other long term (current) drug therapy: Secondary | ICD-10-CM | POA: Insufficient documentation

## 2023-10-14 DIAGNOSIS — Z8042 Family history of malignant neoplasm of prostate: Secondary | ICD-10-CM | POA: Diagnosis not present

## 2023-10-14 DIAGNOSIS — Z803 Family history of malignant neoplasm of breast: Secondary | ICD-10-CM | POA: Insufficient documentation

## 2023-10-14 DIAGNOSIS — Z923 Personal history of irradiation: Secondary | ICD-10-CM | POA: Insufficient documentation

## 2023-10-14 DIAGNOSIS — Z853 Personal history of malignant neoplasm of breast: Secondary | ICD-10-CM | POA: Insufficient documentation

## 2023-10-14 DIAGNOSIS — Z800 Family history of malignant neoplasm of digestive organs: Secondary | ICD-10-CM | POA: Insufficient documentation

## 2023-10-14 DIAGNOSIS — N3281 Overactive bladder: Secondary | ICD-10-CM | POA: Diagnosis not present

## 2023-10-14 DIAGNOSIS — I129 Hypertensive chronic kidney disease with stage 1 through stage 4 chronic kidney disease, or unspecified chronic kidney disease: Secondary | ICD-10-CM | POA: Insufficient documentation

## 2023-10-14 DIAGNOSIS — Z7901 Long term (current) use of anticoagulants: Secondary | ICD-10-CM | POA: Insufficient documentation

## 2023-10-14 DIAGNOSIS — M810 Age-related osteoporosis without current pathological fracture: Secondary | ICD-10-CM | POA: Insufficient documentation

## 2023-10-14 DIAGNOSIS — M8580 Other specified disorders of bone density and structure, unspecified site: Secondary | ICD-10-CM

## 2023-10-14 LAB — CBC WITH DIFFERENTIAL (CANCER CENTER ONLY)
Abs Immature Granulocytes: 0.01 K/uL (ref 0.00–0.07)
Basophils Absolute: 0 K/uL (ref 0.0–0.1)
Basophils Relative: 1 %
Eosinophils Absolute: 0.2 K/uL (ref 0.0–0.5)
Eosinophils Relative: 3 %
HCT: 40 % (ref 36.0–46.0)
Hemoglobin: 13.3 g/dL (ref 12.0–15.0)
Immature Granulocytes: 0 %
Lymphocytes Relative: 26 %
Lymphs Abs: 1.7 K/uL (ref 0.7–4.0)
MCH: 30.6 pg (ref 26.0–34.0)
MCHC: 33.3 g/dL (ref 30.0–36.0)
MCV: 92 fL (ref 80.0–100.0)
Monocytes Absolute: 0.5 K/uL (ref 0.1–1.0)
Monocytes Relative: 7 %
Neutro Abs: 4.1 K/uL (ref 1.7–7.7)
Neutrophils Relative %: 63 %
Platelet Count: 179 K/uL (ref 150–400)
RBC: 4.35 MIL/uL (ref 3.87–5.11)
RDW: 14 % (ref 11.5–15.5)
WBC Count: 6.5 K/uL (ref 4.0–10.5)
nRBC: 0 % (ref 0.0–0.2)

## 2023-10-14 LAB — CMP (CANCER CENTER ONLY)
ALT: 20 U/L (ref 0–44)
AST: 27 U/L (ref 15–41)
Albumin: 4.1 g/dL (ref 3.5–5.0)
Alkaline Phosphatase: 54 U/L (ref 38–126)
Anion gap: 7 (ref 5–15)
BUN: 14 mg/dL (ref 8–23)
CO2: 29 mmol/L (ref 22–32)
Calcium: 9.7 mg/dL (ref 8.9–10.3)
Chloride: 108 mmol/L (ref 98–111)
Creatinine: 1.25 mg/dL — ABNORMAL HIGH (ref 0.44–1.00)
GFR, Estimated: 41 mL/min — ABNORMAL LOW (ref >60)
Glucose, Bld: 98 mg/dL (ref 70–99)
Potassium: 3.7 mmol/L (ref 3.5–5.1)
Sodium: 144 mmol/L (ref 135–145)
Total Bilirubin: 0.6 mg/dL (ref 0.0–1.2)
Total Protein: 6.6 g/dL (ref 6.5–8.1)

## 2023-10-14 MED ORDER — ZOLEDRONIC ACID 4 MG/5ML IV CONC
3.0000 mg | Freq: Once | INTRAVENOUS | Status: AC
  Administered 2023-10-14: 3 mg via INTRAVENOUS
  Filled 2023-10-14: qty 3.75

## 2023-10-14 NOTE — Progress Notes (Signed)
 [facility] Cancer Follow up:    Verena Mems, MD [address] [place] [address]   DIAGNOSIS: Cancer Staging  Carcinoma of upper-inner quadrant of right female breast [facility]) Staging form: Breast, AJCC 7th Edition - Pathologic stage from 04/07/2015: Stage Unknown (T1c, NX, cM0) - Unsigned Laterality: Right - Clinical: Stage IA (T1c, N0, M0) - Signed by Layla Sandria BROCKS, MD on 05/16/2015  Malignant neoplasm of lower lobe of right lung [facility]) Staging form: Lung, AJCC 8th Edition - Clinical stage from 07/02/2022: Stage IA2 (cT1b, cN0, cM0) - Signed by Izell Domino, MD on 07/03/2022 Stage prefix: Initial diagnosis    SUMMARY OF ONCOLOGIC HISTORY: Oncology History  Carcinoma of upper-inner quadrant of right female breast (HCC)  01/20/2015 Mammogram   In the right breast, a possible mass warranting further evaluation. In the left breast, no findings suspicious for malignancy   02/19/2015 Breast US    Right breast: irregularly marginated mass with increased vascularity located at 12:30 o'clock position 4 cm from the nipple measuring 1.3 x 1.3 x 1.0 cm in size   02/27/2015 Initial Biopsy   Right breast core needle bx: Invasive ductal carcinoma, grade 1-2, ER+ (100%), PR+ (90%), HER2/neu negative (ratio 1.15), Ki6710%   02/27/2015 Clinical Stage   Stage IA: T1c No   04/07/2015 Definitive Surgery   Invasive ductal carcinoma, grade 1, ER+, PR+, HER2/neu negative, Ki67 10%   04/07/2015 Pathologic Stage   Stage IA: pT1c pNx    Radiation Therapy   pt declined   05/16/2015 - 05/2020 Anti-estrogen oral therapy   Anastrozole  1 mg daily.   05/21/2015 Survivorship   Survivorship care plan completed and mailed to patient in lieu of in person visit at her request   Malignant neoplasm of lower lobe of right lung (HCC)  07/02/2022 Cancer Staging   Staging form: Lung, AJCC 8th Edition - Clinical stage from 07/02/2022: Stage IA2 (cT1b, cN0, cM0) - Signed by Izell Domino, MD on 07/03/2022 Stage prefix: Initial diagnosis   07/03/2022 Initial Diagnosis   Malignant neoplasm of lower lobe of right lung (HCC)   08/11/2022 - 08/18/2022 Radiation Therapy   First Treatment Date: 2022-08-11 - Last Treatment Date: 2022-08-18   Plan Name: Lung_RLL_SBRT Site: Lung, Right Technique: SBRT/SRT-IMRT Mode: Photon Dose Per Fraction: 18 Gy Prescribed Dose (Delivered / Prescribed): 54 Gy / 54 Gy Prescribed Fxs (Delivered / Prescribed): 3 / 3       CURRENT THERAPY: observation  INTERVAL HISTORY: Discussed the use of AI scribe software for clinical note transcription with the patient, who gave verbal consent to proceed.  History of Present Illness Joya Day Bonnette is an 89 year old female with a history of breast and lung cancer who presents with concerns about weight loss.  She has lost weight from 131 pounds in March to 125 pounds currently, despite adequate food intake. Her diet includes breakfast with a donut and two cups of coffee. She is concerned about this unintentional weight loss.  Her medical history includes right breast invasive ductal carcinoma, ER, PR positive, diagnosed in 2017, treated with lumpectomy and antiestrogen therapy completed in 2022. A mammogram was performed in March 2025.  In 2022, she was diagnosed with stage 1A2 right lung adenocarcinoma and underwent radiation therapy. A CT chest in March 2025 identified a nodule in the right lower lobe, a part-solid nodule in the left upper lobe measuring 1.1 cm, and a non-solid nodule in the superior segment of the left lower lobe  measuring 1 cm, previously 0.7 cm. A repeat CT chest is scheduled for October 20, 2023.  Bone density testing in September 2024 demonstrated osteoporosis with a T-score of -2.7 in the right femur. She has been receiving Zometa  annually, renally dosed at 3 mg, since September 2024. Her GFR is 41.  She has no cough, shortness of breath, funny heartbeats, bowel or bladder  issues, and no dental or breast concerns at this time.    Patient Active Problem List   Diagnosis Date Noted   Osteoporosis 10/14/2022   Malignant neoplasm of lower lobe of right lung (HCC) 07/03/2022   Lung nodule 06/10/2022   Genetic testing 09/20/2017   Family history of breast cancer    Family history of prostate cancer    Encounter for therapeutic drug monitoring 05/02/2017   Osteopenia determined by x-ray 04/20/2016   Carcinoma of upper-inner quadrant of right female breast (HCC) 05/16/2015   Tachy-brady syndrome (HCC) 10/17/2014   Paroxysmal atrial flutter (HCC) 08/30/2013   Chronic anticoagulation 08/30/2013   Essential hypertension 08/30/2013    has no known allergies.  MEDICAL HISTORY: Past Medical History:  Diagnosis Date   Anticoagulated    Anxiety    Arthritis of hand    Atrial flutter (HCC)    with LVEF 60%, LVH, LAE and holter with rate control and Ave HR 60 bpm    Breast cancer (HCC) 2017    RIGHT BREAST CA   Carotid artery plaque    mild bilateral carotid plaque without significant stenosis (<20% in left ICA), 04/2010   CKD (chronic kidney disease) stage 2, GFR 60-89 ml/min    Pt states she does have this   Diverticula, colon    Dyspnea    Family history of breast cancer    Family history of prostate cancer    Hypercholesterolemia    Hypertension    with LVH on echo 06/03/11   Hypertonicity of bladder    Insomnia    Mitral regurgitation    Non-small cell lung cancer (NSCLC) (HCC) 07/2022   XRT comp   Osteopenia    > osteoporosis   Overactive bladder     SURGICAL HISTORY: Past Surgical History:  Procedure Laterality Date   BREAST BIOPSY     BREAST LUMPECTOMY Right    2017   BREAST LUMPECTOMY WITH RADIOACTIVE SEED LOCALIZATION Right 04/07/2015   Procedure: RIGHT BREAST LUMPECTOMY WITH RADIOACTIVE SEED LOCALIZATION;  Surgeon: Morene Olives, MD;  Location: [facility];  Service: General;  Laterality: Right;   BRONCHIAL BIOPSY   06/22/2022   Procedure: BRONCHIAL BIOPSIES;  Surgeon: [name], DO;  Location: [facility];  Service: Pulmonary;;   BRONCHIAL NEEDLE ASPIRATION BIOPSY  06/22/2022   Procedure: BRONCHIAL NEEDLE ASPIRATION BIOPSIES;  Surgeon: [name], DO;  Location: [facility];  Service: Pulmonary;;   CATARACT EXTRACTION W/ INTRAOCULAR LENS  IMPLANT, BILATERAL Bilateral    2017-2018   COLONOSCOPY  2001   2007 sigmoidoscopy     SOCIAL HISTORY: Social History   Socioeconomic History   Marital status: Single    Spouse name: Not on file   Number of children: Not on file   Years of education: Not on file   Highest education level: Not on file  Occupational History   Not on file  Tobacco Use   Smoking status: Former    Current packs/day: 0.00    Types: Cigarettes    Quit date: 02/02/1984    Years since quitting: 39.7   Smokeless  tobacco: Never  Substance and Sexual Activity   Alcohol use: Not Currently   Drug use: Not on file   Sexual activity: Never  Other Topics Concern   Not on file  Social History Narrative   Not on file   Social Drivers of Health   Financial Resource Strain: Not on file  Food Insecurity: No Food Insecurity (07/01/2022)   Hunger Vital Sign    Worried About Running Out of Food in the Last Year: Never true    Ran Out of Food in the Last Year: Never true  Transportation Needs: No Transportation Needs (07/01/2022)   PRAPARE - Administrator, Civil Service (Medical): No    Lack of Transportation (Non-Medical): No  Physical Activity: Not on file  Stress: Not on file  Social Connections: Not on file  Intimate Partner Violence: Not At Risk (07/01/2022)   Humiliation, Afraid, Rape, and Kick questionnaire    Fear of Current or Ex-Partner: No    Emotionally Abused: No    Physically Abused: No    Sexually Abused: No    FAMILY HISTORY: Family History  Problem Relation Age of Onset   Diabetes Mother    Liver cancer Mother    Breast cancer Sister  66   Prostate cancer Brother 68       metastatic   Heart attack Neg Hx    Stroke Neg Hx     Review of Systems  Constitutional:  Negative for appetite change, chills, fatigue, fever and unexpected weight change.  HENT:   Negative for hearing loss, lump/mass and trouble swallowing.   Eyes:  Negative for eye problems and icterus.  Respiratory:  Negative for chest tightness, cough and shortness of breath.   Cardiovascular:  Negative for chest pain, leg swelling and palpitations.  Gastrointestinal:  Negative for abdominal distention, abdominal pain, constipation, diarrhea, nausea and vomiting.  Endocrine: Negative for hot flashes.  Genitourinary:  Negative for difficulty urinating.   Musculoskeletal:  Negative for arthralgias.  Skin:  Negative for itching and rash.  Neurological:  Negative for dizziness, extremity weakness, headaches and numbness.  Hematological:  Negative for adenopathy. Does not bruise/bleed easily.  Psychiatric/Behavioral:  Negative for depression. The patient is not nervous/anxious.       PHYSICAL EXAMINATION   Onc Performance Status - 10/14/23 1300       KPS SCALE   KPS % SCORE Able to carry on normal activity, minor s/s of disease          Vitals:   10/14/23 1344  BP: 122/64  Pulse: 64  Resp: 16  Temp: 97.6 F (36.4 C)    Physical Exam Constitutional:      General: She is not in acute distress.    Appearance: Normal appearance. She is not toxic-appearing.  HENT:     Head: Normocephalic and atraumatic.     Mouth/Throat:     Mouth: Mucous membranes are moist.     Pharynx: Oropharynx is clear. No oropharyngeal exudate or posterior oropharyngeal erythema.  Eyes:     General: No scleral icterus. Cardiovascular:     Rate and Rhythm: Normal rate and regular rhythm.     Pulses: Normal pulses.     Heart sounds: Normal heart sounds.  Pulmonary:     Effort: Pulmonary effort is normal.     Breath sounds: Normal breath sounds.  Abdominal:      General: Abdomen is flat. Bowel sounds are normal. There is no distension.     Palpations:  Abdomen is soft.     Tenderness: There is no abdominal tenderness.  Musculoskeletal:        General: No swelling.     Cervical back: Neck supple.  Lymphadenopathy:     Cervical: No cervical adenopathy.  Skin:    General: Skin is warm and dry.     Findings: No rash.  Neurological:     General: No focal deficit present.     Mental Status: She is alert.  Psychiatric:        Mood and Affect: Mood normal.        Behavior: Behavior normal.     LABORATORY DATA:  CBC    Component Value Date/Time   WBC 6.5 10/14/2023 1305   WBC 6.7 06/22/2022 0710   RBC 4.35 10/14/2023 1305   HGB 13.3 10/14/2023 1305   HGB 13.1 10/21/2016 1334   HCT 40.0 10/14/2023 1305   HCT 39.6 10/21/2016 1334   PLT 179 10/14/2023 1305   PLT 203 10/21/2016 1334   MCV 92.0 10/14/2023 1305   MCV 86.0 10/21/2016 1334   MCH 30.6 10/14/2023 1305   MCHC 33.3 10/14/2023 1305   RDW 14.0 10/14/2023 1305   RDW 15.9 (H) 10/21/2016 1334   LYMPHSABS 1.7 10/14/2023 1305   LYMPHSABS 1.6 10/21/2016 1334   MONOABS 0.5 10/14/2023 1305   MONOABS 0.5 10/21/2016 1334   EOSABS 0.2 10/14/2023 1305   EOSABS 0.1 10/21/2016 1334   BASOSABS 0.0 10/14/2023 1305   BASOSABS 0.0 10/21/2016 1334    CMP     Component Value Date/Time   NA 144 10/14/2023 1305   NA 142 10/21/2016 1334   K 3.7 10/14/2023 1305   K 4.2 10/21/2016 1334   CL 108 10/14/2023 1305   CO2 29 10/14/2023 1305   CO2 25 10/21/2016 1334   GLUCOSE 98 10/14/2023 1305   GLUCOSE 85 10/21/2016 1334   BUN 14 10/14/2023 1305   BUN 11.5 10/21/2016 1334   CREATININE 1.25 (H) 10/14/2023 1305   CREATININE 0.9 10/21/2016 1334   CALCIUM 9.7 10/14/2023 1305   CALCIUM 9.9 10/21/2016 1334   PROT 6.6 10/14/2023 1305   PROT 7.2 10/21/2016 1334   ALBUMIN 4.1 10/14/2023 1305   ALBUMIN 4.1 10/21/2016 1334   AST 27 10/14/2023 1305   AST 19 10/21/2016 1334   ALT 20 10/14/2023 1305    ALT 10 10/21/2016 1334   ALKPHOS 54 10/14/2023 1305   ALKPHOS 49 10/21/2016 1334   BILITOT 0.6 10/14/2023 1305   BILITOT 0.76 10/21/2016 1334   GFRNONAA 41 (L) 10/14/2023 1305   GFRAA 53 (L) 10/05/2019 0947     ASSESSMENT and THERAPY PLAN:   Assessment and Plan Assessment & Plan Right lung adenocarcinoma, post-radiation, under surveillance Stage 1A2 right lung adenocarcinoma, post-radiation. Recent CT showed stable right lower lobe nodule with post-treatment changes. Left upper lobe part-solid nodule 1.1 cm, left lower lobe non-solid nodule increased to 1 cm. - Repeat CT chest on October 20, 2023. - Follow up with Dr. Izell to discuss CT results.  Unintentional weight loss, under evaluation Weight decreased from 131 to 125 pounds since March. Normal blood counts and liver function tests. Imaging to assess cancer-related causes. - Monitor weight. - Evaluate CT scan results for potential cancer-related causes of weight loss. - Follow up with primary care provider for further evaluation if needed.  Right breast invasive ductal carcinoma, post-lumpectomy, completed anastrozole  ER, PR positive carcinoma, post-lumpectomy and anastrozole . March 2025 mammogram showed no malignancy.  Osteoporosis, right femur,  on annual zoledronic  acid Osteoporosis with T-score of -2.7 in right femur. Receiving annual Zometa  infusions. - Administer Zometa  infusion today. - Continue annual Zometa  infusions.  Chronic kidney disease, stage 3b Stage 3b CKD with GFR of 41. Renal dosing considerations for osteoporosis treatment.   All questions were answered. The patient knows to call the clinic with any problems, questions or concerns. We can certainly see the patient much sooner if necessary.  Total encounter time:30 minutes*in face-to-face visit time, chart review, lab review, care coordination, order entry, and documentation of the encounter time.    Morna Kendall, NP 10/14/23 1:47 PM Medical  Oncology and Hematology [facility] [address] [place], [place] [address] Tel. [phone]    Fax. [phone]  *Total Encounter Time as defined by the Centers for Medicare and Medicaid Services includes, in addition to the face-to-face time of a patient visit (documented in the note above) non-face-to-face time: obtaining and reviewing outside history, ordering and reviewing medications, tests or procedures, care coordination (communications with other health care professionals or caregivers) and documentation in the medical record.

## 2023-10-14 NOTE — Patient Instructions (Signed)

## 2023-10-20 ENCOUNTER — Ambulatory Visit (HOSPITAL_COMMUNITY)
Admission: RE | Admit: 2023-10-20 | Discharge: 2023-10-20 | Disposition: A | Discharge status: Home / Self Care | Source: Ambulatory Visit | Attending: Radiology | Admitting: Radiology

## 2023-10-20 ENCOUNTER — Encounter

## 2023-10-20 DIAGNOSIS — C3431 Malignant neoplasm of lower lobe, right bronchus or lung: Secondary | ICD-10-CM | POA: Diagnosis not present

## 2023-10-20 DIAGNOSIS — J439 Emphysema, unspecified: Secondary | ICD-10-CM | POA: Diagnosis not present

## 2023-10-21 ENCOUNTER — Ambulatory Visit: Attending: Cardiovascular Disease

## 2023-10-21 DIAGNOSIS — Z5181 Encounter for therapeutic drug level monitoring: Secondary | ICD-10-CM | POA: Insufficient documentation

## 2023-10-21 LAB — POCT INR: INR: 2.4 (ref 2.0–3.0)

## 2023-10-21 NOTE — Patient Instructions (Signed)
 Continue taking warfarin 1.5 tablets daily except for 2 tablets on Sunday and Thursday. Recheck INR in 6 weeks.  C[facility]madin  Clinic [phone]

## 2023-10-21 NOTE — Progress Notes (Signed)
 INR 2.4 Please see anticoagulation encounter Continue taking warfarin 1.5 tablets daily except for 2 tablets on Sunday and Thursday. Recheck INR in 6 weeks.  Coumadin  Clinic [phone]

## 2023-10-25 ENCOUNTER — Ambulatory Visit: Payer: Self-pay | Admitting: Radiology

## 2023-10-25 ENCOUNTER — Ambulatory Visit: Payer: Self-pay | Admitting: Radiation Oncology

## 2023-11-01 ENCOUNTER — Ambulatory Visit
Admission: RE | Admit: 2023-11-01 | Discharge: 2023-11-01 | Disposition: A | Discharge status: Home / Self Care | Source: Ambulatory Visit | Attending: Radiology | Admitting: Radiology

## 2023-11-01 ENCOUNTER — Encounter: Payer: Self-pay | Admitting: Radiology

## 2023-11-01 VITALS — BP 134/74 | HR 73 | Temp 97.3°F | Resp 20 | Ht 68.0 in | Wt 122.0 lb

## 2023-11-01 DIAGNOSIS — Z923 Personal history of irradiation: Secondary | ICD-10-CM | POA: Insufficient documentation

## 2023-11-01 DIAGNOSIS — C3431 Malignant neoplasm of lower lobe, right bronchus or lung: Secondary | ICD-10-CM | POA: Insufficient documentation

## 2023-11-01 DIAGNOSIS — I3139 Other pericardial effusion (noninflammatory): Secondary | ICD-10-CM | POA: Insufficient documentation

## 2023-11-01 DIAGNOSIS — J432 Centrilobular emphysema: Secondary | ICD-10-CM | POA: Diagnosis not present

## 2023-11-01 DIAGNOSIS — Z79899 Other long term (current) drug therapy: Secondary | ICD-10-CM | POA: Diagnosis not present

## 2023-11-01 DIAGNOSIS — C50211 Malignant neoplasm of upper-inner quadrant of right female breast: Secondary | ICD-10-CM | POA: Insufficient documentation

## 2023-11-01 NOTE — Progress Notes (Signed)
 Martha Edwards is here today for follow up post radiation to the lung.  Lung Side: Right Lung, she completed radiation treatment on 08/18/22.  Does the patient complain of any of the following: Pain: Denies Shortness of breath w/wo exertion: Denies Cough: Denies Hemoptysis: Denies Pain with swallowing: Denies Swallowing/choking concerns: Denies Appetite: Good Energy Level: Fair Post radiation skin Changes: Denies   BP 134/74 (BP Location: Right Arm, Patient Position: Sitting, Cuff Size: Normal)   Pulse 73   Temp (!) 97.3 F (36.3 C)   Resp 20   Ht 5' 8 (1.727 m)   Wt 122 lb (55.3 kg)   SpO2 96%   BMI 18.55 kg/m

## 2023-11-01 NOTE — Progress Notes (Signed)
 Radiation Oncology         [phone] ________________________________  Name: Martha Edwards MRN: [id number]  Date: 11/01/2023  DOB: 10-07-1932  Follow-Up Visit Note  Outpatient  CC: Edwards, Alejandra, DO  Martha Adine CROME, DO  Diagnosis and Prior Radiotherapy:  No diagnosis found.    Cancer Staging  Carcinoma of upper-inner quadrant of right female breast [facility]) Staging form: Breast, AJCC 7th Edition - Pathologic stage from 04/07/2015: Stage Unknown (T1c, NX, cM0) - Unsigned Laterality: Right - Clinical: Stage IA (T1c, N0, M0) - Signed by Martha Sandria BROCKS, MD on 05/16/2015  Malignant neoplasm of lower lobe of right lung [facility]) Staging form: Lung, AJCC 8th Edition - Clinical stage from 07/02/2022: Stage IA2 (cT1b, cN0, cM0) - Signed by Martha Domino, MD on 07/03/2022 Stage prefix: Initial diagnosis  ==========DELIVERED PLANS==========  First Treatment Date: 2022-08-11 - Last Treatment Date: 2022-08-18   Plan Name: Lung_RLL_SBRT Site: Lung, Right Technique: SBRT/SRT-IMRT Mode: Photon Dose Per Fraction: 18 Gy Prescribed Dose (Delivered / Prescribed): 54 Gy / 54 Gy Prescribed Fxs (Delivered / Prescribed): 3 / 3  CHIEF COMPLAINT: Here for follow-up and surveillance of lung cancer  Narrative:  The patient returns today to review most recent CT scan of her chest.   Patient saw Martha Kendall, NP on 10/14/2023 in survivorship clinic at that time, she expressed concerns for unintentional weight, having lost  6 pounds since March. Martha recommended continuing weight monitoring.   CT of the chest on 10/20/2023 demonstrated a stable 12 mm LUL nodule; a stable 8 mm RLL nodule; a stable 10 mm LLL nodule; a stable 9 mm nodule; and moderate emphysema.   ALLERGIES:  has no known allergies.  Meds: Current Outpatient Medications  Medication Sig Dispense Refill   acetaminophen  (TYLENOL ) 325 MG tablet Take 650 mg by mouth every 6 (six) hours as needed for mild pain.     amLODipine   (NORVASC ) 5 MG tablet Take 1 tablet (5 mg total) by mouth daily. 90 tablet 3   atorvastatin (LIPITOR) 40 MG tablet Take 40 mg by mouth daily.     CALCIUM PO Take 1 tablet by mouth 2 (two) times daily.     Cholecalciferol (VITAMIN D3) 25 MCG (1000 UT) CAPS Take 1,000 Units by mouth daily.     Multiple Vitamins-Minerals (CENTRUM SILVER) tablet Take 1 tablet by mouth daily.     warfarin (COUMADIN ) 4 MG tablet TAKE AS DIRECTED BY COUMADIN  CLINIC-90 DAY SUPPLY 150 tablet 1   No current facility-administered medications for this visit.    Physical Findings:  The patient is in no acute distress. Patient is alert and oriented.  vitals were not taken for this visit. Martha Edwards    Heart: RRR Lungs CTAB Lymphatics: No palpable cervical, supraclavicular, or axillary adenopathy MSK: Ambulatory Neuro non focal, fluent speech Psych: pleasant affect, alert, conversant  Lab Findings: Lab Results  Component Value Date   WBC 6.5 10/14/2023   HGB 13.3 10/14/2023   HCT 40.0 10/14/2023   MCV 92.0 10/14/2023   PLT 179 10/14/2023    Radiographic Findings: CT CHEST WO CONTRAST Result Date: 10/26/2023 EXAM: CT CHEST WITHOUT CONTRAST 10/20/2023 01:38:10 PM TECHNIQUE: CT of the chest was performed without the administration of intravenous contrast. Multiplanar reformatted images are provided for review. Automated exposure control, iterative reconstruction, and/or weight based adjustment of the mA/kV was utilized to reduce the radiation dose to as low as reasonably achievable. COMPARISON: Comparison examinations include a CT chest dated 04/08/2023 and a PET/CT examination  dated 11/23/2022. CLINICAL HISTORY: Non-small cell lung cancer (NSCLC), monitor. Non-small cell lung cancer (NSCLC), monitor, Malignant neoplasm of lower lobe of right lung (HCC). *tracking code: Bo* FINDINGS: MEDIASTINUM: Extensive multivessel coronary artery calcifications. Extensive calcification of the aortic valve leaflets. Cardiac size is mildly  enlarged. Stable trace pericardial effusion. LYMPH NODES: No pathologic thoracic adenopathy. LUNGS AND PLEURA: Moderate emphysema. A part solid nodule within the left upper lobe, image 2/7, is stable measuring 12 mm in overall dimension with a 5 mm solid component. An 8 mm subsolid subpleural nodule within the right lower lobe, image 74/7, is stable since the prior examination when accounting for changes in slice selection. A subsolid 10 mm nodule within the left lower lobe at image 77/7 is stable since the prior examination. An area of reticulation, architectural distortion, and traction bronchiolectasis is again seen at the right lung base crossing the major pleura, possibly representing posttreatment change. Within this region is a nodular density measuring 8x9 mm at image 111/7, which appears stable since the prior examination. No new focal pulmonary nodules or infiltrates. No pneumothorax or pleural effusion. No central obstructing lesion. SOFT TISSUES/BONES: Osseous structures are age appropriate. No acute bone abnormality. No lytic or blastic bone lesion. Postsurgical changes are seen within the right breast. UPPER ABDOMEN: Limited images of the upper abdomen demonstrate no acute abnormality. Extensive atherosclerotic calcification within the thoracic aorta. No aortic aneurysm. Appear encouraged in the right subclavian artery. IMPRESSION: 1. Stable disease. 2. Stable 12 mm part solid nodule within the left upper lobe with a 5 mm solid component. 3. Stable 8 mm subsolid subpleural nodule within the right lower lobe. 4. Stable 10 mm subsolid nodule within the left lower lobe. 5. Stable 8 x 9 mm nodular density within an area of reticulation, architectural distortion, and traction bronchiolectasis at the right lung base, possibly representing posttreatment change. 6. Moderate emphysema. Electronically signed by: Martha Molt MD 10/26/2023 11:43 PM EDT RP Workstation: [id number]    Impression/Plan:  Stage IA2  ( cT1b, cN0, M0) NSCLC, adenocarcinoma, of the right lower lobe; s/p SBRT completed on 08/18/2022.   Dr. Izell personally reviewed her imaging and I discussed her CT results with her today. Imaging demonstrates stable disease with no evidence of disease progression. Dr. Izell recommends follow-up CT of the chest in 6 months. ***  We again discussed ways to boost caloric intake - she will continue to push Ensure plus or Boost with extra calories. ***  CT follow-up in 6 months ***  On date of service, in total, I spent 20 minutes on this encounter. Patient was seen in person.  ____________________________________    Leeroy Due, PA-C

## 2023-11-02 ENCOUNTER — Other Ambulatory Visit: Payer: Self-pay | Admitting: Radiology

## 2023-11-02 DIAGNOSIS — C3431 Malignant neoplasm of lower lobe, right bronchus or lung: Secondary | ICD-10-CM

## 2023-11-03 ENCOUNTER — Encounter: Payer: Self-pay | Admitting: Adult Health

## 2023-11-07 DIAGNOSIS — Z23 Encounter for immunization: Secondary | ICD-10-CM | POA: Diagnosis not present

## 2023-11-23 DIAGNOSIS — Z23 Encounter for immunization: Secondary | ICD-10-CM | POA: Diagnosis not present

## 2023-12-02 ENCOUNTER — Other Ambulatory Visit: Payer: Self-pay

## 2023-12-02 ENCOUNTER — Encounter: Payer: Self-pay | Admitting: Adult Health

## 2023-12-02 ENCOUNTER — Ambulatory Visit: Attending: Cardiovascular Disease

## 2023-12-02 DIAGNOSIS — I480 Paroxysmal atrial fibrillation: Secondary | ICD-10-CM

## 2023-12-02 DIAGNOSIS — Z5181 Encounter for therapeutic drug level monitoring: Secondary | ICD-10-CM | POA: Diagnosis not present

## 2023-12-02 LAB — POCT INR: INR: 2.5 (ref 2.0–3.0)

## 2023-12-02 MED ORDER — WARFARIN SODIUM 4 MG PO TABS
ORAL_TABLET | ORAL | 1 refills | Status: AC
Start: 2023-12-02 — End: ?

## 2023-12-02 NOTE — Patient Instructions (Signed)
 Continue taking warfarin 1.5 tablets daily except for 2 tablets on Sunday and Thursday. Recheck INR in 6 weeks.  C[facility]madin  Clinic [phone]

## 2023-12-02 NOTE — Progress Notes (Signed)
 INR 2.5 Please see anticoagulation encounter Continue taking warfarin 1.5 tablets daily except for 2 tablets on Sunday and Thursday. Recheck INR in 6 weeks.  Coumadin  Clinic [phone]

## 2023-12-09 DIAGNOSIS — H348332 Tributary (branch) retinal vein occlusion, bilateral, stable: Secondary | ICD-10-CM | POA: Diagnosis not present

## 2023-12-09 DIAGNOSIS — H52202 Unspecified astigmatism, left eye: Secondary | ICD-10-CM | POA: Diagnosis not present

## 2023-12-09 DIAGNOSIS — H26491 Other secondary cataract, right eye: Secondary | ICD-10-CM | POA: Diagnosis not present

## 2023-12-09 DIAGNOSIS — Z961 Presence of intraocular lens: Secondary | ICD-10-CM | POA: Diagnosis not present

## 2023-12-09 DIAGNOSIS — H35373 Puckering of macula, bilateral: Secondary | ICD-10-CM | POA: Diagnosis not present

## 2023-12-21 DIAGNOSIS — M8580 Other specified disorders of bone density and structure, unspecified site: Secondary | ICD-10-CM | POA: Diagnosis not present

## 2023-12-21 DIAGNOSIS — I4891 Unspecified atrial fibrillation: Secondary | ICD-10-CM | POA: Diagnosis not present

## 2023-12-21 DIAGNOSIS — Z23 Encounter for immunization: Secondary | ICD-10-CM | POA: Diagnosis not present

## 2023-12-21 DIAGNOSIS — I10 Essential (primary) hypertension: Secondary | ICD-10-CM | POA: Diagnosis not present

## 2023-12-21 DIAGNOSIS — Z780 Asymptomatic menopausal state: Secondary | ICD-10-CM | POA: Diagnosis not present

## 2023-12-21 DIAGNOSIS — E785 Hyperlipidemia, unspecified: Secondary | ICD-10-CM | POA: Diagnosis not present

## 2023-12-21 DIAGNOSIS — Z0000 Encounter for general adult medical examination without abnormal findings: Secondary | ICD-10-CM | POA: Diagnosis not present

## 2023-12-27 ENCOUNTER — Other Ambulatory Visit (HOSPITAL_BASED_OUTPATIENT_CLINIC_OR_DEPARTMENT_OTHER): Payer: Self-pay | Admitting: Family Medicine

## 2023-12-27 DIAGNOSIS — Z780 Asymptomatic menopausal state: Secondary | ICD-10-CM

## 2024-01-09 ENCOUNTER — Ambulatory Visit (HOSPITAL_COMMUNITY)
Admission: RE | Admit: 2024-01-09 | Discharge: 2024-01-09 | Attending: Cardiovascular Disease | Admitting: Cardiovascular Disease

## 2024-01-09 ENCOUNTER — Encounter: Payer: Self-pay | Admitting: Adult Health

## 2024-01-09 DIAGNOSIS — I350 Nonrheumatic aortic (valve) stenosis: Secondary | ICD-10-CM

## 2024-01-10 ENCOUNTER — Ambulatory Visit: Payer: Self-pay | Admitting: Cardiovascular Disease

## 2024-01-10 LAB — ECHOCARDIOGRAM COMPLETE
AR max vel: 0.76 cm2
AV Area VTI: 0.68 cm2
AV Area mean vel: 0.74 cm2
AV Mean grad: 27.5 mmHg
AV Peak grad: 48.5 mmHg
Ao pk vel: 3.48 m/s
S' Lateral: 2.37 cm

## 2024-01-16 ENCOUNTER — Ambulatory Visit: Attending: Cardiovascular Disease | Admitting: Cardiovascular Disease

## 2024-01-16 ENCOUNTER — Ambulatory Visit (INDEPENDENT_AMBULATORY_CARE_PROVIDER_SITE_OTHER)

## 2024-01-16 ENCOUNTER — Encounter: Payer: Self-pay | Admitting: Cardiovascular Disease

## 2024-01-16 VITALS — BP 116/70 | HR 66 | Ht 68.0 in | Wt 123.0 lb

## 2024-01-16 DIAGNOSIS — I480 Paroxysmal atrial fibrillation: Secondary | ICD-10-CM | POA: Insufficient documentation

## 2024-01-16 DIAGNOSIS — Z5181 Encounter for therapeutic drug level monitoring: Secondary | ICD-10-CM

## 2024-01-16 DIAGNOSIS — E785 Hyperlipidemia, unspecified: Secondary | ICD-10-CM | POA: Insufficient documentation

## 2024-01-16 DIAGNOSIS — I350 Nonrheumatic aortic (valve) stenosis: Secondary | ICD-10-CM | POA: Diagnosis not present

## 2024-01-16 DIAGNOSIS — I10 Essential (primary) hypertension: Secondary | ICD-10-CM | POA: Diagnosis present

## 2024-01-16 LAB — POCT INR: INR: 3 (ref 2.0–3.0)

## 2024-01-16 NOTE — Patient Instructions (Addendum)
°  °  Description   INR 3.0, Continue taking warfarin 1.5 tablets daily except for 2 tablets on Sunday and Thursday. Recheck INR in 6 weeks.  Coumadin  Clinic [phone]

## 2024-01-16 NOTE — Progress Notes (Unsigned)
 Cardiology Office Note:    Date:  01/16/2024   ID:  [name], DOB July 27, 1932, MRN [id number]  PCP:  Chet Mad, DO   [facility] Providers Cardiologist:  None { Click to update primary MD,subspecialty MD or APP then REFRESH:1}    Referring MD: Espinoza, Alejandra, DO   No chief complaint on file. ***  History of Present Illness:    Martha Edwards is a 89 y.o. female with a hx of aortic stenosis presenting for follow-up evaluation.  The patient's comorbid conditions include advanced age and right lung adenocarcinoma status post radiation therapy under surveillance.  Her most recent CT scan of the chest showed stable disease.  She had previously been noted to have severe paradoxical low-flow low gradient aortic stenosis and I had recommended ongoing surveillance in the setting of her lung cancer and asymptomatic status.  Her recent surveillance echocardiogram 01/09/2024 showed normal LVEF 60 to 65%, normal RV function, severe biatrial dilatation, moderate to severe mitral annular calcification without mitral stenosis or significant mitral regurgitation, and severe low-flow low gradient aortic stenosis with peak and mean transvalvular gradients of 49 and 28 mmHg, respectively, dimensionless index of 0.21, and a calculated aortic valve area by VTI of 0.68 cm with a low stroke-volume index of 34.  Recent labs reviewed with a normal CBC and a creatinine of 1.25.   Current Medications: Active Medications[1]   Allergies:   Trazodone   ROS:   Please see the history of present illness.    *** All other systems reviewed and are negative.  EKGs/Labs/Other Studies Reviewed:    The following studies were reviewed today: Cardiac Studies & Procedures   ______________________________________________________________________________________________     ECHOCARDIOGRAM  ECHOCARDIOGRAM COMPLETE 01/09/2024  Narrative ECHOCARDIOGRAM REPORT    Patient Name:   Martha Edwards  Sherk Date of Exam: 01/09/2024 Medical Rec #:  [id number]      Height:       68.0 in Accession #:    [id number]     Weight:       122.0 lb Date of Birth:  Mar 14, 1932      BSA:          1.657 m Patient Age:    89 years       BP:           137/74 mmHg Patient Gender: F              HR:           56 bpm. Exam Location:  [address]  Procedure: 2D Echo (Both Spectral and Color Flow Doppler were utilized during procedure).  Indications:    I35.0 Nonrheumatic aortic (valve) stenosis  History:        Patient has prior history of Echocardiogram examinations, most recent 06/29/2023. Arrythmias:Atrial Flutter; Risk Factors:Hypertension. Tachy-brady syndrome.  Sonographer:    Jon Hacker RCS Referring Phys: [phone] Jhalil Silvera  IMPRESSIONS   1. Left ventricular ejection fraction, by estimation, is 60 to 65%. The left ventricle has normal function. The left ventricle has no regional wall motion abnormalities. Left ventricular diastolic parameters are indeterminate. 2. Right ventricular systolic function is normal. The right ventricular size is normal. There is moderately elevated pulmonary artery systolic pressure. The estimated right ventricular systolic pressure is 46.5 mmHg. 3. Left atrial size was severely dilated. 4. Right atrial size was severely dilated. 5. The mitral valve is rheumatic. Trivial mitral valve regurgitation. No evidence of mitral stenosis. Moderate to severe mitral annular calcification.  6. Tricuspid valve regurgitation is moderate to severe. 7. Severe LF/LG aortic stenosis DI 0.24. The aortic valve is tricuspid. There is severe calcifcation of the aortic valve. Aortic valve regurgitation is mild. No aortic stenosis is present. Aortic valve area, by VTI measures 0.68 cm. Aortic valve mean gradient measures 27.5 mmHg. Aortic valve Vmax measures 3.48 m/s. 8. The inferior vena cava is normal in size with greater than 50% respiratory variability, suggesting right atrial  pressure of 3 mmHg.  FINDINGS Left Ventricle: Left ventricular ejection fraction, by estimation, is 60 to 65%. The left ventricle has normal function. The left ventricle has no regional wall motion abnormalities. The left ventricular internal cavity size was normal in size. There is no left ventricular hypertrophy. Left ventricular diastolic parameters are indeterminate.  Right Ventricle: The right ventricular size is normal. No increase in right ventricular wall thickness. Right ventricular systolic function is normal. There is moderately elevated pulmonary artery systolic pressure. The tricuspid regurgitant velocity is 3.22 m/s, and with an assumed right atrial pressure of 5 mmHg, the estimated right ventricular systolic pressure is 46.5 mmHg.  Left Atrium: Left atrial size was severely dilated.  Right Atrium: Right atrial size was severely dilated.  Pericardium: There is no evidence of pericardial effusion.  Mitral Valve: The mitral valve is rheumatic. There is moderate thickening of the mitral valve leaflet(s). Moderate to severe mitral annular calcification. Trivial mitral valve regurgitation. No evidence of mitral valve stenosis.  Tricuspid Valve: The tricuspid valve is normal in structure. Tricuspid valve regurgitation is moderate to severe. No evidence of tricuspid stenosis.  Aortic Valve: Severe LF/LG aortic stenosis DI 0.24. The aortic valve is tricuspid. There is severe calcifcation of the aortic valve. Aortic valve regurgitation is mild. No aortic stenosis is present. Aortic valve mean gradient measures 27.5 mmHg. Aortic valve peak gradient measures 48.5 mmHg. Aortic valve area, by VTI measures 0.68 cm.  Pulmonic Valve: The pulmonic valve was normal in structure. Pulmonic valve regurgitation is mild to moderate. No evidence of pulmonic stenosis.  Aorta: The aortic root is normal in size and structure.  Venous: The inferior vena cava is normal in size with greater than 50%  respiratory variability, suggesting right atrial pressure of 3 mmHg.  IAS/Shunts: The interatrial septum is aneurysmal. No atrial level shunt detected by color flow Doppler.   LEFT VENTRICLE PLAX 2D LVIDd:         3.47 cm LVIDs:         2.37 cm LV PW:         1.01 cm LV IVS:        0.86 cm LVOT diam:     2.00 cm LV SV:         56 LV SV Index:   34 LVOT Area:     3.14 cm   RIGHT VENTRICLE RV Basal diam:  2.39 cm TAPSE (M-mode): 1.4 cm RVSP:           44.5 mmHg  LEFT ATRIUM           Index        RIGHT ATRIUM           Index LA diam:      3.00 cm 1.81 cm/m   RA Pressure: 3.00 mmHg LA Vol (A2C): 56.1 ml 33.87 ml/m  RA Area:     19.30 cm LA Vol (A4C): 54.9 ml 33.14 ml/m  RA Volume:   54.20 ml  32.72 ml/m AORTIC VALVE AV Area (Vmax):  0.76 cm AV Area (Vmean):   0.74 cm AV Area (VTI):     0.68 cm AV Vmax:           348.25 cm/s AV Vmean:          243.000 cm/s AV VTI:            0.834 m AV Peak Grad:      48.5 mmHg AV Mean Grad:      27.5 mmHg LVOT Vmax:         84.60 cm/s LVOT Vmean:        57.367 cm/s LVOT VTI:          0.179 m LVOT/AV VTI ratio: 0.21  AORTA Ao Root diam: 2.60 cm Ao Asc diam:  2.60 cm  TRICUSPID VALVE TR Peak grad:   41.5 mmHg TR Vmax:        322.00 cm/s Estimated RAP:  3.00 mmHg RVSP:           44.5 mmHg  SHUNTS Systemic VTI:  0.18 m Systemic Diam: 2.00 cm  Toribio Fuel MD Electronically signed by Toribio Fuel MD Signature Date/Time: 01/10/2024/12:23:03 AM    Final          ______________________________________________________________________________________________      EKG:        Recent Labs: 10/14/2023: ALT 20; BUN 14; Creatinine 1.25; Hemoglobin 13.3; Platelet Count 179; Potassium 3.7; Sodium 144  Recent Lipid Panel No results found for: CHOL, TRIG, HDL, CHOLHDL, VLDL, LDLCALC, LDLDIRECT   Risk Assessment/Calculations:   {Does this patient have ATRIAL FIBRILLATION?:[phone]}             Physical Exam:    VS:  BP 116/70   Pulse 66   Ht 5' 8 (1.727 m)   Wt 123 lb (55.8 kg)   SpO2 96%   BMI 18.70 kg/m     Wt Readings from Last 3 Encounters:  01/16/24 123 lb (55.8 kg)  11/01/23 122 lb (55.3 kg)  10/14/23 125 lb 8 oz (56.9 kg)     GEN: *** Well nourished, well developed in no acute distress HEENT: Normal NECK: No JVD; No carotid bruits LYMPHATICS: No lymphadenopathy CARDIAC: ***RRR, no murmurs, rubs, gallops RESPIRATORY:  Clear to auscultation without rales, wheezing or rhonchi  ABDOMEN: Soft, non-tender, non-distended MUSCULOSKELETAL:  No edema; No deformity  SKIN: Warm and dry NEUROLOGIC:  Alert and oriented x 3 PSYCHIATRIC:  Normal affect   Assessment & Plan        {Are you ordering a CV Procedure (e.g. stress test, cath, DCCV, TEE, etc)?   Press F2        :[id number]}    Medication Adjustments/Labs and Tests Ordered: Current medicines are reviewed at length with the patient today.  Concerns regarding medicines are outlined above.  No orders of the defined types were placed in this encounter.  No orders of the defined types were placed in this encounter.   There are no Patient Instructions on file for this visit.   Signed, Ozell Fell, MD  01/16/2024 9:43 AM    [facility]    [1]  Current Meds  Medication Sig   acetaminophen  (TYLENOL ) 325 MG tablet Take 650 mg by mouth every 6 (six) hours as needed for mild pain.   amLODipine  (NORVASC ) 5 MG tablet Take 1 tablet (5 mg total) by mouth daily.   atorvastatin (LIPITOR) 40 MG tablet Take 40 mg by mouth daily.   CALCIUM PO Take 1 tablet by mouth 2 (two)  times daily.   Cholecalciferol (VITAMIN D3) 25 MCG (1000 UT) CAPS Take 1,000 Units by mouth daily.   Multiple Vitamins-Minerals (CENTRUM SILVER) tablet Take 1 tablet by mouth daily.   warfarin (COUMADIN ) 4 MG tablet TAKE AS DIRECTED BY COUMADIN  CLINIC-90 DAY SUPPLY

## 2024-01-16 NOTE — Progress Notes (Signed)
 Description   INR 3.0, Continue taking warfarin 1.5 tablets daily except for 2 tablets on Sunday and Thursday. Recheck INR in 6 weeks.  Coumadin  Clinic [phone]

## 2024-01-16 NOTE — Patient Instructions (Signed)
 Medication Instructions:  No medication changes were made at this visit. Continue current regimen.   *If you need a refill on your cardiac medications before your next appointment, please call your pharmacy*  Lab Work: None ordered today. If you have labs (blood work) drawn today and your tests are completely normal, you will receive your results only by: MyChart Message (if you have MyChart) OR A paper copy in the mail If you have any lab test that is abnormal or we need to change your treatment, we will call you to review the results.  Testing/Procedures: Your physician has requested that you have an echocardiogram prior to 6 month follow-up appointment. Echocardiography is a painless test that uses sound waves to create images of your heart. It provides your doctor with information about the size and shape of your heart and how well your heart's chambers and valves are working. This procedure takes approximately one hour. There are no restrictions for this procedure. Please do NOT wear cologne, perfume, aftershave, or lotions (deodorant is allowed). Please arrive 15 minutes prior to your appointment time.  Please note: We ask at that you not bring children with you during ultrasound (echo/ vascular) testing. Due to room size and safety concerns, children are not allowed in the ultrasound rooms during exams. Our front office staff cannot provide observation of children in our lobby area while testing is being conducted. An adult accompanying a patient to their appointment will only be allowed in the ultrasound room at the discretion of the ultrasound technician under special circumstances. We apologize for any inconvenience.   Follow-Up: At [facility], you and your health needs are our priority.  As part of our continuing mission to provide you with exceptional heart care, our providers are all part of one team.  This team includes your primary Cardiologist (physician) and Advanced  Practice Providers or APPs (Physician Assistants and Nurse Practitioners) who all work together to provide you with the care you need, when you need it.  Your next appointment:   6 month(s)  Provider:   Ozell Fell, MD

## 2024-01-21 NOTE — Assessment & Plan Note (Signed)
 BP well-controlled on amlodipine.

## 2024-02-27 ENCOUNTER — Ambulatory Visit

## 2024-03-05 ENCOUNTER — Ambulatory Visit

## 2024-03-12 ENCOUNTER — Ambulatory Visit

## 2024-04-10 ENCOUNTER — Encounter: Admitting: Adult Health

## 2024-04-10 ENCOUNTER — Inpatient Hospital Stay

## 2024-05-08 ENCOUNTER — Ambulatory Visit: Payer: Self-pay | Admitting: Radiology

## 2024-07-17 ENCOUNTER — Ambulatory Visit (HOSPITAL_COMMUNITY)

## 2024-07-19 ENCOUNTER — Ambulatory Visit: Admitting: Cardiovascular Disease
